# Patient Record
Sex: Female | Born: 1950 | Race: White | Hispanic: No | Smoking: Current some day smoker
Health system: Southern US, Community
[De-identification: ages and names within clinical notes are randomized; demographics above are authoritative.]

## PROBLEM LIST (undated history)

## (undated) DIAGNOSIS — K5792 Diverticulitis of intestine, part unspecified, without perforation or abscess without bleeding: Secondary | ICD-10-CM

## (undated) DIAGNOSIS — I255 Ischemic cardiomyopathy: Secondary | ICD-10-CM

## (undated) DIAGNOSIS — G4700 Insomnia, unspecified: Secondary | ICD-10-CM

## (undated) DIAGNOSIS — I714 Abdominal aortic aneurysm, without rupture: Secondary | ICD-10-CM

## (undated) DIAGNOSIS — Z720 Tobacco use: Secondary | ICD-10-CM

## (undated) DIAGNOSIS — R569 Unspecified convulsions: Secondary | ICD-10-CM

## (undated) DIAGNOSIS — E785 Hyperlipidemia, unspecified: Secondary | ICD-10-CM

## (undated) DIAGNOSIS — N289 Disorder of kidney and ureter, unspecified: Secondary | ICD-10-CM

## (undated) DIAGNOSIS — I10 Essential (primary) hypertension: Secondary | ICD-10-CM

## (undated) DIAGNOSIS — K769 Liver disease, unspecified: Secondary | ICD-10-CM

## (undated) DIAGNOSIS — K922 Gastrointestinal hemorrhage, unspecified: Secondary | ICD-10-CM

## (undated) DIAGNOSIS — M1990 Unspecified osteoarthritis, unspecified site: Secondary | ICD-10-CM

## (undated) DIAGNOSIS — F32A Depression, unspecified: Secondary | ICD-10-CM

## (undated) DIAGNOSIS — F419 Anxiety disorder, unspecified: Secondary | ICD-10-CM

## (undated) DIAGNOSIS — K589 Irritable bowel syndrome without diarrhea: Secondary | ICD-10-CM

## (undated) DIAGNOSIS — L309 Dermatitis, unspecified: Secondary | ICD-10-CM

## (undated) DIAGNOSIS — K219 Gastro-esophageal reflux disease without esophagitis: Principal | ICD-10-CM

## (undated) DIAGNOSIS — F329 Major depressive disorder, single episode, unspecified: Secondary | ICD-10-CM

## (undated) DIAGNOSIS — I2510 Atherosclerotic heart disease of native coronary artery without angina pectoris: Secondary | ICD-10-CM

## (undated) DIAGNOSIS — F17200 Nicotine dependence, unspecified, uncomplicated: Secondary | ICD-10-CM

## (undated) HISTORY — DX: Hyperlipidemia, unspecified: E78.5

## (undated) HISTORY — DX: Gastro-esophageal reflux disease without esophagitis: K21.9

## (undated) HISTORY — PX: CARDIAC CATHETERIZATION: SHX172

## (undated) HISTORY — PX: COLONOSCOPY: SHX174

## (undated) HISTORY — PX: ABDOMINAL HYSTERECTOMY: SHX81

## (undated) HISTORY — DX: Nicotine dependence, unspecified, uncomplicated: F17.200

## (undated) SURGERY — LEFT HEART CATHETERIZATION WITH CORONARY ANGIOGRAM
Anesthesia: Moderate Sedation

---

## 2011-05-06 DIAGNOSIS — I509 Heart failure, unspecified: Secondary | ICD-10-CM

## 2011-05-06 DIAGNOSIS — R0602 Shortness of breath: Secondary | ICD-10-CM

## 2011-05-07 DIAGNOSIS — I5021 Acute systolic (congestive) heart failure: Secondary | ICD-10-CM

## 2011-05-10 DIAGNOSIS — R079 Chest pain, unspecified: Secondary | ICD-10-CM

## 2011-05-11 DIAGNOSIS — I219 Acute myocardial infarction, unspecified: Secondary | ICD-10-CM

## 2011-05-12 ENCOUNTER — Encounter (HOSPITAL_COMMUNITY)
Admission: RE | Disposition: A | Discharge status: Home / Self Care | Payer: Self-pay | Source: Ambulatory Visit | Attending: Cardiovascular Disease

## 2011-05-12 ENCOUNTER — Inpatient Hospital Stay (HOSPITAL_COMMUNITY)
Admission: RE | Admit: 2011-05-12 | Discharge: 2011-05-14 | DRG: 248 | Disposition: A | Discharge status: Home / Self Care | Payer: Medicaid Other | Source: Ambulatory Visit | Attending: Cardiovascular Disease | Admitting: Cardiovascular Disease

## 2011-05-12 ENCOUNTER — Encounter (HOSPITAL_COMMUNITY): Payer: Self-pay

## 2011-05-12 ENCOUNTER — Inpatient Hospital Stay
Admission: RE | Admit: 2011-05-12 | Payer: Self-pay | Source: Other Acute Inpatient Hospital | Admitting: Cardiovascular Disease

## 2011-05-12 DIAGNOSIS — I5021 Acute systolic (congestive) heart failure: Secondary | ICD-10-CM | POA: Diagnosis present

## 2011-05-12 DIAGNOSIS — I2510 Atherosclerotic heart disease of native coronary artery without angina pectoris: Principal | ICD-10-CM | POA: Insufficient documentation

## 2011-05-12 DIAGNOSIS — E785 Hyperlipidemia, unspecified: Secondary | ICD-10-CM | POA: Diagnosis present

## 2011-05-12 DIAGNOSIS — K909 Intestinal malabsorption, unspecified: Secondary | ICD-10-CM | POA: Diagnosis present

## 2011-05-12 DIAGNOSIS — K589 Irritable bowel syndrome without diarrhea: Secondary | ICD-10-CM | POA: Diagnosis present

## 2011-05-12 DIAGNOSIS — F17200 Nicotine dependence, unspecified, uncomplicated: Secondary | ICD-10-CM | POA: Diagnosis present

## 2011-05-12 DIAGNOSIS — L259 Unspecified contact dermatitis, unspecified cause: Secondary | ICD-10-CM | POA: Diagnosis present

## 2011-05-12 DIAGNOSIS — I509 Heart failure, unspecified: Secondary | ICD-10-CM | POA: Insufficient documentation

## 2011-05-12 DIAGNOSIS — G4700 Insomnia, unspecified: Secondary | ICD-10-CM | POA: Diagnosis present

## 2011-05-12 DIAGNOSIS — K5900 Constipation, unspecified: Secondary | ICD-10-CM | POA: Diagnosis present

## 2011-05-12 DIAGNOSIS — M1990 Unspecified osteoarthritis, unspecified site: Secondary | ICD-10-CM | POA: Diagnosis present

## 2011-05-12 DIAGNOSIS — I200 Unstable angina: Secondary | ICD-10-CM

## 2011-05-12 DIAGNOSIS — F341 Dysthymic disorder: Secondary | ICD-10-CM | POA: Diagnosis present

## 2011-05-12 DIAGNOSIS — I10 Essential (primary) hypertension: Secondary | ICD-10-CM | POA: Diagnosis present

## 2011-05-12 DIAGNOSIS — M109 Gout, unspecified: Secondary | ICD-10-CM | POA: Diagnosis present

## 2011-05-12 HISTORY — PX: LEFT HEART CATHETERIZATION WITH CORONARY ANGIOGRAM: SHX5451

## 2011-05-12 HISTORY — DX: Dermatitis, unspecified: L30.9

## 2011-05-12 HISTORY — DX: Gastrointestinal hemorrhage, unspecified: K92.2

## 2011-05-12 HISTORY — DX: Major depressive disorder, single episode, unspecified: F32.9

## 2011-05-12 HISTORY — DX: Anxiety disorder, unspecified: F41.9

## 2011-05-12 HISTORY — DX: Irritable bowel syndrome, unspecified: K58.9

## 2011-05-12 HISTORY — DX: Insomnia, unspecified: G47.00

## 2011-05-12 HISTORY — DX: Atherosclerotic heart disease of native coronary artery without angina pectoris: I25.10

## 2011-05-12 HISTORY — DX: Unspecified osteoarthritis, unspecified site: M19.90

## 2011-05-12 HISTORY — DX: Diverticulitis of intestine, part unspecified, without perforation or abscess without bleeding: K57.92

## 2011-05-12 HISTORY — DX: Depression, unspecified: F32.A

## 2011-05-12 HISTORY — DX: Essential (primary) hypertension: I10

## 2011-05-12 SURGERY — LEFT HEART CATHETERIZATION WITH CORONARY ANGIOGRAM
Anesthesia: LOCAL

## 2011-05-12 MED ORDER — DIAZEPAM 5 MG PO TABS
ORAL_TABLET | ORAL | Status: AC
  Filled 2011-05-12: qty 1

## 2011-05-12 MED ORDER — SODIUM CHLORIDE 0.9 % IJ SOLN
3.0000 mL | Freq: Two times a day (BID) | INTRAMUSCULAR | Status: DC
  Administered 2011-05-12 – 2011-05-13 (×3): 3 mL via INTRAVENOUS

## 2011-05-12 MED ORDER — NITROGLYCERIN 0.2 MG/ML ON CALL CATH LAB
INTRAVENOUS | Status: AC
  Filled 2011-05-12: qty 1

## 2011-05-12 MED ORDER — FUROSEMIDE 20 MG PO TABS
20.0000 mg | ORAL_TABLET | Freq: Every day | ORAL | Status: DC
  Administered 2011-05-13: 20 mg via ORAL
  Filled 2011-05-12 (×2): qty 1

## 2011-05-12 MED ORDER — ATORVASTATIN CALCIUM 40 MG PO TABS
40.0000 mg | ORAL_TABLET | Freq: Every day | ORAL | Status: DC
  Administered 2011-05-12 – 2011-05-13 (×2): 40 mg via ORAL
  Filled 2011-05-12 (×3): qty 1

## 2011-05-12 MED ORDER — SODIUM CHLORIDE 0.9 % IV SOLN: 0.2500 mg/kg/h | INTRAVENOUS | Status: DC

## 2011-05-12 MED ORDER — DIAZEPAM 5 MG PO TABS
5.0000 mg | ORAL_TABLET | Freq: Once | ORAL | Status: AC
  Administered 2011-05-12: 5 mg via ORAL

## 2011-05-12 MED ORDER — ZOLPIDEM TARTRATE 5 MG PO TABS
5.0000 mg | ORAL_TABLET | Freq: Every evening | ORAL | Status: DC | PRN
  Administered 2011-05-12 – 2011-05-13 (×2): 5 mg via ORAL
  Filled 2011-05-12 (×2): qty 1

## 2011-05-12 MED ORDER — HEPARIN (PORCINE) IN NACL 2-0.9 UNIT/ML-% IJ SOLN
INTRAMUSCULAR | Status: AC
  Filled 2011-05-12: qty 2000

## 2011-05-12 MED ORDER — CLOPIDOGREL BISULFATE 300 MG PO TABS
ORAL_TABLET | ORAL | Status: AC
  Filled 2011-05-12: qty 2

## 2011-05-12 MED ORDER — BIVALIRUDIN 250 MG IV SOLR
INTRAVENOUS | Status: AC
  Filled 2011-05-12: qty 250

## 2011-05-12 MED ORDER — CARVEDILOL 3.125 MG PO TABS
3.1250 mg | ORAL_TABLET | Freq: Two times a day (BID) | ORAL | Status: DC
  Administered 2011-05-12 – 2011-05-13 (×3): 3.125 mg via ORAL
  Filled 2011-05-12 (×6): qty 1

## 2011-05-12 MED ORDER — ONDANSETRON HCL 4 MG/2ML IJ SOLN: 4.0000 mg | Freq: Four times a day (QID) | INTRAMUSCULAR | Status: DC | PRN

## 2011-05-12 MED ORDER — CLOPIDOGREL BISULFATE 75 MG PO TABS
75.0000 mg | ORAL_TABLET | Freq: Every day | ORAL | Status: DC
  Administered 2011-05-13: 75 mg via ORAL
  Filled 2011-05-12: qty 1

## 2011-05-12 MED ORDER — NICOTINE 21 MG/24HR TD PT24
21.0000 mg | MEDICATED_PATCH | Freq: Every day | TRANSDERMAL | Status: DC
  Administered 2011-05-12 – 2011-05-13 (×2): 21 mg via TRANSDERMAL
  Filled 2011-05-12 (×3): qty 1

## 2011-05-12 MED ORDER — FLUOXETINE HCL 20 MG PO CAPS
20.0000 mg | ORAL_CAPSULE | Freq: Every day | ORAL | Status: DC
  Administered 2011-05-13: 20 mg via ORAL
  Filled 2011-05-12 (×2): qty 1

## 2011-05-12 MED ORDER — OXYCODONE-ACETAMINOPHEN 5-325 MG PO TABS: 1.0000 | ORAL_TABLET | ORAL | Status: DC | PRN

## 2011-05-12 MED ORDER — MIDAZOLAM HCL 2 MG/2ML IJ SOLN
INTRAMUSCULAR | Status: AC
  Filled 2011-05-12: qty 2

## 2011-05-12 MED ORDER — LIDOCAINE HCL (PF) 1 % IJ SOLN
INTRAMUSCULAR | Status: AC
  Filled 2011-05-12: qty 30

## 2011-05-12 MED ORDER — ADENOSINE 12 MG/4ML IV SOLN
12.0000 mL | Freq: Once | INTRAVENOUS | Status: DC
  Filled 2011-05-12: qty 12

## 2011-05-12 MED ORDER — FENTANYL CITRATE 0.05 MG/ML IJ SOLN
INTRAMUSCULAR | Status: AC
  Filled 2011-05-12: qty 2

## 2011-05-12 MED ORDER — VERAPAMIL HCL 2.5 MG/ML IV SOLN
INTRAVENOUS | Status: AC
  Filled 2011-05-12: qty 2

## 2011-05-12 MED ORDER — SODIUM CHLORIDE 0.9 % IV SOLN
INTRAVENOUS | Status: AC
  Administered 2011-05-12: 17:00:00 via INTRAVENOUS

## 2011-05-12 MED ORDER — HEPARIN SODIUM (PORCINE) 1000 UNIT/ML IJ SOLN
INTRAMUSCULAR | Status: AC
  Filled 2011-05-12: qty 1

## 2011-05-12 MED ORDER — ASPIRIN 81 MG PO CHEW
81.0000 mg | CHEWABLE_TABLET | Freq: Every day | ORAL | Status: DC
  Administered 2011-05-12 – 2011-05-13 (×2): 81 mg via ORAL
  Filled 2011-05-12 (×2): qty 1

## 2011-05-12 MED ORDER — FAMOTIDINE IN NACL 20-0.9 MG/50ML-% IV SOLN
INTRAVENOUS | Status: AC
  Filled 2011-05-12: qty 50

## 2011-05-12 NOTE — Progress Notes (Signed)
TR BAND REMOVAL  LOCATION:    right radial  DEFLATED PER PROTOCOL:    yes  TIME BAND OFF / DRESSING APPLIED:    2215   SITE UPON ARRIVAL:    Level 0  SITE AFTER BAND REMOVAL:    Level 0  REVERSE ALLEN'S TEST:     positive  CIRCULATION SENSATION AND MOVEMENT:    Within Normal Limits   yes  COMMENTS:   Patient verbalized understanding of rationale for limiting use of right upper extremity as well as prompt notification of RN with any changes in site or pain/discomfort at site.

## 2011-05-12 NOTE — CV Procedure (Addendum)
Cardiac Catheterization Procedure Note  Name: Carla Hoover MRN: [id number] DOB: 12-06-1950  Procedure: Left Heart Cath, Selective Coronary Angiography , flow wire interrogation of the left anterior descending artery, PTCA and stenting of the proximal left anterior descending artery with a bare-metal stent.   Indication: This is a 61 year old female who presented to [facility] hospital with generalized aching, hypokalemia and some blood in the stool. She went into heart failure after she was given IV fluids. Her EF was found to be 40-45% by echocardiogram. Her cardiac enzymes were only slightly elevated. She improved with IV furosemide. She started having substernal chest discomfort with anterior T wave changes. She was thus transferred for cardiac catheterization.  Medications:  Sedation:  1 mg IV Versed, 25 mcg IV Fentanyl  Contrast:  150 mL Omnipaque  Procedural Details: The right wrist was prepped, draped, and anesthetized with 1% lidocaine. Using the modified Seldinger technique, a 5 French sheath was introduced into the right radial artery. 3 mg of verapamil was administered through the sheath, weight-based unfractionated heparin was administered intravenously. A standard Judkins diagnostic catheters was used for selective coronary angiography. A pigtail catheter was to record left ventricular pressure without angiography. Catheter exchanges were performed over an exchange length guidewire. There were no immediate procedural complications.  Procedural Findings:  Hemodynamics: AO:  139/57   mmHg LV:  137/2   mmHg LVEDP: 5  mmHg  Coronary angiography: Coronary dominance: Right   Left Main:  Normal in size and mildly calcified. There is a 20% ostial stenosis.  Left Anterior Descending (LAD):  The vessel is normal in size and moderately calcified especially in the proximal and mid segment. There is mild 20% tubular stenosis proximally closer the ostium. That's followed by a 70% calcified  eccentric stenosis just before the first septal perforator and at the origin of first diagonal branch. The distal LAD has diffuse nonobstructive atherosclerosis.  1st diagonal (D1):  Normal in size with minor irregularities.  2nd diagonal (D2):  Normal in size with minor irregularities.  3rd diagonal (D3):  Very small in size.  Circumflex (LCx):  The vessel is normal in size and nondominant. It is subtotally occluded in the midsegment. After the occlusion it supplies a normal size OM. The vessel gets some antegrade collaterals as well as right to left collaterals.  1st obtuse marginal:  Small in size and free of significant disease.  2nd obtuse marginal:  Normal in size and fills via collaterals.  3rd obtuse marginal:  Very small in size.   Right Coronary Artery: The vessel is normal in size and dominant. There is a diffuse 40% stenosis proximally as well as 30-40% disease in the midsegment. The rest of the vessel has minor irregularities without evidence of obstructive disease.  posterior descending artery: Normal in size with minor irregularities.  posterior lateral branch: 2 medium-sized posterolateral branches which are free of significant disease.  Left ventriculography: Was not performed. EF was 40-45% by recent echocardiogram.  PCI Note:  Following the diagnostic procedure, the decision was made to proceed with PCI. The radial sheath was upsized to a 6 [place]. Weight-based bivalirudin was given for anticoagulation. Once a therapeutic ACT was achieved, a 6 [place] XB 3.0 guide catheter was inserted.  However, it did not fit very well and the pressure kept dampening with engagement. Thus, I decided to switch to a JL 3.5 guiding catheter.  A prime FloWire  was used to cross the lesion.  After zeroing and normalizing. Intravenous adenosine was started at  140 mcg per kilogram per minute. Pressure was recorded. FFR ratio was 0.71. I decided to proceed with PCI. The lesion was predilated with a  2.5 x 8 balloon.  The lesion was then stented with a 3.0 x 12 mm integrity bare-metal stent. Unfortunately, during the deployment the stent moved backward which was obvious under fluoroscopy which led that the lesion was not completely covered distally. Thus, I decided to use another stent which was 2.75 x 8 mm integrity bare-metal stent. The overlap area was postdilated with the stent balloon. The stented area was postdilated with a 3.25X8 mm noncompliant balloon.  Following PCI, there was 10% % residual stenosis and TIMI-3 flow. Final angiography confirmed an excellent result. The patient tolerated the procedure well. There were no immediate procedural complications. A TR band was used for radial hemostasis. The patient was transferred to the post catheterization recovery area for further monitoring.  PCI Data: Vessel - proximal LAD/Segment - 12 Percent Stenosis (pre)  70% TIMI-flow 3 Stent 3.0 x 12 mm overlap with a 2.75 x 8 mm integrity bare-metal stent. Post dilated with a 3.25 [place] balloon.  Percent Stenosis (post) 10% TIMI-flow (post) 3   Final Conclusions:  1. Significant two-vessel coronary artery disease. The mid left circumflex appears to be chronically occluded with collaterals. The vessel diameter appears to be less than 2.5 mm. The LAD stenosis was interrogated with a pressure wire. FFR ratio was 0.71 indicating a flow limiting physiology. 2. Normal LVEDP. 3. Successful angioplasty and 2 overlap bare-metal stent placement to the proximal LAD. A second stent was placed due to the first stent moving backward during deployment.  Recommendations:  The left circumflex occlusion appears to be chronic. Given that the vessel diameter appears to be 2.5 mm or less, I decided not to proceed with PCI of that vessel because of uncertainty in being able to place a drug-eluting stent in the setting of recent mild GI bleed. For the same reason, I chose to put a bare-metal stent in the left anterior  descending artery. I recommend aggressive medical therapy. She will need treatment for heart failure and likely a small dose diuretic before discharge. Dual antiplatelet therapy is recommended for a minimal of one month and ideally 3-12 months.  Lorine Bears MD, [facility] 05/12/2011, 3:52 PM

## 2011-05-12 NOTE — H&P (Signed)
  History and Physical   Patient ID: Carla Hoover MRN: [id number] DOB/AGE: November 22, 1950 61 y.o. Admit date: 05/12/2011  Please see my dictated consult on 05/06/2011 at [facility] (included in paper chart). This is a summery.    HPI: This is a 61 year old female who presented to [facility] hospital on February 19 with generalized aching and pain in joints and muscles. She also had slight blood in stool with some diarrhea. She was found to be significantly hypokalemic. She was treated with IV fluids but shortly after she went into respiratory distress which was due to pulmonary edema and congestive heart failure. She had no previous history of congestive heart failure. She improved with IV Lasix and did not require intubation. She had an echocardiogram done which showed mildly reduced LV systolic function with multiple wall motion abnormalities. She started having chest pain described as tightness with some anterior T wave changes in her EKG. She has multiple risk factors for CAD that include hypertension, hyperlipidemia and tobacco use. Due to her recurrent chest pain and first presentation with heart failure as well as multiple wall motion abnormalities and her echocardiogram, I recommended proceeding with cardiac catheterization and possible coronary intervention.  Review of systems complete and found to be negative unless listed above     EKG: Normal sinus rhythm with T-wave inversion in the anterior leads.  ASSESSMENT AND PLAN:  1. recurrent chest pain with EKG changes suggestive of anterior ischemia. Multiple risk factors for coronary artery disease. Recommend proceeding with cardiac catheterization and possible PCI today. If PCI is needed, would likely favor a bare-metal stent due to recent mild lower GI bleed. 2. congestive heart failure with recent pulmonary edema: EF was 40-45% by echo. She has improved significantly. We'll need to start a beta blocker and ACE inhibitor if blood  pressure allows. 3. Hypokalemia: Resolved. 4. Mild lower GI bleed: She was seen by Dr. Teena Dunk at [place]. No EGD or colonoscopy was recommended at that time. It appears that she likely has malabsorption.  Signed: Lorine Bears 05/12/2011, 1:59 PM Co-Sign MD

## 2011-05-13 ENCOUNTER — Other Ambulatory Visit: Payer: Self-pay

## 2011-05-13 DIAGNOSIS — I200 Unstable angina: Secondary | ICD-10-CM

## 2011-05-13 LAB — CBC
MCH: 39.6 pg — ABNORMAL HIGH (ref 26.0–34.0)
MCHC: 34.4 g/dL (ref 30.0–36.0)
MCV: 115.1 fL — ABNORMAL HIGH (ref 78.0–100.0)
Platelets: 258 10*3/uL (ref 150–400)
RBC: 2.98 MIL/uL — ABNORMAL LOW (ref 3.87–5.11)
RDW: 14.5 % (ref 11.5–15.5)

## 2011-05-13 LAB — BASIC METABOLIC PANEL
Calcium: 9 mg/dL (ref 8.4–10.5)
Creatinine, Ser: 1.12 mg/dL — ABNORMAL HIGH (ref 0.50–1.10)
GFR calc non Af Amer: 51 mL/min — ABNORMAL LOW (ref >90)
Sodium: 130 mEq/L — ABNORMAL LOW (ref 135–145)

## 2011-05-13 MED ORDER — ACETAMINOPHEN 325 MG PO TABS
650.0000 mg | ORAL_TABLET | ORAL | Status: DC | PRN
  Administered 2011-05-13: 650 mg via ORAL
  Filled 2011-05-13: qty 2

## 2011-05-13 MED ORDER — HYDROCODONE-ACETAMINOPHEN 5-325 MG PO TABS
1.0000 | ORAL_TABLET | Freq: Four times a day (QID) | ORAL | Status: DC | PRN
  Administered 2011-05-13: 2 via ORAL
  Filled 2011-05-13: qty 2

## 2011-05-13 MED ORDER — MAGNESIUM HYDROXIDE 400 MG/5ML PO SUSP
30.0000 mL | Freq: Every day | ORAL | Status: DC | PRN
  Administered 2011-05-13: 30 mL via ORAL
  Filled 2011-05-13: qty 30

## 2011-05-13 MED ORDER — LORAZEPAM 0.5 MG PO TABS
1.0000 mg | ORAL_TABLET | Freq: Four times a day (QID) | ORAL | Status: DC | PRN
  Administered 2011-05-13 – 2011-05-14 (×2): 1 mg via ORAL
  Filled 2011-05-13 (×2): qty 2

## 2011-05-13 MED ORDER — LISINOPRIL 5 MG PO TABS
5.0000 mg | ORAL_TABLET | Freq: Every day | ORAL | Status: DC
  Administered 2011-05-13: 5 mg via ORAL
  Filled 2011-05-13 (×2): qty 1

## 2011-05-13 MED ORDER — LACTULOSE 10 GM/15ML PO SOLN
20.0000 g | Freq: Two times a day (BID) | ORAL | Status: DC | PRN
  Filled 2011-05-13: qty 30

## 2011-05-13 MED FILL — Dextrose Inj 5%: INTRAVENOUS | Qty: 50 | Status: AC

## 2011-05-13 NOTE — Progress Notes (Signed)
CARDIAC REHAB PHASE I   PRE:  Rate/Rhythm: 91 SR  BP:  Supine:   Sitting: 110/66  Standing:    SaO2: 98 RA  MODE:  Ambulation: 340 ft   POST:  Rate/Rhythem: 99 SR  BP:  Supine:   Sitting: 99/61  Standing:    SaO2:  0815-0920 Assisted X 1 and used walker to ambulate. Gait steady with walker, slow pace.VS stable. Completed discharge education with pt.Discussed CHF, stent and MI. She declines Outpt. due to no insurance. Will ask case manger to see pt due to no insurance and need for help with medications.  Carla Hoover

## 2011-05-13 NOTE — Progress Notes (Signed)
   CARE MANAGEMENT NOTE 05/13/2011  Patient:  MARSI, TURVEY   Account Number:  [id number]  Date Initiated:  05/13/2011  Documentation initiated by:  GRAVES-BIGELOW,Priya Matsen  Subjective/Objective Assessment:   Pt admitted with cp. Plan to d/c on plavix. CM discussed that some meds would be cheaper at [facility] like coreg and lisinopril and to get plavix at [facility] for 23.31.     Action/Plan:   Pt has PCP in [place] DR.Dhruvyas and pays out of pocket. CM did call [facility] for assistance with bill and to question if she is eligible for medicaid.   Anticipated DC Date:  05/13/2011   Anticipated DC Plan:  HOME/SELF CARE      DC Planning Services  CM consult      Choice offered to / List presented to:             Status of service:  Completed, signed off Medicare Important Message given?   (If response is "NO", the following Medicare IM given date fields will be blank) Date Medicare IM given:   Date Additional Medicare IM given:    Discharge Disposition:  HOME/SELF CARE  Per [facility] Regulation:    Comments:  05-13-11 [address] RN,BSN [phone] No other needs by CM at this time.

## 2011-05-13 NOTE — Progress Notes (Signed)
    SUBJECTIVE:  Only complaint this am is constipation. No chest pain or SOB.   BP 127/63  Pulse 75  Temp(Src) 97.9 F (36.6 C) (Oral)  Resp 19  Ht 5' 3.5" (1.613 m)  Wt 151 lb 3.8 oz (68.6 kg)  BMI 26.37 kg/m2  SpO2 98%  Intake/Output Summary (Last 24 hours) at 05/13/11 0710 Last data filed at 05/13/11 0400  Gross per 24 hour  Intake    870 ml  Output    100 ml  Net    770 ml    PHYSICAL EXAM General: Well developed, well nourished, in no acute distress. Alert and oriented x 3.  Psych:  Good affect, responds appropriately Neck: No JVD. No masses noted.  Lungs: Clear bilaterally with no wheezes or rhonci noted.  Heart: RRR with no murmurs noted. Abdomen: Bowel sounds are present. Soft, non-tender.  Extremities: No lower extremity edema.   LABS: Basic Metabolic Panel:  Basename 05/13/11 0420  NA 130*  K 4.3  CL 95*  CO2 24  GLUCOSE 104*  BUN 23  CREATININE 1.12*  CALCIUM 9.0  MG --  PHOS --   CBC:  Basename 05/13/11 0420  WBC 8.8  NEUTROABS --  HGB 11.8*  HCT 34.3*  MCV 115.1*  PLT 258    Current Meds:    . aspirin  81 mg Oral Daily  . atorvastatin  40 mg Oral q1800  . bivalirudin      . carvedilol  3.125 mg Oral BID WC  . clopidogrel      . clopidogrel  75 mg Oral Q breakfast  . diazepam  5 mg Oral Once  . famotidine      . fentaNYL      . FLUoxetine  20 mg Oral Daily  . furosemide  20 mg Oral Daily  . heparin      . heparin      . lidocaine      . midazolam      . nicotine  21 mg Transdermal Daily  . nitroGLYCERIN      . sodium chloride  3 mL Intravenous Q12H  . verapamil      . DISCONTD: adenosine  12 mL Intravenous Once     ASSESSMENT AND PLAN:  1. CAD: pt admitted yesterday on transfer from [facility] for unstable angina. Dr.Arida placed two overlapping bare metal stents in the LAD. She is doing well. Will need dual antiplatelet therapy for at least one month but preferably longer than three months if tolerated. Continue  ASA, Plavix, statin, beta blocker.   2. Acute systolic CHF: LVEF is 40-45% by echo at [facility].  She has improved significantly with diuresis. Will continue beta blocker and will start low dose Ace-inh today (Lisinopril 5mg  po Qdaily).    3. Hypokalemia: Resolved.   4. Mild lower GI bleed: She was seen by Dr. Teena Dunk at [place]. No EGD or colonoscopy was recommended at that time. It appears that she likely has malabsorption. H/H stable.   5. Constipation: No bowel movement in 8 days. She was given Milk of magnesia this am. If no BM by noon, will have pt take Lactulose. This will prevent her discharge home today.      Carla Hoover  2/28/20137:10 AM

## 2011-05-14 ENCOUNTER — Encounter (HOSPITAL_COMMUNITY): Payer: Self-pay | Admitting: Physician Assistant

## 2011-05-14 DIAGNOSIS — I509 Heart failure, unspecified: Secondary | ICD-10-CM | POA: Insufficient documentation

## 2011-05-14 DIAGNOSIS — I2510 Atherosclerotic heart disease of native coronary artery without angina pectoris: Secondary | ICD-10-CM | POA: Insufficient documentation

## 2011-05-14 HISTORY — PX: CORONARY ANGIOPLASTY: SHX604

## 2011-05-14 LAB — CBC
Hemoglobin: 11.8 g/dL — ABNORMAL LOW (ref 12.0–15.0)
MCH: 40 pg — ABNORMAL HIGH (ref 26.0–34.0)
MCHC: 35.6 g/dL (ref 30.0–36.0)
Platelets: 232 10*3/uL (ref 150–400)
RBC: 2.95 MIL/uL — ABNORMAL LOW (ref 3.87–5.11)

## 2011-05-14 LAB — BASIC METABOLIC PANEL
Calcium: 9.5 mg/dL (ref 8.4–10.5)
GFR calc Af Amer: 52 mL/min — ABNORMAL LOW (ref >90)
GFR calc non Af Amer: 45 mL/min — ABNORMAL LOW (ref >90)
Potassium: 4.5 mEq/L (ref 3.5–5.1)
Sodium: 130 mEq/L — ABNORMAL LOW (ref 135–145)

## 2011-05-14 MED ORDER — LORAZEPAM 1 MG PO TABS: 1.0000 mg | ORAL_TABLET | Freq: Four times a day (QID) | ORAL | Status: AC | PRN

## 2011-05-14 MED ORDER — FUROSEMIDE 20 MG PO TABS: 20.0000 mg | ORAL_TABLET | Freq: Every day | ORAL | Status: DC

## 2011-05-14 MED ORDER — PRAVASTATIN SODIUM 40 MG PO TABS: 40.0000 mg | ORAL_TABLET | Freq: Every day | ORAL | Status: DC

## 2011-05-14 MED ORDER — ASPIRIN 81 MG PO TBEC: 81.0000 mg | DELAYED_RELEASE_TABLET | Freq: Every day | ORAL | Status: AC

## 2011-05-14 MED ORDER — NITROGLYCERIN 0.4 MG SL SUBL: 0.4000 mg | SUBLINGUAL_TABLET | SUBLINGUAL | Status: DC | PRN

## 2011-05-14 MED ORDER — CARVEDILOL 3.125 MG PO TABS: 3.1250 mg | ORAL_TABLET | Freq: Two times a day (BID) | ORAL | Status: DC

## 2011-05-14 MED ORDER — CLOPIDOGREL BISULFATE 75 MG PO TABS: 75.0000 mg | ORAL_TABLET | Freq: Every day | ORAL | Status: DC

## 2011-05-14 MED ORDER — CITALOPRAM HYDROBROMIDE 20 MG PO TABS: 20.0000 mg | ORAL_TABLET | Freq: Every day | ORAL | Status: DC

## 2011-05-14 NOTE — Discharge Summary (Signed)
Full note written this am. cdm 

## 2011-05-14 NOTE — Progress Notes (Signed)
    SUBJECTIVE: No chest pain or events overnight. No SOB. Had a good BM yesterday.   BP 108/68  Pulse 83  Temp(Src) 97.7 F (36.5 C) (Oral)  Resp 20  Ht 5' 3.5" (1.613 m)  Wt 146 lb 13.2 oz (66.6 kg)  BMI 25.60 kg/m2  SpO2 96%  Intake/Output Summary (Last 24 hours) at 05/14/11 0719 Last data filed at 05/13/11 2000  Gross per 24 hour  Intake    480 ml  Output      0 ml  Net    480 ml    PHYSICAL EXAM General: Well developed, well nourished, in no acute distress. Alert and oriented x 3.  Psych:  Good affect, responds appropriately Neck: No JVD. No masses noted.  Lungs: Clear bilaterally with no wheezes or rhonci noted.  Heart: RRR with no murmurs noted. Abdomen: Bowel sounds are present. Soft, non-tender.  Extremities: No lower extremity edema. Right wrist cath site ok.   LABS: Basic Metabolic Panel:  Basename 05/14/11 0445 05/13/11 0420  NA 130* 130*  K 4.5 4.3  CL 97 95*  CO2 23 24  GLUCOSE 101* 104*  BUN 23 23  CREATININE 1.24* 1.12*  CALCIUM 9.5 9.0  MG -- --  PHOS -- --   CBC:  Basename 05/14/11 0445 05/13/11 0420  WBC 9.5 8.8  NEUTROABS -- --  HGB 11.8* 11.8*  HCT 33.1* 34.3*  MCV 112.2* 115.1*  PLT 232 258    Current Meds:    . aspirin  81 mg Oral Daily  . atorvastatin  40 mg Oral q1800  . carvedilol  3.125 mg Oral BID WC  . clopidogrel  75 mg Oral Q breakfast  . FLUoxetine  20 mg Oral Daily  . furosemide  20 mg Oral Daily  . lisinopril  5 mg Oral Daily  . nicotine  21 mg Transdermal Daily  . sodium chloride  3 mL Intravenous Q12H     ASSESSMENT AND PLAN:  1. CAD: pt transferred from [facility] for unstable angina. Dr. Kirke Corin placed two overlapping bare metal stents in the LAD on 05/12/11. She is doing well. Will need dual antiplatelet therapy for at least one month but preferably longer than three months if tolerated. Continue ASA, Plavix, statin, beta blocker.   2. Acute systolic CHF: LVEF is 40-45% by echo at [facility]. She has  improved significantly with diuresis. Will continue beta blocker low dose Ace-inhibitor (Lisinopril 5mg  po Qdaily).   3. Hypokalemia: Resolved.   4. Mild lower GI bleed: She was seen by Dr. Teena Dunk at [place]. No EGD or colonoscopy was recommended at that time. It appears that she likely has malabsorption. H/H stable.   5. Constipation: Resolved. Pt had large bowel movement yesterday.   6. Tobacco Abuse: She has stopped smoking. She would like to use nicotine patches.   7. Depression: Will change to Celexa with the interaction with Plavix/Prozac.   8. Dispo: D/c home today. Follow up with Dr. Kirke Corin in [facility] office in 2-3 weeks.      Carla Hoover  3/1/20137:19 AM

## 2011-05-14 NOTE — Discharge Summary (Signed)
Discharge Summary   Patient ID: Carla Hoover MRN: [id number], DOB/AGE: 1950/10/11 61 y.o. Admit date: 05/12/2011 D/C date:     05/14/2011   Primary Discharge Diagnoses:  1. Newly diagnosed CAD - s/p two overlapping BMS to LAD - will need f/u LFTs/lipids in 6 weeks since started on statin 2. Acute respiratory failure likely due to CHF - EF 40-45% by echo at [facility] - ACEI on hold secondary to soft blood pressures 3. Hypokalemia, resolved 4. Possible recent mild GI bleed, hemoccult negative at [facility] (seen by GI there - outpatient f/u PRN) 5. Generalized gout versus other etiology for multi-joint arthralgia 6. Constipation, resolved  Secondary Discharge Diagnoses:  1. HTN 2. Insomnia 3. Depression/anxiety 4. Eczematous dermatitis 5. Osteoarthritis 6. IBS  7. Diverticulitis 8. Tobacco abuse  Hospital Course: Ms. Pryce is a 61 y/o F with hx of HTN, tobacco abuse who presented to [facility] 05/04/11 with complaints of arthralgia and slimy stool with possible blood in it. Hemoccult eventually reported as negative, seen by GI and recommended to see as outpatient as needed. No colonoscopy/EGD was felt necessary and due to hypoalbuminemia she was also felt to have a degree of malabsorption. While being treated for generalized gout with steroids and IV fluids, she went into respiratory distress requiring bipap with elevated BNP and d-dimer. CT angio demonstrated no PE but possible plulmonary edema so Lasix was given. EF was noted to be 40-45% by echo, and CT angio also demonstrated CAD. She started having chest pain described as tightness with some anterior T wave changes in her EKG and was therefore transferred to [facility] for evaluation. She underwent cath 05/12/11 demonstrating:  Final Conclusions:  1. Significant two-vessel coronary artery disease. The mid left circumflex appears to be chronically occluded with collaterals. The vessel diameter appears to be less than  2.5 mm. The LAD stenosis was interrogated with a pressure wire. FFR ratio was 0.71 indicating a flow limiting physiology.  2. Normal LVEDP.  3. Successful angioplasty and 2 overlap bare-metal stent placement to the proximal LAD. A second stent was placed due to the first stent moving backward during deployment.  Dr. Kirke Corin felt the Lcx appeared to be chronic. Given that the vessel diameter appeared to be 2.5 mm or less, he decided not to proceed with PCI of that vessel because of uncertainty in being able to place a drug-eluting stent in the setting of recent mild GI bleed.For the same reason, he chose to put a bare-metal stent in the left anterior descending artery. He recommended aggressive medical therapy. The patient was observed post-procedure. She tolerated this well from a cardiac standpoint, but had severe constipation so was kept in pending relief which occurred with the use of laxatives. Today she is feeling well. Dr. Clifton James has seen and examined her and feels she is stable for discharge. Please see below for complete med rec. Of note, pharmacy recommended to change Prozac due to interaction with Plavix and per discussion with Dr. Clifton James we will send her home with Celexa. Of note, the patient has issues with cost of medication so at discharge, lipitor was changed to Pravastatin. ACEI was held secondary to hypotension. She was also told to discuss Plavix with cardiologist before refilling it to determine if she needs to take it beyond 1 month (Dr. Clifton James recommended dual antiplatelet therapy for at least one month but preferably longer than three months if tolerated)  Discharge Vitals: Blood pressure 88/57, pulse 84, temperature 98.2 F (36.8 C),  temperature source Oral, resp. rate 20, height 5' 3.5" (1.613 m), weight 146 lb 13.2 oz (66.6 kg), SpO2 100.00%.  Labs: Lab Results  Component Value Date   WBC 9.5 05/14/2011   HGB 11.8* 05/14/2011   HCT 33.1* 05/14/2011   MCV 112.2* 05/14/2011   PLT  232 05/14/2011    Lab 05/14/11 0445  NA 130*  K 4.5  CL 97  CO2 23  BUN 23  CREATININE 1.24*  CALCIUM 9.5  PROT --  BILITOT --  ALKPHOS --  ALT --  AST --  GLUCOSE 101*    Diagnostic Studies/Procedures   1. 2D echo 05/06/11 - LV dysfunction EF 40-45%. E to A reversal in mitral valve flow pattern suggestive of diastolic dysfunction. The basal inferoseptal, mid anteroseptal, mid inferoseptal, apical anterior, apical lateral and apical inferior walls are hypokinetic. Trace MR. Mild TR. RVSP .  2. CXR 05/06/11 - asymmetric airspace disease may represent asymmetric pulmonary edema. Infectious infiltrate not excluded  3. LE dopplers - no evidence of DVT 05/07/11  4. CT angio 05/07/11 - no evidence of PE. Patchy ground glass and airspace disease favored to represent pulmonary edema. Atypical infxn less likely. Cardiomegaly and age advanced coronary/aortic atherosclerosis. Extensive ulcerative plaque throughout descending throacic aorta. Subtle hematic findings which mild cirrhosis cannot be exluded. Small bilat pleural effusions.   Discharge Medications   Medication List  As of 05/14/2011  8:16 AM   STOP taking these medications         FLUoxetine 20 MG capsule      metoprolol 50 MG tablet         TAKE these medications         aspirin 81 MG EC tablet   Take 1 tablet (81 mg total) by mouth daily.      carvedilol 3.125 MG tablet   Commonly known as: COREG   Take 1 tablet (3.125 mg total) by mouth 2 (two) times daily with a meal.      citalopram 20 MG tablet   Commonly known as: CELEXA   Take 1 tablet (20 mg total) by mouth daily.      clopidogrel 75 MG tablet   Commonly known as: PLAVIX   Take 1 tablet (75 mg total) by mouth daily with breakfast.      furosemide 20 MG tablet   Commonly known as: LASIX   Take 1 tablet (20 mg total) by mouth daily.      LORazepam 1 MG tablet   Commonly known as: ATIVAN   Take 1 tablet (1 mg total) by mouth every 6 (six) hours as  needed for anxiety.      nicotine 21 mg/24hr patch   Commonly known as: NICODERM CQ - dosed in mg/24 hours   Place 1 patch onto the skin daily. Remove old patch before applying new patch      nitroGLYCERIN 0.4 MG SL tablet   Commonly known as: NITROSTAT   Place 1 tablet (0.4 mg total) under the tongue every 5 (five) minutes as needed for chest pain (up to 3 doses).      pravastatin 40 MG tablet   Commonly known as: PRAVACHOL   Take 1 tablet (40 mg total) by mouth daily.          She was instructed to f/u with PCP closely given new rx of celexa. She was also told to discuss Plavix with cardiologist before refilling Plavix to determine if she needs to take it beyond 1 month (  Dr. Clifton James recommended dual antiplatelet therapy for at least one month but preferably longer than three months if tolerated)    Disposition   The patient will be discharged in stable condition to home. Discharge Orders    Future Appointments: Provider: Department: Dept Phone: Center:   05/28/2011 1:45 PM Iran Ouch, MD [facility] [phone] [facility]     Future Orders Please Complete By Expires   Diet - low sodium heart healthy      Increase activity slowly      Comments:   No driving for 2 days. No lifting over 5 lbs for 1 week. No sexual activity for 1 week. Keep procedure site clean & dry. If you notice increased pain, swelling, bleeding or pus, call/return!  You may shower, but no soaking baths/hot tubs/pools for 1 week.       Follow-up Information    Follow up with Lorine Bears, MD. (Our office will call you)    Contact information:   [facility] [phone]      Follow up with Primary Care Doctor. (Please follow up with your primary doctor to continue to follow your other medical problems including if you have any further GI bleeding (very important).)       Follow up with Primary Care Doctor. (Your Prozac was changed to Celexa this admission due to  interaction with Plavix - please call your primary doctor to set up an appointment to continue to follow you while on this new medicine)            Duration of Discharge Encounter: 1 hour including physician and PA time.  Signed, Beverley Allender PA-C 05/14/2011, 8:16 AM

## 2011-05-28 ENCOUNTER — Ambulatory Visit (INDEPENDENT_AMBULATORY_CARE_PROVIDER_SITE_OTHER): Payer: Self-pay | Admitting: Cardiovascular Disease

## 2011-05-28 ENCOUNTER — Encounter: Payer: Self-pay | Admitting: Cardiovascular Disease

## 2011-05-28 VITALS — BP 125/75 | HR 100 | Ht 63.5 in | Wt 145.0 lb

## 2011-05-28 DIAGNOSIS — Z79899 Other long term (current) drug therapy: Secondary | ICD-10-CM

## 2011-05-28 DIAGNOSIS — I2510 Atherosclerotic heart disease of native coronary artery without angina pectoris: Secondary | ICD-10-CM

## 2011-05-28 DIAGNOSIS — E785 Hyperlipidemia, unspecified: Secondary | ICD-10-CM

## 2011-05-28 DIAGNOSIS — I509 Heart failure, unspecified: Secondary | ICD-10-CM

## 2011-05-28 MED ORDER — METOPROLOL TARTRATE 25 MG PO TABS: 25.0000 mg | ORAL_TABLET | Freq: Two times a day (BID) | ORAL | Status: DC

## 2011-05-28 NOTE — Patient Instructions (Signed)
Your physician wants you to follow-up in: 4 months. You will receive a reminder letter in the mail one-two months in advance. If you don't receive a letter, please call our office to schedule the follow-up appointment. Stop Coreg (carvedilol)  Start metoprolol tartrate (Lopressor) 25 mg two times a day. Continue Plavix for a total of 3 months and then stop. Your physician recommends that you go to the [facility] for a FASTING lipid profile and liver function labs. Do not eat or drink after midnight. DO IN 3 WEEKS.

## 2011-05-28 NOTE — Assessment & Plan Note (Signed)
Continue treatment with pravastatin. Check lipid and liver profile in 3 weeks from now.

## 2011-05-28 NOTE — Progress Notes (Signed)
HPI  This is a 61 year old female who is here today for a followup visit. She has a prolonged history of tobacco use. She presented recently to [facility] with weakness, blood in the stool and hypokalemia. She was found to have mild anemia. She was given IV fluids but developed congestive heart failure significant hypoxia. She had an echocardiogram done which showed an ejection fraction of 40-45%. She started having chest discomfort with T wave changes in the anterior leads. She was subsequently transferred to [facility] where she underwent cardiac catheterization which showed an occluded mid left circumflex seem to be chronic with collaterals. The LAD had a 70% mid stenosis. Pressure wire interrogation revealed an FFR ratio of 0.71. She underwent an angioplasty and 2 overlap bare-metal stent placement to the LAD without complications. She has been doing reasonably well. She quit smoking. She had only a few episodes of chest tightness since her hospital discharge. Most of these episodes are in the setting of palpitations. She feels that her heart sometimes is going fast. She denies any syncope or presyncope.  Allergies  Allergen Reactions  . Penicillins Swelling    Per patient report, she has facial and tongue swellling  . Percocet (Oxycodone-Acetaminophen) Other (See Comments)    Hallucinations  . Percodan (Oxycodone-Aspirin) Other (See Comments)    Hallucinations     Current Outpatient Prescriptions on File Prior to Visit  Medication Sig Dispense Refill  . aspirin 81 MG EC tablet Take 1 tablet (81 mg total) by mouth daily.      . clopidogrel (PLAVIX) 75 MG tablet Take 1 tablet (75 mg total) by mouth daily with breakfast.  30 tablet  2  . furosemide (LASIX) 20 MG tablet Take 1 tablet (20 mg total) by mouth daily.  30 tablet  2  . LORazepam (ATIVAN) 1 MG tablet Take 1 tablet (1 mg total) by mouth every 6 (six) hours as needed for anxiety.  30 tablet  0  . nicotine (NICODERM  CQ - DOSED IN MG/24 HOURS) 21 mg/24hr patch Place 1 patch onto the skin daily. Remove old patch before applying new patch      . nitroGLYCERIN (NITROSTAT) 0.4 MG SL tablet Place 1 tablet (0.4 mg total) under the tongue every 5 (five) minutes as needed for chest pain (up to 3 doses).  25 tablet  4  . pravastatin (PRAVACHOL) 40 MG tablet Take 1 tablet (40 mg total) by mouth daily.  30 tablet  6     Past Medical History  Diagnosis Date  . Hypertension   . Anxiety   . Depression   . Arthritis   . Irritable bowel syndrome (IBS)   . Diverticulitis   . Insomnia   . Eczema   . Gout   . GI bleed     Possible mild GI bleed 04/2011, hemoccult negative at [facility] and no colonoscopy/EGD felt necessary by GI at that time  . CHF (congestive heart failure)     EF 40-45% by echo at [facility] 04/2011  . CAD (coronary artery disease)      s/p two overlapping BMS to LAD 05/12/11, Occluded mid LCX with collaterals (small caliber)  . Hyperlipidemia      Past Surgical History  Procedure Date  . Abdominal hysterectomy   . Cardiac catheterization   . Coronary angioplasty 05/2011    2 BMS to mid LAD     No family history on file.   History   Social History  .  Marital Status: Divorced    Spouse Name: N/A    Number of Children: N/A  . Years of Education: N/A   Occupational History  . Not on file.   Social History Main Topics  . Smoking status: Former Smoker -- 1.0 packs/day for 50 years    Types: Cigarettes    Quit date: 05/04/2011  . Smokeless tobacco: Never Used  . Alcohol Use: Not on file  . Drug Use: Not on file  . Sexually Active: Not on file   Other Topics Concern  . Not on file   Social History Narrative  . No narrative on file     PHYSICAL EXAM   BP 125/75  Pulse 100  Ht 5' 3.5" (1.613 m)  Wt 145 lb (65.772 kg)  BMI 25.28 kg/m2  Constitutional: She is oriented to person, place, and time. She appears well-developed and well-nourished. No distress.  HENT:  No nasal discharge.  Head: Normocephalic and atraumatic.  Eyes: Pupils are equal and round. Right eye exhibits no discharge. Left eye exhibits no discharge.  Neck: Normal range of motion. Neck supple. No JVD present. No thyromegaly present.  Cardiovascular: Normal rate, regular rhythm, normal heart sounds. Exam reveals no gallop and no friction rub. No murmur heard.  Pulmonary/Chest: Effort normal and breath sounds normal. No stridor. No respiratory distress. She has no wheezes. She has no rales. She exhibits no tenderness.  Abdominal: Soft. Bowel sounds are normal. She exhibits no distension. There is no tenderness. There is no rebound and no guarding.  Musculoskeletal: Normal range of motion. She exhibits no edema and no tenderness.  Neurological: She is alert and oriented to person, place, and time. Coordination normal.  Skin: Skin is warm and dry. No rash noted. She is not diaphoretic. No erythema. No pallor.  Psychiatric: She has a normal mood and affect. Her behavior is normal. Judgment and thought content normal.  Right radial pulse is normal with no hematoma.    ASSESSMENT AND PLAN

## 2011-05-28 NOTE — Assessment & Plan Note (Signed)
She seems to be euvolemic. Continue small dose Lasix 20 mg once daily. She is not on an ACE inhibitor due to hypotension.

## 2011-05-28 NOTE — Assessment & Plan Note (Signed)
The patient seems to be doing reasonably well. She only had few episodes of brief chest pain since her hospital discharge. I recommend continuing aspirin 81 mg daily indefinitely and Plavix 75 mg once daily for a total of 3 months. She seems to be tolerating this fine. Plavix will be stopped after that especially due to her recent history of mild lower GI bleed. Given her slightly high resting heart rate and complained of limitations, I will switch carvedilol to metoprolol 25 mg twice daily. I encouraged her to attend cardiac rehabilitation she doesn't think she can do that. She seems to be significantly deconditioned and I suspect that she has a component of COPD as well.

## 2011-06-24 ENCOUNTER — Telehealth: Payer: Self-pay | Admitting: *Deleted

## 2011-06-24 NOTE — Telephone Encounter (Signed)
Message copied by Arlyss Gandy on Thu Jun 24, 2011  4:37 PM ------      Message from: Lorine Bears A      Created: Mon Jun 21, 2011  5:21 PM       Inform patient that her labs were ok. Her cholesterol is good but triglyceride is high. Increase Pravastatin to 80 mg qhs. # 30 with 6 refills.

## 2011-06-24 NOTE — Telephone Encounter (Signed)
Left message to call back on voice mail

## 2011-06-25 MED ORDER — PRAVASTATIN SODIUM 80 MG PO TABS: 80.0000 mg | ORAL_TABLET | Freq: Every day | ORAL | Status: DC

## 2011-06-25 NOTE — Telephone Encounter (Signed)
Patient informed. New prescription sent to [facility].

## 2011-09-27 ENCOUNTER — Ambulatory Visit (INDEPENDENT_AMBULATORY_CARE_PROVIDER_SITE_OTHER): Payer: Medicaid Other | Admitting: Cardiology

## 2011-09-27 ENCOUNTER — Encounter: Payer: Self-pay | Admitting: Cardiology

## 2011-09-27 VITALS — BP 154/81 | HR 68 | Ht 64.0 in | Wt 150.0 lb

## 2011-09-27 DIAGNOSIS — I2510 Atherosclerotic heart disease of native coronary artery without angina pectoris: Secondary | ICD-10-CM

## 2011-09-27 DIAGNOSIS — E785 Hyperlipidemia, unspecified: Secondary | ICD-10-CM

## 2011-09-27 DIAGNOSIS — I509 Heart failure, unspecified: Secondary | ICD-10-CM

## 2011-09-27 MED ORDER — ISOSORBIDE MONONITRATE ER 60 MG PO TB24: 60.0000 mg | ORAL_TABLET | Freq: Every day | ORAL | Status: DC

## 2011-09-27 NOTE — Progress Notes (Signed)
HPI The patient has a known ischemic cardiomyopathy with an ejection fraction of 40-45%. Cardiac catheterization iin February of this year showed an occluded mid left circumflex seem to be chronic with collaterals. The LAD had a 70% mid stenosis. Pressure wire interrogation revealed an FFR ratio of 0.71. She underwent an angioplasty and 2 overlap bare-metal stent placement to the LAD without complications.  She now returns for followup.   She says that for the past month she has been getting some chest discomfort. It has been sporadic. She describes a 5/10 pressure that can last for up to an hour. She has taken nitroglycerin with some improvement. She also has some fleeting shooting pains. She does not describe neck or jaw discomfort. She does not describe associated nausea vomiting or diaphoresis. She's not had palpitations, presyncope or syncope. She never had in chest pain so this is new for her. She is able to exercise which she has been doing relatively routinely without bringing this on.   Allergies  Allergen Reactions  . Penicillins Swelling    Per patient report, she has facial and tongue swellling  . Percocet (Oxycodone-Acetaminophen) Other (See Comments)    Hallucinations  . Percodan (Oxycodone-Aspirin) Other (See Comments)    Hallucinations    Current Outpatient Prescriptions  Medication Sig Dispense Refill  . aspirin 81 MG EC tablet Take 1 tablet (81 mg total) by mouth daily.      Marland Kitchen FLUoxetine (PROZAC) 20 MG capsule Take 20 mg by mouth daily.      . Fluticasone-Salmeterol (ADVAIR DISKUS) 100-50 MCG/DOSE AEPB Inhale 2 puffs into the lungs every 12 (twelve) hours.      . furosemide (LASIX) 20 MG tablet Take 1 tablet (20 mg total) by mouth daily.  30 tablet  2  . LORazepam (ATIVAN) 1 MG tablet Take 1 tablet (1 mg total) by mouth every 6 (six) hours as needed for anxiety.  30 tablet  0  . metoprolol tartrate (LOPRESSOR) 25 MG tablet Take 1 tablet (25 mg total) by mouth 2 (two)  times daily.  60 tablet  6  . nicotine (NICODERM CQ - DOSED IN MG/24 HOURS) 21 mg/24hr patch Place 1 patch onto the skin daily. Remove old patch before applying new patch      . nitroGLYCERIN (NITROSTAT) 0.4 MG SL tablet Place 1 tablet (0.4 mg total) under the tongue every 5 (five) minutes as needed for chest pain (up to 3 doses).  25 tablet  4  . pravastatin (PRAVACHOL) 80 MG tablet Take 1 tablet (80 mg total) by mouth daily.  30 tablet  6  . DISCONTD: carvedilol (COREG) 3.125 MG tablet Take 1 tablet (3.125 mg total) by mouth 2 (two) times daily with a meal.  60 tablet  6  . DISCONTD: citalopram (CELEXA) 20 MG tablet Take 1 tablet (20 mg total) by mouth daily.  30 tablet  1    Past Medical History  Diagnosis Date  . Hypertension   . Anxiety   . Depression   . Arthritis   . Irritable bowel syndrome (IBS)   . Diverticulitis   . Insomnia   . Eczema   . Gout   . GI bleed     Possible mild GI bleed 04/2011, hemoccult negative at [facility] and no colonoscopy/EGD felt necessary by GI at that time  . CHF (congestive heart failure)     EF 40-45% by echo at [facility] 04/2011  . CAD (coronary artery disease)  s/p two overlapping BMS to LAD 05/12/11, Occluded mid LCX with collaterals (small caliber)  . Hyperlipidemia     Past Surgical History  Procedure Date  . Abdominal hysterectomy   . Cardiac catheterization   . Coronary angioplasty 05/2011    2 BMS to mid LAD    ROS:  As stated in the HPI and negative for all other systems.  PHYSICAL EXAM BP 154/81  Pulse 68  Ht 5\' 4"  (1.626 m)  Wt 150 lb (68.04 kg)  BMI 25.75 kg/m2  SpO2 99% GENERAL:  Well appearing HEENT:  Pupils equal round and reactive, fundi not visualized, oral mucosa unremarkable, dentures NECK:  No jugular venous distention, waveform within normal limits, carotid upstroke brisk and symmetric, no bruits, no thyromegaly LYMPHATICS:  No cervical, inguinal adenopathy LUNGS:  Clear to auscultation  bilaterally BACK:  No CVA tenderness CHEST:  Unremarkable HEART:  PMI not displaced or sustained,S1 and S2 within normal limits, no S3, no S4, no clicks, no rubs, no murmurs ABD:  Flat, positive bowel sounds normal in frequency in pitch, no bruits, no rebound, no guarding, no midline pulsatile mass, no hepatomegaly, no splenomegaly EXT:  2 plus pulses throughout, no edema, no cyanosis no clubbing, right ganglionic cyst SKIN:  No rashes no nodules NEURO:  Cranial nerves II through XII grossly intact, motor grossly intact throughout PSYCH:  Cognitively intact, oriented to person place and time   ASSESSMENT AND PLAN   CAD (coronary artery disease) -  She is describing some chest discomfort that could be angina. This could be related to the chronic total occlusion. I'm going to add Imdur 60 mg. Further evaluation will be based on her response to this.   CHF (congestive heart failure) - Lorine Bears, MD 05/28/2011 3:41 PM Signed  She seems to be euvolemic. Continue small dose Lasix 20 mg once daily. She was not put on an ACE inhibitor because of hypotension. However, her blood pressure is up slightly. I will consider titrating this in the future based on blood pressure readings and her response to the Imdur   Hyperlipidemia - Lorine Bears, MD 05/28/2011 3:41 PM Signed  Continue treatment with pravastatin.  Her last LDL was 70 and HDL 56 in April.

## 2011-09-27 NOTE — Patient Instructions (Addendum)
Your physician recommends that you schedule a follow-up appointment in: 3 months with Dr. Antoine Poche.  Your physician has recommended you make the following change in your medication: Start isosorbide mononitrate (imdur) 60 mg daily. All other medications are still the same.

## 2011-12-28 ENCOUNTER — Ambulatory Visit: Payer: Medicaid Other | Admitting: Cardiology

## 2012-01-24 ENCOUNTER — Ambulatory Visit: Payer: Medicaid Other | Admitting: Cardiology

## 2012-03-13 ENCOUNTER — Encounter: Payer: Self-pay | Admitting: Cardiology

## 2012-03-13 ENCOUNTER — Ambulatory Visit (INDEPENDENT_AMBULATORY_CARE_PROVIDER_SITE_OTHER): Payer: Medicaid Other | Admitting: Cardiology

## 2012-03-13 VITALS — BP 134/83 | HR 87 | Ht 64.0 in | Wt 157.0 lb

## 2012-03-13 DIAGNOSIS — R52 Pain, unspecified: Secondary | ICD-10-CM

## 2012-03-13 DIAGNOSIS — E785 Hyperlipidemia, unspecified: Secondary | ICD-10-CM

## 2012-03-13 DIAGNOSIS — I509 Heart failure, unspecified: Secondary | ICD-10-CM

## 2012-03-13 DIAGNOSIS — I2510 Atherosclerotic heart disease of native coronary artery without angina pectoris: Secondary | ICD-10-CM

## 2012-03-13 NOTE — Patient Instructions (Signed)
Continue all current medications. Right elbow x-ray Office will call with results Your physician wants you to follow up in: 6 months.  You will receive a reminder letter in the mail one-two months in advance.  If you don't receive a letter, please call our office to schedule the follow up appointment

## 2012-03-13 NOTE — Progress Notes (Signed)
HPI The patient has a known ischemic cardiomyopathy with an ejection fraction of 40-45%. Cardiac catheterization iin February of this year showed an occluded mid left circumflex seem to be chronic with collaterals. The LAD had a 70% mid stenosis. Pressure wire interrogation revealed an FFR ratio of 0.71. She underwent an angioplasty and 2 overlap bare-metal stent placement to the LAD without complications.  She returns for follow up.  At the last visit she was having some chest discomfort and I added Imdur.  Since that time she's taken a few nitroglycerin but thinks her pattern of chest discomfort is stable. She was hospitalized on 12/22 with shortness of breath. I reviewed the hospital records. She was treated for pneumonia. Her BNP was very slightly elevated. Her CT did demonstrate patchy infiltrates right greater than left felt to be multifocal pneumonia versus asymmetric edema. There was also mention of aortic atherosclerosis with ulcerated plaque in the thoracic aorta. The patient reports that her breathing is better since she has been treated with antibiotics and is now on steroids. Unfortunately she also fell hitting her right elbow. He didn't start hurting until today really and she has noticeable swelling.   Allergies  Allergen Reactions  . Penicillins Swelling    Per patient report, she has facial and tongue swellling  . Percocet (Oxycodone-Acetaminophen) Other (See Comments)    Hallucinations  . Percodan (Oxycodone-Aspirin) Other (See Comments)    Hallucinations    Current Outpatient Prescriptions  Medication Sig Dispense Refill  . aspirin 81 MG EC tablet Take 1 tablet (81 mg total) by mouth daily.      Marland Kitchen FLUoxetine (PROZAC) 20 MG capsule Take 20 mg by mouth daily.      . Fluticasone-Salmeterol (ADVAIR DISKUS) 100-50 MCG/DOSE AEPB Inhale 2 puffs into the lungs every 12 (twelve) hours.      . furosemide (LASIX) 20 MG tablet Take 1 tablet (20 mg total) by mouth daily.  30 tablet  2    . imipramine (TOFRANIL) 10 MG tablet Take 10 mg by mouth 3 (three) times daily.      . isosorbide mononitrate (IMDUR) 60 MG 24 hr tablet Take 1 tablet (60 mg total) by mouth daily.  30 tablet  3  . LORazepam (ATIVAN) 1 MG tablet Take 1 tablet (1 mg total) by mouth every 6 (six) hours as needed for anxiety.  30 tablet  0  . metoprolol tartrate (LOPRESSOR) 25 MG tablet Take 1 tablet (25 mg total) by mouth 2 (two) times daily.  60 tablet  6  . nitroGLYCERIN (NITROSTAT) 0.4 MG SL tablet Place 1 tablet (0.4 mg total) under the tongue every 5 (five) minutes as needed for chest pain (up to 3 doses).  25 tablet  4  . pravastatin (PRAVACHOL) 80 MG tablet Take 1 tablet (80 mg total) by mouth daily.  30 tablet  6  . predniSONE (DELTASONE) 10 MG tablet Take 10 mg by mouth 2 (two) times daily.      . solifenacin (VESICARE) 10 MG tablet Take 5 mg by mouth daily.      . [DISCONTINUED] carvedilol (COREG) 3.125 MG tablet Take 1 tablet (3.125 mg total) by mouth 2 (two) times daily with a meal.  60 tablet  6  . [DISCONTINUED] citalopram (CELEXA) 20 MG tablet Take 1 tablet (20 mg total) by mouth daily.  30 tablet  1    Past Medical History  Diagnosis Date  . Hypertension   . Anxiety   . Depression   .  Arthritis   . Irritable bowel syndrome (IBS)   . Diverticulitis   . Insomnia   . Eczema   . Gout   . GI bleed     Possible mild GI bleed 04/2011, hemoccult negative at [facility] and no colonoscopy/EGD felt necessary by GI at that time  . CHF (congestive heart failure)     EF 40-45% by echo at [facility] 04/2011  . CAD (coronary artery disease)      s/p two overlapping BMS to LAD 05/12/11, Occluded mid LCX with collaterals (small caliber)  . Hyperlipidemia     Past Surgical History  Procedure Date  . Abdominal hysterectomy   . Cardiac catheterization   . Coronary angioplasty 05/2011    2 BMS to mid LAD    ROS:  As stated in the HPI and negative for all other systems.  PHYSICAL EXAM BP 134/83   Pulse 87  Ht 5\' 4"  (1.626 m)  Wt 157 lb (71.215 kg)  BMI 26.95 kg/m2  SpO2 93% GENERAL:  Well appearing HEENT:  Pupils equal round and reactive, fundi not visualized, oral mucosa unremarkable, dentures NECK:  No jugular venous distention, waveform within normal limits, carotid upstroke brisk and symmetric, no bruits, no thyromegaly LYMPHATICS:  No cervical, inguinal adenopathy LUNGS:  Clear to auscultation bilaterally BACK:  No CVA tenderness CHEST:  Unremarkable HEART:  PMI not displaced or sustained,S1 and S2 within normal limits, no S3, no S4, no clicks, no rubs, no murmurs ABD:  Flat, positive bowel sounds normal in frequency in pitch, no bruits, no rebound, no guarding, no midline pulsatile mass, no hepatomegaly, no splenomegaly EXT:  2 plus pulses throughout, no edema, no cyanosis no clubbing, right ganglionic cyst, right elbow swelling and slight erythema SKIN:  No rashes no nodules NEURO:  Cranial nerves II through XII grossly intact, motor grossly intact throughout PSYCH:  Cognitively intact, oriented to person place and time   ASSESSMENT AND PLAN   CAD (coronary artery disease) -   She is currently not having any unstable symptoms. No change in therapy is indicated. She will continue with attempted aggressive risk reduction.   CHF (congestive heart failure) - She seems to be euvolemic. Continue small dose Lasix 20 mg once daily. She was not put on an ACE inhibitor in the past because of hypotension. I will consider titrating this in the future based on blood pressure readings.   Hyperlipidemia -  Continue treatment with pravastatin.  Her last LDL was 70 and HDL 56 in April.  Elbow pain - I will take the liberty of ordering some elbow films to make sure there is no fracture. I will refer these results back to Dr. Sherril Croon.

## 2012-03-16 ENCOUNTER — Telehealth: Payer: Self-pay | Admitting: *Deleted

## 2012-03-16 NOTE — Telephone Encounter (Signed)
Patient informed and copy sent to PCP office.

## 2012-03-16 NOTE — Telephone Encounter (Signed)
Message copied by Eustace Moore on Thu Mar 16, 2012  1:46 PM ------      Message from: Rollene Rotunda      Created: Wed Mar 15, 2012 12:25 PM       No evidence of fracture.  There is evidence of effusion.  Please send this note to VYAS,DHRUV B., MD and ask the patient to follow up in that office for evaluation of elbow pain.

## 2012-05-03 ENCOUNTER — Other Ambulatory Visit: Payer: Self-pay | Admitting: Cardiology

## 2012-05-03 NOTE — Telephone Encounter (Signed)
..   Requested Prescriptions   Pending Prescriptions Disp Refills  . isosorbide mononitrate (IMDUR) 60 MG 24 hr tablet [Pharmacy Med Name: ISOSORB MONO 60MG  ER] 30 tablet 3    Sig: TAKE 1 TABLET BY MOUTH EVERY DAY

## 2012-05-17 ENCOUNTER — Inpatient Hospital Stay (HOSPITAL_COMMUNITY)
Admission: AD | Admit: 2012-05-17 | Discharge: 2012-05-20 | DRG: 247 | Disposition: A | Discharge status: Home / Self Care | Payer: Medicaid Other | Source: Other Acute Inpatient Hospital | Attending: Cardiology | Admitting: Cardiology

## 2012-05-17 ENCOUNTER — Encounter (HOSPITAL_COMMUNITY): Payer: Self-pay | Admitting: *Deleted

## 2012-05-17 DIAGNOSIS — F3289 Other specified depressive episodes: Secondary | ICD-10-CM | POA: Diagnosis present

## 2012-05-17 DIAGNOSIS — Z79899 Other long term (current) drug therapy: Secondary | ICD-10-CM

## 2012-05-17 DIAGNOSIS — Z880 Allergy status to penicillin: Secondary | ICD-10-CM

## 2012-05-17 DIAGNOSIS — Z90710 Acquired absence of both cervix and uterus: Secondary | ICD-10-CM

## 2012-05-17 DIAGNOSIS — K7689 Other specified diseases of liver: Secondary | ICD-10-CM | POA: Diagnosis present

## 2012-05-17 DIAGNOSIS — R079 Chest pain, unspecified: Secondary | ICD-10-CM

## 2012-05-17 DIAGNOSIS — G4700 Insomnia, unspecified: Secondary | ICD-10-CM | POA: Diagnosis present

## 2012-05-17 DIAGNOSIS — F411 Generalized anxiety disorder: Secondary | ICD-10-CM | POA: Diagnosis present

## 2012-05-17 DIAGNOSIS — E785 Hyperlipidemia, unspecified: Secondary | ICD-10-CM | POA: Diagnosis present

## 2012-05-17 DIAGNOSIS — I714 Abdominal aortic aneurysm, without rupture, unspecified: Secondary | ICD-10-CM | POA: Diagnosis present

## 2012-05-17 DIAGNOSIS — I959 Hypotension, unspecified: Secondary | ICD-10-CM | POA: Diagnosis present

## 2012-05-17 DIAGNOSIS — I2589 Other forms of chronic ischemic heart disease: Secondary | ICD-10-CM | POA: Diagnosis present

## 2012-05-17 DIAGNOSIS — R32 Unspecified urinary incontinence: Secondary | ICD-10-CM | POA: Diagnosis present

## 2012-05-17 DIAGNOSIS — Z9861 Coronary angioplasty status: Secondary | ICD-10-CM

## 2012-05-17 DIAGNOSIS — F329 Major depressive disorder, single episode, unspecified: Secondary | ICD-10-CM | POA: Diagnosis present

## 2012-05-17 DIAGNOSIS — I214 Non-ST elevation (NSTEMI) myocardial infarction: Secondary | ICD-10-CM | POA: Diagnosis present

## 2012-05-17 DIAGNOSIS — N289 Disorder of kidney and ureter, unspecified: Secondary | ICD-10-CM | POA: Diagnosis present

## 2012-05-17 DIAGNOSIS — IMO0002 Reserved for concepts with insufficient information to code with codable children: Secondary | ICD-10-CM

## 2012-05-17 DIAGNOSIS — R160 Hepatomegaly, not elsewhere classified: Secondary | ICD-10-CM | POA: Diagnosis present

## 2012-05-17 DIAGNOSIS — I509 Heart failure, unspecified: Secondary | ICD-10-CM | POA: Diagnosis present

## 2012-05-17 DIAGNOSIS — F17200 Nicotine dependence, unspecified, uncomplicated: Secondary | ICD-10-CM | POA: Diagnosis present

## 2012-05-17 DIAGNOSIS — L259 Unspecified contact dermatitis, unspecified cause: Secondary | ICD-10-CM | POA: Diagnosis present

## 2012-05-17 DIAGNOSIS — M109 Gout, unspecified: Secondary | ICD-10-CM | POA: Diagnosis present

## 2012-05-17 DIAGNOSIS — I10 Essential (primary) hypertension: Secondary | ICD-10-CM | POA: Diagnosis present

## 2012-05-17 DIAGNOSIS — I2582 Chronic total occlusion of coronary artery: Secondary | ICD-10-CM | POA: Diagnosis present

## 2012-05-17 DIAGNOSIS — K589 Irritable bowel syndrome without diarrhea: Secondary | ICD-10-CM | POA: Diagnosis present

## 2012-05-17 DIAGNOSIS — Z885 Allergy status to narcotic agent status: Secondary | ICD-10-CM

## 2012-05-17 DIAGNOSIS — K5732 Diverticulitis of large intestine without perforation or abscess without bleeding: Secondary | ICD-10-CM | POA: Diagnosis present

## 2012-05-17 DIAGNOSIS — I2510 Atherosclerotic heart disease of native coronary artery without angina pectoris: Secondary | ICD-10-CM

## 2012-05-17 HISTORY — DX: Abdominal aortic aneurysm, without rupture: I71.4

## 2012-05-17 HISTORY — DX: Tobacco use: Z72.0

## 2012-05-17 HISTORY — DX: Ischemic cardiomyopathy: I25.5

## 2012-05-17 HISTORY — DX: Disorder of kidney and ureter, unspecified: N28.9

## 2012-05-17 HISTORY — DX: Liver disease, unspecified: K76.9

## 2012-05-17 LAB — CK TOTAL AND CKMB (NOT AT ARMC): Total CK: 135 U/L (ref 7–177)

## 2012-05-17 LAB — TROPONIN I: Troponin I: 0.93 ng/mL (ref <0.30)

## 2012-05-17 MED ORDER — CLOPIDOGREL BISULFATE 75 MG PO TABS
75.0000 mg | ORAL_TABLET | Freq: Every day | ORAL | Status: DC
  Administered 2012-05-18 – 2012-05-20 (×2): 75 mg via ORAL
  Filled 2012-05-17 (×4): qty 1

## 2012-05-17 MED ORDER — LORAZEPAM 0.5 MG PO TABS
1.0000 mg | ORAL_TABLET | Freq: Four times a day (QID) | ORAL | Status: DC | PRN
  Administered 2012-05-19 – 2012-05-20 (×2): 1 mg via ORAL
  Filled 2012-05-17 (×2): qty 2

## 2012-05-17 MED ORDER — METOPROLOL TARTRATE 25 MG PO TABS
25.0000 mg | ORAL_TABLET | Freq: Two times a day (BID) | ORAL | Status: DC
Start: 2012-05-18 — End: 2012-05-20
  Administered 2012-05-18 – 2012-05-20 (×5): 25 mg via ORAL
  Filled 2012-05-17 (×8): qty 1

## 2012-05-17 MED ORDER — ACETAMINOPHEN 325 MG PO TABS
650.0000 mg | ORAL_TABLET | ORAL | Status: DC | PRN
  Administered 2012-05-19: 650 mg via ORAL
  Filled 2012-05-17: qty 2

## 2012-05-17 MED ORDER — SIMVASTATIN 40 MG PO TABS
40.0000 mg | ORAL_TABLET | Freq: Every day | ORAL | Status: DC
  Administered 2012-05-19: 18:00:00 40 mg via ORAL
  Filled 2012-05-17 (×3): qty 1

## 2012-05-17 MED ORDER — ONDANSETRON HCL 4 MG/2ML IJ SOLN: 4.0000 mg | Freq: Four times a day (QID) | INTRAMUSCULAR | Status: DC | PRN

## 2012-05-17 MED ORDER — PREDNISONE 10 MG PO TABS
10.0000 mg | ORAL_TABLET | Freq: Two times a day (BID) | ORAL | Status: DC
  Administered 2012-05-17: 10 mg via ORAL
  Filled 2012-05-17 (×3): qty 1

## 2012-05-17 MED ORDER — IMIPRAMINE HCL 10 MG PO TABS
10.0000 mg | ORAL_TABLET | Freq: Three times a day (TID) | ORAL | Status: DC
  Administered 2012-05-18 – 2012-05-20 (×6): 10 mg via ORAL
  Filled 2012-05-17 (×12): qty 1

## 2012-05-17 MED ORDER — SODIUM CHLORIDE 0.9 % IJ SOLN
3.0000 mL | Freq: Two times a day (BID) | INTRAMUSCULAR | Status: DC
  Administered 2012-05-17 – 2012-05-19 (×4): 3 mL via INTRAVENOUS

## 2012-05-17 MED ORDER — SODIUM CHLORIDE 0.9 % IJ SOLN: 3.0000 mL | INTRAMUSCULAR | Status: DC | PRN

## 2012-05-17 MED ORDER — DARIFENACIN HYDROBROMIDE ER 15 MG PO TB24
15.0000 mg | ORAL_TABLET | Freq: Every day | ORAL | Status: DC
  Administered 2012-05-17 – 2012-05-20 (×4): 15 mg via ORAL
  Filled 2012-05-17 (×4): qty 1

## 2012-05-17 MED ORDER — ZOLPIDEM TARTRATE 5 MG PO TABS: 5.0000 mg | ORAL_TABLET | Freq: Every evening | ORAL | Status: DC | PRN

## 2012-05-17 MED ORDER — FLUOXETINE HCL 20 MG PO CAPS
20.0000 mg | ORAL_CAPSULE | Freq: Every day | ORAL | Status: DC
  Administered 2012-05-18 – 2012-05-20 (×3): 20 mg via ORAL
  Filled 2012-05-17 (×3): qty 1

## 2012-05-17 MED ORDER — ASPIRIN EC 81 MG PO TBEC
81.0000 mg | DELAYED_RELEASE_TABLET | Freq: Every day | ORAL | Status: DC
  Administered 2012-05-18 – 2012-05-20 (×3): 81 mg via ORAL
  Filled 2012-05-17 (×3): qty 1

## 2012-05-17 MED ORDER — NITROGLYCERIN 0.4 MG SL SUBL
0.4000 mg | SUBLINGUAL_TABLET | SUBLINGUAL | Status: DC | PRN
  Administered 2012-05-18 – 2012-05-19 (×3): 0.4 mg via SUBLINGUAL
  Filled 2012-05-17: qty 25

## 2012-05-17 MED ORDER — SODIUM CHLORIDE 0.9 % IV SOLN: INTRAVENOUS | Status: DC

## 2012-05-17 MED ORDER — SODIUM CHLORIDE 0.9 % IV SOLN: 250.0000 mL | INTRAVENOUS | Status: DC | PRN

## 2012-05-17 MED ORDER — HEPARIN (PORCINE) IN NACL 100-0.45 UNIT/ML-% IJ SOLN
1000.0000 [IU]/h | INTRAMUSCULAR | Status: DC
  Filled 2012-05-17 (×2): qty 250

## 2012-05-17 MED ORDER — MOMETASONE FURO-FORMOTEROL FUM 100-5 MCG/ACT IN AERO
2.0000 | INHALATION_SPRAY | Freq: Two times a day (BID) | RESPIRATORY_TRACT | Status: DC
  Administered 2012-05-17 – 2012-05-20 (×4): 2 via RESPIRATORY_TRACT
  Filled 2012-05-17 (×2): qty 8.8

## 2012-05-17 NOTE — Progress Notes (Signed)
ccu fellow made aware of positve troponin results(.93)the patient denied any chest pain at this time w/ bp staying in the 90s.

## 2012-05-17 NOTE — Consult Note (Signed)
ANTICOAGULATION CONSULT NOTE - Initial Consult  Pharmacy Consult for Heparin Indication: chest pain/ACS  Allergies  Allergen Reactions  . Penicillins Swelling    Per patient report, she has facial and tongue swellling  . Percocet (Oxycodone-Acetaminophen) Other (See Comments)    Hallucinations  . Percodan (Oxycodone-Aspirin) Other (See Comments)    Hallucinations    Patient Measurements: Height: 5' 4.17" (163 cm) Weight: 156 lb 15.5 oz (71.2 kg) IBW/kg (Calculated) : 55.1 Heparin Dosing Weight: 69kg  Vital Signs: Temp: 97.8 F (36.6 C) (03/05 1915) Temp src: Oral (03/05 1915) BP: 94/52 mmHg (03/05 1945) Pulse Rate: 84 (03/05 2000)  Labs: No results found for this basename: HGB, HCT, PLT, APTT, LABPROT, INR, HEPARINUNFRC, CREATININE, CKTOTAL, CKMB, TROPONINI,  in the last 72 hours  Estimated Creatinine Clearance: 44.5 ml/min (by C-G formula based on Cr of 1.24).   Medical History: Past Medical History  Diagnosis Date  . Hypertension   . Anxiety   . Depression   . Arthritis   . Irritable bowel syndrome (IBS)   . Diverticulitis   . Insomnia   . Eczema   . Gout   . GI bleed     Possible mild GI bleed 04/2011, hemoccult negative at [facility] and no colonoscopy/EGD felt necessary by GI at that time  . CHF (congestive heart failure)     EF 40-45% by echo at [facility] 04/2011  . CAD (coronary artery disease)      s/p two overlapping BMS to LAD 05/12/11, Occluded mid LCX with collaterals (small caliber)  . Hyperlipidemia     Medications:  Heparin @ 1000 units/hr  Assessment: 62yof with hx CAD s/p PCI presented to [facility] with CP. Heparin drip was started there around 1830 (4000 unit bolus followed by 1000 units/hr). She was then transferred to [facility] for further management of her NSTEMI.  Goal of Therapy:  Heparin level 0.3-0.7 units/ml Monitor platelets by anticoagulation protocol: Yes   Plan:  1) Continue heparin @ 1000 units/hr (~14units/kg/hr) 2)  Check 6h heparin level 3) Daily heparin level and CBC  Fredrik Rigger 05/17/2012,8:31 PM

## 2012-05-17 NOTE — H&P (Signed)
History and Physical  Patient ID: Carla Hoover MRN: [id number], SOB: May 09, 1950 62 y.o. Date of Encounter: 05/17/2012, 6:50 PM  Primary Physician: Ignatius Specking., MD Primary Cardiologist: Dr. Antoine Poche in [place]  Chief Complaint: Chest pain, palpitations, leg weakness, shortness of breath  HPI: 62 y.o. female w/ PMHx significant for CAD (s/p 2 overlapping BMS to LAD 04/2011, chronically occluded small LCx), ICM (EF 40-45%), HTN, HLD, tobacco abuse, IBS, and Depression/Anxiety who transferred from [facility] to [facility] on 05/17/2012 with complaints of chest pain.  She was hospitalized in 04/2011 with complaints of hematochezia. Her FOBT was negative and there were no findings to suggest a GI bleed. During that hospitalization she developed respiratory distress in the setting of IV hydration and required IV lasix and BiPAP. At some point she complained of chest pain with EKG showing anterior TW changes and was found to have LV dysfunction w/ EF 40-45% by echo and CTA demonstrating CAD. Cardiac cath was performed revealing an occluded LCx with collaterals and 70% mid LAD. She was treated with 2 overlapping BMS to the LAD. She was discharged on ASA, Plavix, Coreg, Lasix and Pravastatin (due to cost). No ACEI was added due to hypotension. In initial hospital follow up with Dr. Kirke Corin, she noted a few brief episodes of chest pain and palpitations. Her coreg was switched to metoprolol and she was continued on Plavix for another 3 months. During follow up in 09/2011 she noted intermittent chest discomfort relieved with NTG. Dr. Antoine Poche noted that it could be angina from her chronically occluded LCx and added Imdur. In 02/2012 she was admitted for PNA at which time her chest CT mentioned aortic atherosclerosis with ulcerated plaque in the thoracic aorta. Her last follow up with Dr. Antoine Poche was in 02/2012 at which time she had no anginal complaints and there were no med changes. Patient states that she has  generally been compliant with her medications, no antihypertensives over the last few days however, had not picked them up yet from the drugstore. She has started smoking again.  She states that she has been experiencing intermittent shortness of breath over the last few weeks, developed chest pressure yesterday, and today while running errands felt weakness in her legs, eventually palpitations, and dizziness. She took SL NTG and was driven to the ER. She was treated with ASA, nitroglycerin and morphine while at [facility], also placed on heparin drip. She developed hypotension with systolics in the 80s, heart rate around 100-110 in sinus rhythm and was given IVF bolus.  Her ECG did not show acute ST segment abnormalities with nonspecific ST-T wave changes in the lateral leads that are old. Cardiac markers were however abnormal with troponin I of 0.18 to 0.45. CK-MB relative index also abnormal up to 13.4%. Chest x-ray showed no acute process. She did have a noncontrasted CT of the chest and abdomen that found an incidentally noted 3.4 cm infrarenal AAA, also an ill-defined low density seen in the right hepatic lobe of uncertain significance. She was not given contrast secondary to renal insufficiency, creatinine 1.7 with GFR 29. Lab work from last year showed creatinine 1.1 1.2. Urinalysis was abnormal, suggests possible UTI, however she states that she has chronic problems with urinary incontinence and sees Dr. Jerre Simon. She denies any dysuria, fevers or chills. She is currently chest pain free with BP 90s/50s.    Past Medical History  Diagnosis Date  . Hypertension   . Anxiety   . Depression   . Arthritis   .  Irritable bowel syndrome (IBS)   . Diverticulitis   . Insomnia   . Eczema   . Gout   . GI bleed     Possible mild GI bleed 04/2011, hemoccult negative at [facility] and no colonoscopy/EGD felt necessary by GI at that time  . CHF (congestive heart failure)     EF 40-45% by echo at [facility] 04/2011  . CAD (coronary artery disease)      s/p two overlapping BMS to LAD 05/12/11, Occluded mid LCX with collaterals (small caliber)  . Hyperlipidemia     04/2011 - Cardiac Cath Hemodynamics:  AO: 139/57 mmHg  LV: 137/2 mmHg  LVEDP: 5 mmHg  Coronary angiography:  Coronary dominance: Right  Left Main: Normal in size and mildly calcified. There is a 20% ostial stenosis.  Left Anterior Descending (LAD): The vessel is normal in size and moderately calcified especially in the proximal and mid segment. There is mild 20% tubular stenosis proximally closer the ostium. That's followed by a 70% calcified eccentric stenosis just before the first septal perforator and at the origin of first diagonal branch. The distal LAD has diffuse nonobstructive atherosclerosis.  1st diagonal (D1): Normal in size with minor irregularities.  2nd diagonal (D2): Normal in size with minor irregularities.  3rd diagonal (D3): Very small in size.  Circumflex (LCx): The vessel is normal in size and nondominant. It is subtotally occluded in the midsegment. After the occlusion it supplies a normal size OM. The vessel gets some antegrade collaterals as well as right to left collaterals.  1st obtuse marginal: Small in size and free of significant disease.  2nd obtuse marginal: Normal in size and fills via collaterals.  3rd obtuse marginal: Very small in size.  Right Coronary Artery: The vessel is normal in size and dominant. There is a diffuse 40% stenosis proximally as well as 30-40% disease in the midsegment. The rest of the vessel has minor irregularities without evidence of obstructive disease.  posterior descending artery: Normal in size with minor irregularities.  posterior lateral branch: 2 medium-sized posterolateral branches which are free of significant disease.  Left ventriculography: Was not performed. EF was 40-45% by recent echocardiogram.  PCI Data:  Vessel - proximal LAD/Segment - 12  Percent Stenosis  (pre) 70%  TIMI-flow 3  Stent 3.0 x 12 mm overlap with a 2.75 x 8 mm integrity bare-metal stent. Post dilated with a 3.25 Gutierrez balloon.  Percent Stenosis (post) 10%  TIMI-flow (post) 3  Final Conclusions:  1. Significant two-vessel coronary artery disease. The mid left circumflex appears to be chronically occluded with collaterals. The vessel diameter appears to be less than 2.5 mm. The LAD stenosis was interrogated with a pressure wire. FFR ratio was 0.71 indicating a flow limiting physiology.  2. Normal LVEDP.  3. Successful angioplasty and 2 overlap bare-metal stent placement to the proximal LAD. A second stent was placed due to the first stent moving backward during deployment.  Recommendations:  The left circumflex occlusion appears to be chronic. Given that the vessel diameter appears to be 2.5 mm or less, I decided not to proceed with PCI of that vessel because of uncertainty in being able to place a drug-eluting stent in the setting of recent mild GI bleed. For the same reason, I chose to put a bare-metal stent in the left anterior descending artery.  I recommend aggressive medical therapy. She will need treatment for heart failure and likely a small dose diuretic before discharge.  Dual antiplatelet therapy is  recommended for a minimal of one month and ideally 3-12 months.   Surgical History:  Past Surgical History  Procedure Laterality Date  . Abdominal hysterectomy    . Cardiac catheterization    . Coronary angioplasty  05/2011    2 BMS to mid LAD     Home Meds: Medication Sig  FLUoxetine (PROZAC) 20 MG capsule Take 20 mg by mouth daily.  Fluticasone-Salmeterol (ADVAIR DISKUS) 100-50 MCG/DOSE AEPB Inhale 2 puffs into the lungs every 12 (twelve) hours.  furosemide (LASIX) 20 MG tablet Take 1 tablet (20 mg total) by mouth daily.  imipramine (TOFRANIL) 10 MG tablet Take 10 mg by mouth 3 (three) times daily.  isosorbide mononitrate (IMDUR) 60 MG 24 hr tablet TAKE 1 TABLET BY MOUTH  EVERY DAY  LORazepam (ATIVAN) 1 MG tablet Take 1 tablet (1 mg total) by mouth every 6 (six) hours as needed for anxiety.  metoprolol tartrate (LOPRESSOR) 25 MG tablet Take 1 tablet (25 mg total) by mouth 2 (two) times daily.  nitroGLYCERIN (NITROSTAT) 0.4 MG SL tablet Place 1 tablet (0.4 mg total) under the tongue every 5 (five) minutes as needed for chest pain (up to 3 doses).  pravastatin (PRAVACHOL) 80 MG tablet Take 1 tablet (80 mg total) by mouth daily.  predniSONE (DELTASONE) 10 MG tablet Take 10 mg by mouth 2 (two) times daily.  solifenacin (VESICARE) 10 MG tablet Take 5 mg by mouth daily.    Allergies:  Allergies  Allergen Reactions  . Penicillins Swelling    Per patient report, she has facial and tongue swellling  . Percocet (Oxycodone-Acetaminophen) Other (See Comments)    Hallucinations  . Percodan (Oxycodone-Aspirin) Other (See Comments)    Hallucinations    History   Social History  . Marital Status: Divorced    Spouse Name: N/A    Number of Children: N/A  . Years of Education: N/A   Occupational History  . Not on file.   Social History Main Topics  . Smoking status: Former Smoker -- 1.00 packs/day for 50 years    Types: Cigarettes    Quit date: 05/04/2011  . Smokeless tobacco: Never Used  . Alcohol Use: Not on file  . Drug Use: Not on file  . Sexually Active: Not on file   Other Topics Concern  . Not on file   Social History Narrative  . No narrative on file     Family history: Noncontributory  Review of Systems: General: negative for chills, fever, night sweats or weight changes.  Cardiovascular: As per HPI Dermatological: negative for rash Respiratory: negative for cough or wheezing Urologic: negative for hematuria Abdominal: negative for nausea, vomiting, diarrhea, bright red blood per rectum, melena, or hematemesis Neurologic: negative for visual changes, syncope All other systems reviewed and are otherwise negative except as noted  above.  Labs: [facility] labs 05/17/12 WBC 8.0, H/H 13.1/38.8, MCV 97.5, Plt 216 Glucose 106, BUN 17, Crt 1.76, Ca 9.1, Na 133, K 3.5, Cl 97, CO2 23, amylase/lipase/LFTs WNL CK 37 -> 49, CKMB 4->6.6, Trop 0.18->0.45 DDimer 1.29 BNP 143  Radiology/Studies:  [facility]  05/17/12 CT chest Impression: 1. Right upper lobe scarring. Coronary artery calcifications suggesting coronary artery disease. No acute abnormality seen in the chest. CT abd/pelvis  Impression: 1. 3.4 cm infrarenal abdominal aortic aneurysm. 2. 21 mm ill-defined low density seen in dominant right hepatic lobe; further evaluation with MRI is recommended to rule out possible neoplasm CXR Impression: No evidence of acute cardiopulmonary disease    EKG:  05/17/12 - sinus tachycardia 109bpm, LVH, no acute ST/T changes compared to old EKG  Physical Exam: There were no vitals taken for this visit. General: Elderly white female, in no acute distress. Head: Normocephalic, atraumatic, sclera non-icteric, nares are without discharge Neck: Supple. Negative for carotid bruits or JVD Lungs: Diminished throughout without rales. Breathing is unlabored. Heart: RRR with S1 S2. 2/6 systolic murmur at LSB. No rubs or gallops appreciated. Abdomen: Soft, non-tender, non-distended with normoactive bowel sounds. No rebound/guarding. No obvious abdominal masses. Msk:  Strength and tone appear normal for age. Extremities: No edema. No clubbing or cyanosis. Distal pedal pulses are intact and equal bilaterally. Neuro: Alert and oriented X 3. Moves all extremities spontaneously. Psych:  Responds to questions appropriately with a normal affect.    ASSESSMENT AND PLAN:  62 y.o. female w/ PMHx significant for CAD (s/p 2 overlapping BMS to LAD 04/2011, chronically occluded small LCx), ICM (EF 40-45%), HTN, HLD, tobacco abuse (quit 04/2011) IBS, and Depression/Anxiety who transferred from [facility] to [facility] on 05/17/2012 with complaints of chest  pain.  Signed, HOPE, JESSICA PA-C 05/17/2012, 6:50 PM   Attending note:  Patient seen and examined. Reviewed records regarding prior cardiac history as well as recent evaluation and [facility]. Patient is followed by Dr. Antoine Poche in [place]. She has a history of overlapping BMS to LAD in February of last year, chronically occluded small left circumflex that was managed medically, ischemic cardiomyopathy with LVEF 40-45%. She states that she has been experiencing intermittent shortness of breath over the last few weeks, developed chest pressure yesterday, and today while running errands felt weakness in her legs, eventually palpitations, and dizziness. She was taken to the ER at [facility] where she was found to be hypotensive with systolics in the 80s, heart rate around 100-110 in sinus rhythm. Her ECG did not show acute ST segment abnormalities with nonspecific ST-T wave changes in the lateral leads that are old. Cardiac markers were however abnormal with troponin I of 0.18 to 0.45. CK-MB relative index also abnormal up to 13.4%.  Chest x-ray showed no acute process. She did have a noncontrasted CT of the chest and abdomen that found an incidentally noted 3.4 cm infrarenal AAA, also an ill-defined low density seen in the right hepatic lobe of uncertain significance. She was not given contrast secondary to renal insufficiency, creatinine 1.7 with GFR 29. Lab work from last year showed creatinine 1.1 1.2. Urinalysis was abnormal, suggests possible UTI, however she states that she has chronic problems with urinary incontinence and sees Dr. Jerre Simon. She denies any dysuria, fevers or chills.  She was treated with ASA, nitroglycerin and morphine while at [facility], also placed on heparin, IV hydration started. She reports no chest pressure now. At present heart rate in sinus rhythm in the 80s, systolic blood pressure 94. Lungs exhibit no crackles with diminished breath sounds throughout. No gallop on  examination.  Patient states that she has generally been compliant with her medications, no antihypertensives over the last few days however, had not picked them up yet from the drugstore. She has started smoking again.  Patient transferred to CCU from [facility] ER with concern for NSTEMI. Plan at this time is to continue aspirin, start Plavix, continue heparin, gently hydrate, give beta blocker with hold parameters, however no other antihypertensives at this time. Followup BMET in the morning as well as ECG. Echocardiogram will be obtained. Also follow up on repeat urinalysis. Expect a diagnostic cardiac catheterization will ultimately need to  be performed to reassess coronary anatomy and assess stent patency, however need to follow up on her  renal function first. Keep n.p.o. after midnight in case procedure can be performed tomorrow. Also need to consider Case Manager consult.  Jonelle Sidle, M.D., F.A.C.C.

## 2012-05-18 ENCOUNTER — Encounter (HOSPITAL_COMMUNITY)
Admission: AD | Disposition: A | Discharge status: Home / Self Care | Payer: Self-pay | Source: Other Acute Inpatient Hospital | Attending: Cardiology

## 2012-05-18 ENCOUNTER — Encounter (HOSPITAL_COMMUNITY): Payer: Self-pay | Admitting: *Deleted

## 2012-05-18 DIAGNOSIS — I214 Non-ST elevation (NSTEMI) myocardial infarction: Secondary | ICD-10-CM | POA: Diagnosis present

## 2012-05-18 DIAGNOSIS — I2510 Atherosclerotic heart disease of native coronary artery without angina pectoris: Secondary | ICD-10-CM

## 2012-05-18 HISTORY — PX: LEFT HEART CATHETERIZATION WITH CORONARY ANGIOGRAM: SHX5451

## 2012-05-18 LAB — TROPONIN I
Troponin I: 1.01 ng/mL (ref <0.30)
Troponin I: 0.93 ng/mL (ref <0.30)

## 2012-05-18 LAB — URINE MICROSCOPIC-ADD ON

## 2012-05-18 LAB — CBC
MCH: 33.4 pg (ref 26.0–34.0)
MCHC: 35.4 g/dL (ref 30.0–36.0)
Platelets: 166 10*3/uL (ref 150–400)
RBC: 3.59 MIL/uL — ABNORMAL LOW (ref 3.87–5.11)

## 2012-05-18 LAB — URINALYSIS, ROUTINE W REFLEX MICROSCOPIC
Ketones, ur: NEGATIVE mg/dL
Nitrite: NEGATIVE
Protein, ur: NEGATIVE mg/dL

## 2012-05-18 LAB — BASIC METABOLIC PANEL
BUN: 15 mg/dL (ref 6–23)
Calcium: 9 mg/dL (ref 8.4–10.5)
GFR calc Af Amer: 47 mL/min — ABNORMAL LOW (ref >90)
GFR calc non Af Amer: 41 mL/min — ABNORMAL LOW (ref >90)
Glucose, Bld: 137 mg/dL — ABNORMAL HIGH (ref 70–99)
Potassium: 3.6 mEq/L (ref 3.5–5.1)
Sodium: 134 mEq/L — ABNORMAL LOW (ref 135–145)

## 2012-05-18 LAB — LIPID PANEL
HDL: 66 mg/dL (ref >39)
Total CHOL/HDL Ratio: 1.8 RATIO
VLDL: 14 mg/dL (ref 0–40)

## 2012-05-18 LAB — CK TOTAL AND CKMB (NOT AT ARMC)
CK, MB: 7 ng/mL (ref 0.3–4.0)
Relative Index: INVALID (ref 0.0–2.5)
Total CK: 85 U/L (ref 7–177)

## 2012-05-18 LAB — HEPARIN LEVEL (UNFRACTIONATED): Heparin Unfractionated: 0.61 IU/mL (ref 0.30–0.70)

## 2012-05-18 SURGERY — LEFT HEART CATHETERIZATION WITH CORONARY ANGIOGRAM
Anesthesia: LOCAL

## 2012-05-18 MED ORDER — MIDAZOLAM HCL 2 MG/2ML IJ SOLN
INTRAMUSCULAR | Status: AC
  Filled 2012-05-18: qty 2

## 2012-05-18 MED ORDER — SODIUM CHLORIDE 0.9 % IV SOLN
INTRAVENOUS | Status: DC
  Administered 2012-05-18: 10:00:00 via INTRAVENOUS

## 2012-05-18 MED ORDER — ASPIRIN 81 MG PO CHEW
324.0000 mg | CHEWABLE_TABLET | ORAL | Status: AC
  Administered 2012-05-18: 243 mg via ORAL

## 2012-05-18 MED ORDER — LIDOCAINE HCL (PF) 1 % IJ SOLN
INTRAMUSCULAR | Status: AC
  Filled 2012-05-18: qty 30

## 2012-05-18 MED ORDER — NITROGLYCERIN 0.4 MG SL SUBL
SUBLINGUAL_TABLET | SUBLINGUAL | Status: AC
  Filled 2012-05-18: qty 25

## 2012-05-18 MED ORDER — FENTANYL CITRATE 0.05 MG/ML IJ SOLN
INTRAMUSCULAR | Status: AC
  Filled 2012-05-18: qty 2

## 2012-05-18 MED ORDER — HEPARIN SODIUM (PORCINE) 1000 UNIT/ML IJ SOLN
INTRAMUSCULAR | Status: AC
  Filled 2012-05-18: qty 1

## 2012-05-18 MED ORDER — BIVALIRUDIN 250 MG IV SOLR
INTRAVENOUS | Status: AC
  Filled 2012-05-18: qty 250

## 2012-05-18 MED ORDER — ASPIRIN 81 MG PO CHEW
CHEWABLE_TABLET | ORAL | Status: AC
  Filled 2012-05-18: qty 3

## 2012-05-18 MED ORDER — ALUM & MAG HYDROXIDE-SIMETH 200-200-20 MG/5ML PO SUSP
30.0000 mL | ORAL | Status: DC | PRN
  Administered 2012-05-18 (×3): 30 mL via ORAL
  Filled 2012-05-18 (×3): qty 30

## 2012-05-18 MED ORDER — MORPHINE SULFATE 2 MG/ML IJ SOLN
INTRAMUSCULAR | Status: AC
  Filled 2012-05-18: qty 1

## 2012-05-18 MED ORDER — NITROGLYCERIN 1 MG/10 ML FOR IR/CATH LAB
INTRA_ARTERIAL | Status: AC
  Filled 2012-05-18: qty 10

## 2012-05-18 MED ORDER — VERAPAMIL HCL 2.5 MG/ML IV SOLN
INTRAVENOUS | Status: AC
  Filled 2012-05-18: qty 2

## 2012-05-18 MED ORDER — MORPHINE SULFATE 2 MG/ML IJ SOLN
2.0000 mg | Freq: Once | INTRAMUSCULAR | Status: AC
  Administered 2012-05-18: 2 mg via INTRAVENOUS

## 2012-05-18 MED ORDER — HEPARIN (PORCINE) IN NACL 2-0.9 UNIT/ML-% IJ SOLN
INTRAMUSCULAR | Status: AC
  Filled 2012-05-18: qty 1000

## 2012-05-18 MED ORDER — NITROGLYCERIN IN D5W 200-5 MCG/ML-% IV SOLN
INTRAVENOUS | Status: AC
  Filled 2012-05-18: qty 250

## 2012-05-18 MED ORDER — SODIUM CHLORIDE 0.9 % IV SOLN
INTRAVENOUS | Status: AC
  Administered 2012-05-18: 16:00:00 via INTRAVENOUS

## 2012-05-18 MED ORDER — CLOPIDOGREL BISULFATE 300 MG PO TABS
ORAL_TABLET | ORAL | Status: AC
  Administered 2012-05-19: 10:00:00 75 mg via ORAL
  Filled 2012-05-18: qty 1

## 2012-05-18 NOTE — Interval H&P Note (Signed)
History and Physical Interval Note:  05/18/2012 11:14 AM  Carla Hoover  has presented today for cardiac cath with the diagnosis of cp/cad/nstemi.  The various methods of treatment have been discussed with the patient and family. After consideration of risks, benefits and other options for treatment, the patient has consented to  Procedure(s): LEFT HEART CATHETERIZATION WITH CORONARY ANGIOGRAM (N/A) as a surgical intervention .  The patient's history has been reviewed, patient examined, no change in status, stable for surgery.  I have reviewed the patient's chart and labs.  Questions were answered to the patient's satisfaction.     MCALHANY,CHRISTOPHER

## 2012-05-18 NOTE — Progress Notes (Signed)
Patient: Carla Hoover / Admit Date: 05/17/2012 / Date of Encounter: 05/18/2012, 9:15 AM   Subjective  Having persistent 3/10 chest pressure currently. Pain has improved from admission, but "I can still feel it" and she is holding her fist over her chest. BP 120-140 systolic initially this AM. Received 1 SL NTG - BP down to 107/59. D/w MD. We attempted to get pain free with 2mg  IV morphine but BP dropped to 82/59, still having same chest pressure.   Objective   Telemetry: NSR F/u EKG this AM: NSR 89bpm, TW changes in avL known from before, no acute ST-T changes. Physical Exam: Filed Vitals:   05/18/12 0900  BP: 123/58  Pulse: 91  Temp: 97.7  Resp: 17, 100% [place]   General: Well developed WF in no acute distress laying flat in bed. Head: Normocephalic, atraumatic, sclera non-icteric, no xanthomas, nares are without discharge. Neck: Negative for carotid bruits. JVD not elevated. Lungs: Clear bilaterally to auscultation without wheezes, rales, or rhonchi. Breathing is unlabored. Heart: RRR S1 S2 without murmurs, rubs, or gallops.  Abdomen: Soft, non-distended with normoactive bowel sounds. She does have tenderness in the RUQ which she states has been present since last year. No obvious abdominal masses. Msk:  Strength and tone appear normal for age. Extremities: No clubbing or cyanosis. No edema.  Distal pedal pulses are 2+ and equal bilaterally. Neuro: Alert and oriented X 3. Moves all extremities spontaneously. Psych:  Responds to questions appropriately with a normal affect.    Intake/Output Summary (Last 24 hours) at 05/18/12 0915 Last data filed at 05/18/12 0900  Gross per 24 hour  Intake 1580.66 ml  Output    925 ml  Net 655.66 ml    Inpatient Medications:  . aspirin EC  81 mg Oral Daily  . clopidogrel  75 mg Oral Q breakfast  . darifenacin  15 mg Oral Daily  . FLUoxetine  20 mg Oral Daily  . imipramine  10 mg Oral TID  . metoprolol tartrate  25 mg Oral BID  .  mometasone-formoterol  2 puff Inhalation BID  . predniSONE  10 mg Oral BID  . simvastatin  40 mg Oral q1800  . sodium chloride  3 mL Intravenous Q12H    Labs:  Recent Labs  05/18/12 0512  NA 134*  K 3.6  CL 100  CO2 22  GLUCOSE 137*  BUN 15  CREATININE 1.34*  CALCIUM 9.0    Recent Labs  05/18/12 0512  WBC 6.2  HGB 12.0  HCT 33.9*  MCV 94.4  PLT 166    Recent Labs  05/17/12 2125 05/18/12 0512  CKTOTAL 135 109  CKMB 8.4* 8.5*  TROPONINI 0.93* 1.01*    Recent Labs  05/18/12 0512  CHOL 118  HDL 66  LDLCALC 38  TRIG 71  CHOLHDL 1.8    Radiology/Studies:  [facility] 05/17/12  CT chest  Impression:  1. Right upper lobe scarring. Coronary artery calcifications suggesting coronary artery disease. No acute abnormality seen in the chest.  CT abd/pelvis  Impression:  1. 3.4 cm infrarenal abdominal aortic aneurysm.  2. 21 mm ill-defined low density seen in dominant right hepatic lobe; further evaluation with MRI is recommended to rule out possible neoplasm CXR Impression:  No evidence of acute cardiopulmonary disease    Assessment and Plan  62 y/o F with history of CAD with PCI 04/2011, ICM EF 40-45% not on ACEI due to hypotension, BRBPR 04/2011 with heme neg stool at that time was transferred yesterday  from [facility] with weakness, palpitations, dizziness, and chest pain - she was taken to the ER at [facility] where she was found to be hypotensive with systolics in the 80s, heart rate around 100-110 in sinus rhythm. CE's here have been positive with troponin up to 1.   1. CAD/NSTEMI (history of overlapping BMS->LAD, chronically occluded small LCx 04/2011) - continue ASA, statin, BB, heparin. Await 2D Echo. Needs cath today. Risks and benefits of cardiac catheterization have been discussed with the patient.  These include bleeding, infection, kidney damage, stroke, heart attack, death.  The patient understands these risks and is willing to proceed. 2. ICM EF  40-45%, has not been on ACEI due to hypotension. 2D pending. Would not do LV gram due to Cr this admission.  3. Hypotension - improved with hydration but rx for chest pain caused another drop. Has recently been out of antihypertensives. Metoprolol continued this AM. Imdur and Lasix have been held. 4. Hyperlipidemia - continue statin. 5. Tobacco abuse - counseled against quitting.  6. Possible BRBPR 2013, heme neg stool at that time - denies symptoms of recurrence. 7. 3.4cm infrarenal AAA by CT at [facility] this admission - will need vascular followup. 8. R hepatic lobe density of uncertain significance by CT at [facility] this admission, MRI recommended to exclude neoplasm (not done due to Cr) - defer decision on workup until after cardiac eval due to renal function. She has had RUQ tenderness for about a year. 9. Acute renal insufficiency- improved with gentle hydration. Lasix held. Check UCx for possible yeast in UA. DC prednisone (not taking anymore). Follow Cr. 10. Elevated d-dimer at [facility] - CT angio was not done due to renal insufficiency. Has h/o elevated d-dimer with CTA neg for PE in 02/2012. Doubt PE, but if cath is unrevealing, would consider pursuing further w/u.  Signed, Ronie Spies PA-C  Patient seen, examined. Available data reviewed. Agree with findings, assessment, and plan as outlined by Ronie Spies, PA-C. Exam reveals an alert, oriented woman in no acute distress. She does have mild resting chest discomfort. Heart is regular rate and rhythm, lungs are clear, and there is no peripheral edema. Her EKG shows no acute changes. However, the patient has elevated troponin and residual chest discomfort. Her chest pain is unrelieved despite maximal medical therapy. She has known coronary artery disease has undergone overlapping bare-metal stent placement in the LAD. I think she clearly requires urgent cardiac catheterization in the setting of ongoing ischemic symptoms. I have reviewed the risks,  indications, and alternatives to cardiac catheterization and possible PCI. She understands and agrees to proceed. Her d-dimer is elevated and while this is a nonspecific finding, if she does not have obstructive CAD it would be reasonable to continue evaluation for pulmonary embolus. Case discussed with Dr Clifton James who will be doing her cath procedure. She has CKD so I would avoid LV-gram if possible.  Tonny Bollman, M.D. 05/18/2012 10:50 AM

## 2012-05-18 NOTE — H&P (View-Only) (Signed)
Patient: Carla Hoover / Admit Date: 05/17/2012 / Date of Encounter: 05/18/2012, 9:15 AM   Subjective  Having persistent 3/10 chest pressure currently. Pain has improved from admission, but "I can still feel it" and she is holding her fist over her chest. BP 120-140 systolic initially this AM. Received 1 SL NTG - BP down to 107/59. D/w MD. We attempted to get pain free with 2mg IV morphine but BP dropped to 82/59, still having same chest pressure.   Objective   Telemetry: NSR F/u EKG this AM: NSR 89bpm, TW changes in avL known from before, no acute ST-T changes. Physical Exam: Filed Vitals:   05/18/12 0900  BP: 123/58  Pulse: 91  Temp: 97.7  Resp: 17, 100% [place]   General: Well developed WF in no acute distress laying flat in bed. Head: Normocephalic, atraumatic, sclera non-icteric, no xanthomas, nares are without discharge. Neck: Negative for carotid bruits. JVD not elevated. Lungs: Clear bilaterally to auscultation without wheezes, rales, or rhonchi. Breathing is unlabored. Heart: RRR S1 S2 without murmurs, rubs, or gallops.  Abdomen: Soft, non-distended with normoactive bowel sounds. She does have tenderness in the RUQ which she states has been present since last year. No obvious abdominal masses. Msk:  Strength and tone appear normal for age. Extremities: No clubbing or cyanosis. No edema.  Distal pedal pulses are 2+ and equal bilaterally. Neuro: Alert and oriented X 3. Moves all extremities spontaneously. Psych:  Responds to questions appropriately with a normal affect.    Intake/Output Summary (Last 24 hours) at 05/18/12 0915 Last data filed at 05/18/12 0900  Gross per 24 hour  Intake 1580.66 ml  Output    925 ml  Net 655.66 ml    Inpatient Medications:  . aspirin EC  81 mg Oral Daily  . clopidogrel  75 mg Oral Q breakfast  . darifenacin  15 mg Oral Daily  . FLUoxetine  20 mg Oral Daily  . imipramine  10 mg Oral TID  . metoprolol tartrate  25 mg Oral BID  .  mometasone-formoterol  2 puff Inhalation BID  . predniSONE  10 mg Oral BID  . simvastatin  40 mg Oral q1800  . sodium chloride  3 mL Intravenous Q12H    Labs:  Recent Labs  05/18/12 0512  NA 134*  K 3.6  CL 100  CO2 22  GLUCOSE 137*  BUN 15  CREATININE 1.34*  CALCIUM 9.0    Recent Labs  05/18/12 0512  WBC 6.2  HGB 12.0  HCT 33.9*  MCV 94.4  PLT 166    Recent Labs  05/17/12 2125 05/18/12 0512  CKTOTAL 135 109  CKMB 8.4* 8.5*  TROPONINI 0.93* 1.01*    Recent Labs  05/18/12 0512  CHOL 118  HDL 66  LDLCALC 38  TRIG 71  CHOLHDL 1.8    Radiology/Studies:  [facility] 05/17/12  CT chest  Impression:  1. Right upper lobe scarring. Coronary artery calcifications suggesting coronary artery disease. No acute abnormality seen in the chest.  CT abd/pelvis  Impression:  1. 3.4 cm infrarenal abdominal aortic aneurysm.  2. 21 mm ill-defined low density seen in dominant right hepatic lobe; further evaluation with MRI is recommended to rule out possible neoplasm CXR Impression:  No evidence of acute cardiopulmonary disease    Assessment and Plan  62 y/o F with history of CAD with PCI 04/2011, ICM EF 40-45% not on ACEI due to hypotension, BRBPR 04/2011 with heme neg stool at that time was transferred yesterday   from [facility] with weakness, palpitations, dizziness, and chest pain - she was taken to the ER at [facility] where she was found to be hypotensive with systolics in the 80s, heart rate around 100-110 in sinus rhythm. CE's here have been positive with troponin up to 1.   1. CAD/NSTEMI (history of overlapping BMS->LAD, chronically occluded small LCx 04/2011) - continue ASA, statin, BB, heparin. Await 2D Echo. Needs cath today. Risks and benefits of cardiac catheterization have been discussed with the patient.  These include bleeding, infection, kidney damage, stroke, heart attack, death.  The patient understands these risks and is willing to proceed. 2. ICM EF  40-45%, has not been on ACEI due to hypotension. 2D pending. Would not do LV gram due to Cr this admission.  3. Hypotension - improved with hydration but rx for chest pain caused another drop. Has recently been out of antihypertensives. Metoprolol continued this AM. Imdur and Lasix have been held. 4. Hyperlipidemia - continue statin. 5. Tobacco abuse - counseled against quitting.  6. Possible BRBPR 2013, heme neg stool at that time - denies symptoms of recurrence. 7. 3.4cm infrarenal AAA by CT at [facility] this admission - will need vascular followup. 8. R hepatic lobe density of uncertain significance by CT at [facility] this admission, MRI recommended to exclude neoplasm (not done due to Cr) - defer decision on workup until after cardiac eval due to renal function. She has had RUQ tenderness for about a year. 9. Acute renal insufficiency- improved with gentle hydration. Lasix held. Check UCx for possible yeast in UA. DC prednisone (not taking anymore). Follow Cr. 10. Elevated d-dimer at [facility] - CT angio was not done due to renal insufficiency. Has h/o elevated d-dimer with CTA neg for PE in 02/2012. Doubt PE, but if cath is unrevealing, would consider pursuing further w/u.  Signed, Dayna Dunn PA-C  Patient seen, examined. Available data reviewed. Agree with findings, assessment, and plan as outlined by Dayna Dunn, PA-C. Exam reveals an alert, oriented woman in no acute distress. She does have mild resting chest discomfort. Heart is regular rate and rhythm, lungs are clear, and there is no peripheral edema. Her EKG shows no acute changes. However, the patient has elevated troponin and residual chest discomfort. Her chest pain is unrelieved despite maximal medical therapy. She has known coronary artery disease has undergone overlapping bare-metal stent placement in the LAD. I think she clearly requires urgent cardiac catheterization in the setting of ongoing ischemic symptoms. I have reviewed the risks,  indications, and alternatives to cardiac catheterization and possible PCI. She understands and agrees to proceed. Her d-dimer is elevated and while this is a nonspecific finding, if she does not have obstructive CAD it would be reasonable to continue evaluation for pulmonary embolus. Case discussed with Dr McAlhany who will be doing her cath procedure. She has CKD so I would avoid LV-gram if possible.  Ritchard Paragas, M.D. 05/18/2012 10:50 AM   

## 2012-05-18 NOTE — CV Procedure (Signed)
Cardiac Catheterization Operative Report  Carla Hoover [id number] 3/6/20141:03 PM VYAS,DHRUV B., MD  Procedure Performed:  1. Left Heart Catheterization 2. Selective Coronary Angiography 3. Left ventricular angiogram 4. PTCA/DES x 1 mid Circumflex artery 5. IVUS LAD 6. PTCA/DES x 1 proximal LAD  Operator: Verne Carrow, MD  Arterial access site:  Right radial artery.   Indication: 62 yo female with history of CAD with bare metal stent placement x 2 mid LAD 2013. Admitted with chest pain c/w unstable angina, elevated troponin.                                      Procedure Details: The risks, benefits, complications, treatment options, and expected outcomes were discussed with the patient. The patient and/or family concurred with the proposed plan, giving informed consent. The patient was brought to the cath lab after IV hydration was begun and oral premedication was given. The patient was further sedated with Versed and Fentanyl. The right wrist was assessed with an Allens test which was positive. The right wrist was prepped and draped in a sterile fashion. 1% lidocaine was used for local anesthesia. Using the modified Seldinger access technique, a 5 French sheath was placed in the right radial artery. 3 mg Verapamil was given through the sheath. 3500 units IV heparin was given. Standard diagnostic catheters were used to perform selective coronary angiography. I used an EZRAD left guide to engage the left main. A pigtail catheter was used to perform a left ventricular angiogram.  The patient was found to have severe stenosis in the stented segment of the mid LAD and a sub-total occlusion of the mid Circumflex. I elected to proceed to intervention of the Circumflex with plans to IVUS the LAD. The sheath was upsized to a 6 [place] system. I then engaged the left main with a XB 3.5 guiding catheter. She was given a bolus of Angiomax and a drip was started. I then passed a BMW wire  into the Circumflex but could not cross the sub-total occlusion. I then placed a long OTW balloon into the proximal Circumflex and used a Whisper wire to cross the stenosis. I then pre-dilated the stenosis with a 2.0 x 12 mm balloon x 3. I then deployed a 2.5 x 16 mm Promus Premier DES in the mid Circumflex. This was post-dilated with a 2.5 x 12 [place] balloon. The stenosis was taken from 99% down to 0%. There was excellent flow down the Circumflex. I then turned my attention to the LAD. A long BMW wire was advanced down the LAD. An IVUS catheter was then advanced down the LAD and an automated pullback was performed. The proximal portion of the stent had severe restenosis with a lumen area of 2.5 mm2. I then removed the IVUS catheter. A 2.5 x 12 mm balloon was used to pre-dilate the stenosis. A 3.0 x 16 mm Promus Premier DES was deployed in the proximal to mid LAD. This was post-dilated with a 3.25 x 12 mm [place] balloon was used to post-dilated the stent. The stenosis was taken from 75% down to 0%.   The sheath was removed from the right radial artery and a Terumo hemostasis band was applied at the arteriotomy site on the right wrist.   There were no immediate complications. The patient was taken to the recovery area in stable condition.   Hemodynamic Findings: Central aortic pressure: 127/66 Left  ventricular pressure: 140/18/23  Angiographic Findings:  Left main: Short segment with mild plaque.   Left Anterior Descending Artery:  Large caliber vessel that courses to the apex. There are patent overlapping stents in the mid vessel. The proximal portion of the stented segment has severe restenosis. The remainder of the LAD is free of significant disease. There is a moderate caliber diagonal branch with ostial 20% stenosis.   Circumflex Artery: Moderate caliber vessel with moderate sized first Obtuse marginal branch with no stenosis. Just beyond the first OM, there is a 99% sub-total occlusion. The second OM  branch is moderate in caliber supplying a large portion of myocardium with TIMI-1 flow. The AV groove Circumflex beyond the second OM branch is patent.   Right Coronary Artery:Large, dominant vessel with diffuse 30-40% stenosis in the mid vessel. The distal vessel has diffuse 20% stenosis. The PDA and posterolateral are moderate in caliber and no obstructive disease.   Left Ventricular Angiogram: LVEF=65%.   Impression: 1. Double vessel CAD with severe stenosis mid Circumflex and in the stented segment of the mid LAD 2. Successful PTCA/DES x 1 mid Circumflex 3. Successful PTCA/DS x 1 proximal to mid LAD 4. Normal LV systolic function.   Recommendations: She will need dual anti-platelet therapy for at least one year.        Complications:  None. The patient tolerated the procedure well.

## 2012-05-18 NOTE — Progress Notes (Signed)
dulera given

## 2012-05-18 NOTE — Progress Notes (Signed)
Pt continued to have unrelieved chest pressure despite morphine and nitro subingual. Pt emergently transported to cath lab. Dr Clifton James updated on medications and meal given this morning. Contact number for sister received to call and update when procedure is over.

## 2012-05-18 NOTE — Progress Notes (Signed)
Assumed care for this 7a-7p shift. Pt resting comfortably in bed, denies pain or discomfort. Plan of care discussed, all questions answered. Pt remains NPO at this time. Reviewed importance of notifying staff if chest pain returns and how to use call light for needs or assistance. Heparin gtt remains as documented. Monitoring closely.

## 2012-05-18 NOTE — Care Management Note (Signed)
    Page 1 of 1   05/18/2012     9:22:05 AM   CARE MANAGEMENT NOTE 05/18/2012  Patient:  TEIRA, ARCILLA   Account Number:  [id number]  Date Initiated:  05/18/2012  Documentation initiated by:  Junius Creamer  Subjective/Objective Assessment:   adm w elev troponins     Action/Plan:   lives w fam , pcp dr d vyyas   Anticipated DC Date:     Anticipated DC Plan:        DC Planning Services  CM consult      Choice offered to / List presented to:             Status of service:   Medicare Important Message given?   (If response is "NO", the following Medicare IM given date fields will be blank) Date Medicare IM given:   Date Additional Medicare IM given:    Discharge Disposition:    Per UR Regulation:  Reviewed for med. necessity/level of care/duration of stay  If discussed at Long Length of Stay Meetings, dates discussed:    Comments:  3/6 0921 debbie dowell rn,bsn

## 2012-05-18 NOTE — Consult Note (Signed)
ANTICOAGULATION CONSULT NOTE - Follow Up Consult  Pharmacy Consult for Heparin Indication: chest pain/ACS  Allergies  Allergen Reactions  . Penicillins Swelling    Per patient report, she has facial and tongue swellling  . Percocet (Oxycodone-Acetaminophen) Other (See Comments)    Hallucinations  . Percodan (Oxycodone-Aspirin) Other (See Comments)    Hallucinations    Patient Measurements: Height: 5' 4.17" (163 cm) Weight: 156 lb 15.5 oz (71.2 kg) IBW/kg (Calculated) : 55.1 Heparin Dosing Weight: 69kg  Vital Signs: Temp: 97.8 F (36.6 C) (03/05 1915) Temp src: Oral (03/05 1915) BP: 101/52 mmHg (03/06 0000) Pulse Rate: 84 (03/06 0000)  Labs:  Recent Labs  05/17/12 2125 05/18/12 0002  HEPARINUNFRC  --  0.69  CKTOTAL 135  --   CKMB 8.4*  --   TROPONINI 0.93*  --     Estimated Creatinine Clearance: 44.5 ml/min (by C-G formula based on Cr of 1.24).   Medical History: Past Medical History  Diagnosis Date  . Hypertension   . Anxiety   . Depression   . Arthritis   . Irritable bowel syndrome (IBS)   . Diverticulitis   . Insomnia   . Eczema   . Gout   . GI bleed     Possible mild GI bleed 04/2011, hemoccult negative at [facility] and no colonoscopy/EGD felt necessary by GI at that time  . CHF (congestive heart failure)     EF 40-45% by echo at [facility] 04/2011  . CAD (coronary artery disease)      s/p two overlapping BMS to LAD 05/12/11, Occluded mid LCX with collaterals (small caliber)  . Hyperlipidemia     Medications:  Heparin @ 1000 units/hr  Assessment: 62yof with hx CAD s/p PCI presented to [facility] with CP. Heparin drip was started there around 1830 (4000 unit bolus followed by 1000 units/hr). She was then transferred to [facility] for further management of her NSTEMI.  Heparin level (0.69) is at-goal on 1000 units/hr.   Goal of Therapy:  Heparin level 0.3-0.7 units/ml Monitor platelets by anticoagulation protocol: Yes   Plan:  1. Continue  IV heparin at 1000 units/hr  2. Daily CBC, heparin level.  Emeline Gins 05/18/2012,1:07 AM

## 2012-05-19 ENCOUNTER — Encounter (HOSPITAL_COMMUNITY): Payer: Self-pay | Admitting: Cardiovascular Disease

## 2012-05-19 DIAGNOSIS — I714 Abdominal aortic aneurysm, without rupture, unspecified: Secondary | ICD-10-CM

## 2012-05-19 DIAGNOSIS — K769 Liver disease, unspecified: Secondary | ICD-10-CM

## 2012-05-19 DIAGNOSIS — R160 Hepatomegaly, not elsewhere classified: Secondary | ICD-10-CM | POA: Diagnosis present

## 2012-05-19 DIAGNOSIS — E785 Hyperlipidemia, unspecified: Secondary | ICD-10-CM

## 2012-05-19 DIAGNOSIS — I369 Nonrheumatic tricuspid valve disorder, unspecified: Secondary | ICD-10-CM

## 2012-05-19 HISTORY — DX: Abdominal aortic aneurysm, without rupture, unspecified: I71.40

## 2012-05-19 HISTORY — DX: Abdominal aortic aneurysm, without rupture: I71.4

## 2012-05-19 LAB — CBC
HCT: 34.6 % — ABNORMAL LOW (ref 36.0–46.0)
Hemoglobin: 12 g/dL (ref 12.0–15.0)
MCH: 33.5 pg (ref 26.0–34.0)
MCH: 33.9 pg (ref 26.0–34.0)
MCHC: 34.7 g/dL (ref 30.0–36.0)
MCHC: 34.5 g/dL (ref 30.0–36.0)
MCV: 96.6 fL (ref 78.0–100.0)
MCV: 98.1 fL (ref 78.0–100.0)
Platelets: 190 10*3/uL (ref 150–400)
RBC: 3.58 MIL/uL — ABNORMAL LOW (ref 3.87–5.11)
RBC: 3.72 MIL/uL — ABNORMAL LOW (ref 3.87–5.11)

## 2012-05-19 LAB — BASIC METABOLIC PANEL
BUN: 17 mg/dL (ref 6–23)
CO2: 25 mEq/L (ref 19–32)
Calcium: 9.5 mg/dL (ref 8.4–10.5)
GFR calc non Af Amer: 52 mL/min — ABNORMAL LOW (ref >90)
Glucose, Bld: 88 mg/dL (ref 70–99)
Sodium: 132 mEq/L — ABNORMAL LOW (ref 135–145)

## 2012-05-19 LAB — COMPREHENSIVE METABOLIC PANEL
ALT: 5 U/L (ref 0–35)
AST: 15 U/L (ref 0–37)
Albumin: 3.2 g/dL — ABNORMAL LOW (ref 3.5–5.2)
CO2: 24 mEq/L (ref 19–32)
Calcium: 9.5 mg/dL (ref 8.4–10.5)
Creatinine, Ser: 1.08 mg/dL (ref 0.50–1.10)
GFR calc non Af Amer: 53 mL/min — ABNORMAL LOW (ref >90)
Sodium: 134 mEq/L — ABNORMAL LOW (ref 135–145)
Total Protein: 6.2 g/dL (ref 6.0–8.3)

## 2012-05-19 MED ORDER — MORPHINE SULFATE 2 MG/ML IJ SOLN
2.0000 mg | INTRAMUSCULAR | Status: DC | PRN
  Administered 2012-05-19: 01:00:00 2 mg via INTRAVENOUS
  Filled 2012-05-19: qty 1

## 2012-05-19 MED ORDER — SODIUM CHLORIDE 0.9 % IV SOLN
INTRAVENOUS | Status: AC
  Administered 2012-05-19: 09:00:00 via INTRAVENOUS

## 2012-05-19 MED ORDER — ISOSORBIDE MONONITRATE ER 60 MG PO TB24
60.0000 mg | ORAL_TABLET | Freq: Every day | ORAL | Status: DC
  Administered 2012-05-19 – 2012-05-20 (×2): 60 mg via ORAL
  Filled 2012-05-19 (×2): qty 1

## 2012-05-19 NOTE — Progress Notes (Signed)
  Echocardiogram 2D Echocardiogram has been performed.  Carla Hoover A 05/19/2012, 11:14 AM

## 2012-05-19 NOTE — Progress Notes (Signed)
CARDIAC REHAB PHASE I   PRE:  Rate/Rhythm: 71 sinus rhythm  BP:  Supine:   Sitting: 169/106  Standing:    SaO2: 995 ra  MODE:  Ambulation: 300 ft   POST:  Rate/Rhythem: 81 sinus rhythm  BP:  Supine:   Sitting: 162/111  Standing:    SaO2: 100% ra  [id number] Pt ambulated in hallway, slightly unsteady gait.  Pt encouraged to use her cane and walker at home.  Pt returned to chair, call light in reach.  Pt discussed psychosocial concerns causeing  her emotional stress.  Pt encouraged to contact her established therapist to help her cope with current issues.  Pt educated on importance of stress reduction as modifiable risk factor for CAD.  Pt states she has resumed smoking to "'calm her nerves"   Pt educated on importance of quitting for heart and lungs.  Smoking cessation information given, pt advised to call [phone].  Pt expressed readiness to quit.  Pt instructed on importance of medication compliance and states she has family who can pick up medications for her. Pt BP elevated at rest and with ambulation.  RN made aware.  Pt has stent booklet.  Pt oriented to phase II cardiac rehab. Pt requests referral to [facility] Outpatient cardiac rehab program.  Referral will be sent.   [place], [place]

## 2012-05-19 NOTE — Progress Notes (Signed)
SUBJECTIVE: Feeling better this am. One episode of indigestion and pressure last night, quickly relieved with morphine.   BP 141/66  Pulse 67  Temp(Src) 98.1 F (36.7 C) (Oral)  Resp 19  Ht 5' 4.17" (1.63 m)  Wt 154 lb 8.7 oz (70.1 kg)  BMI 26.38 kg/m2  SpO2 95%  Intake/Output Summary (Last 24 hours) at 05/19/12 0741 Last data filed at 05/18/12 1930  Gross per 24 hour  Intake 965.66 ml  Output    375 ml  Net 590.66 ml    PHYSICAL EXAM General: Well developed, well nourished, in no acute distress. Alert and oriented x 3.  Psych:  Good affect, responds appropriately Neck: No JVD. No masses noted.  Lungs: Clear bilaterally with no wheezes or rhonci noted.  Heart: RRR with no murmurs noted. Abdomen: Bowel sounds are present. Soft, non-tender.  Extremities: No lower extremity edema. Right wrist cath site ok.   LABS: Basic Metabolic Panel:  Recent Labs  65/78/46 0512  NA 134*  K 3.6  CL 100  CO2 22  GLUCOSE 137*  BUN 15  CREATININE 1.34*  CALCIUM 9.0   CBC:  Recent Labs  05/18/12 0512  WBC 6.2  HGB 12.0  HCT 33.9*  MCV 94.4  PLT 166   Cardiac Enzymes:  Recent Labs  05/17/12 2125 05/18/12 0512 05/18/12 1035  CKTOTAL 135 109 85  CKMB 8.4* 8.5* 7.0*  TROPONINI 0.93* 1.01* 0.93*   Fasting Lipid Panel:  Recent Labs  05/18/12 0512  CHOL 118  HDL 66  LDLCALC 38  TRIG 71  CHOLHDL 1.8    Current Meds: . aspirin EC  81 mg Oral Daily  . clopidogrel  75 mg Oral Q breakfast  . darifenacin  15 mg Oral Daily  . FLUoxetine  20 mg Oral Daily  . imipramine  10 mg Oral TID  . metoprolol tartrate  25 mg Oral BID  . mometasone-formoterol  2 puff Inhalation BID  . simvastatin  40 mg Oral q1800  . sodium chloride  3 mL Intravenous Q12H   Cardiac cath 05/18/12:  Left main: Short segment with mild plaque.  Left Anterior Descending Artery: Large caliber vessel that courses to the apex. There are patent overlapping stents in the mid vessel. The proximal  portion of the stented segment has severe restenosis. The remainder of the LAD is free of significant disease. There is a moderate caliber diagonal branch with ostial 20% stenosis.  Circumflex Artery: Moderate caliber vessel with moderate sized first Obtuse marginal branch with no stenosis. Just beyond the first OM, there is a 99% sub-total occlusion. The second OM branch is moderate in caliber supplying a large portion of myocardium with TIMI-1 flow. The AV groove Circumflex beyond the second OM branch is patent.  Right Coronary Artery:Large, dominant vessel with diffuse 30-40% stenosis in the mid vessel. The distal vessel has diffuse 20% stenosis. The PDA and posterolateral are moderate in caliber and no obstructive disease.  Left Ventricular Angiogram: LVEF=65%.    ASSESSMENT AND PLAN: 62 y/o F with history of CAD with PCI 04/2011, ICM EF 40-45% not on ACEI due to hypotension, BRBPR 04/2011 with heme neg stool at that time was transferred 05/17/12 from [facility] with weakness, palpitations, dizziness, and chest pain/NSTEMI- she was taken to the ER at [facility] where she was found to be hypotensive with systolics in the 80s, heart rate around 100-110 in sinus rhythm. CE's here have been positive with troponin up to 1.  1. CAD/NSTEMI: (history of overlapping BMS->LAD with high grade stenosis mid Circumflex by cath 2013 not treated as it was felt to have collateralization. S/p cath yesterday with sub-total occlusion of mid Circumflex leading into moderate sized obtuse marginal branch that had very little collateral filling. A DES was placed in the mid Circumflex with an excellent result. Her overlapping bare metal stents had high grade restenosis confirmed by IVUS and this was treated with a DES x 1 in the proximal to mid LAD. Her chest pain has resolved this am. LV function is normal by LVgram.  -She will need dual anti-platelet therapy with ASA and Plavix for at least one year. Continue statin, beta  blocker.   2. Ischemic cardiomyopathy: LVEF 65% by LVgram yesterday. Resume Ace-inhibitor before d/c if renal function is ok.   3. Hypotension: Resolved post cath/PCI.  4. Hyperlipidemia: continue statin.   5. Tobacco abuse: counseled on cessation.    6. Infrarenal AAA by CT at [facility] this admission,3.4cm: Will need to have repeat abdominal u/s in 6 months in the [facility] office.    7. Right hepatic lobe density of uncertain significance by CT at [facility] this admission:  MRI recommended to exclude neoplasm (not done due to Cr): This will need to be planned as an outpatient and f/u will need to be planned in primary care.   9. Acute renal insufficiency: BMET pending this am. Will follow  10. Dispo: Will hydrate today, Echo today. Anticipate d/c tomorrow.     Carla Hoover  3/7/20147:41 AM

## 2012-05-19 NOTE — Progress Notes (Signed)
Pt. Had episode of chest pressure scale of 5/10 as described and in the lower mid chest to the left, underneath the breast. Given 2 doses of NTG SL with no relief. 12 lead EKG done  which shows NSR no changes. BP stable. O2 at 2l maintained, Dr. Shirlee Latch made aware of pts. symptom with orders made. Morphine 2mg . IV given. According to pt. she has morphine prior to admission and she did ok. Will cont to monitor pt.

## 2012-05-20 ENCOUNTER — Encounter (HOSPITAL_COMMUNITY): Payer: Self-pay | Admitting: Physician Assistant

## 2012-05-20 ENCOUNTER — Other Ambulatory Visit: Payer: Self-pay | Admitting: Physician Assistant

## 2012-05-20 DIAGNOSIS — I714 Abdominal aortic aneurysm, without rupture: Secondary | ICD-10-CM

## 2012-05-20 LAB — BASIC METABOLIC PANEL
CO2: 22 mEq/L (ref 19–32)
Calcium: 9.7 mg/dL (ref 8.4–10.5)
Creatinine, Ser: 1.11 mg/dL — ABNORMAL HIGH (ref 0.50–1.10)
GFR calc non Af Amer: 51 mL/min — ABNORMAL LOW (ref >90)
Glucose, Bld: 102 mg/dL — ABNORMAL HIGH (ref 70–99)
Sodium: 133 mEq/L — ABNORMAL LOW (ref 135–145)

## 2012-05-20 MED ORDER — CLOPIDOGREL BISULFATE 75 MG PO TABS: 75.0000 mg | ORAL_TABLET | Freq: Every day | ORAL | Status: DC

## 2012-05-20 MED ORDER — PRAVASTATIN SODIUM 80 MG PO TABS: 80.0000 mg | ORAL_TABLET | Freq: Every day | ORAL | Status: DC

## 2012-05-20 MED ORDER — SOLIFENACIN SUCCINATE 10 MG PO TABS: 5.0000 mg | ORAL_TABLET | Freq: Every day | ORAL | Status: DC

## 2012-05-20 NOTE — Discharge Summary (Signed)
See progress notes Brian Crenshaw  

## 2012-05-20 NOTE — Progress Notes (Signed)
SUBJECTIVE: No chest pain or dyspnea  BP 157/74  Pulse 60  Temp(Src) 98.2 F (36.8 C) (Oral)  Resp 20  Ht 5' 4.17" (1.63 m)  Wt 149 lb 4 oz (67.7 kg)  BMI 25.48 kg/m2  SpO2 100%  Intake/Output Summary (Last 24 hours) at 05/20/12 0815 Last data filed at 05/19/12 2000  Gross per 24 hour  Intake   1320 ml  Output      0 ml  Net   1320 ml    PHYSICAL EXAM General: Well developed, well nourished, in no acute distress. Alert and oriented x 3.  Neck: No JVD. Lungs: Clear bilaterally with no wheezes or rhonci noted.  Heart: RRR with no murmurs noted. Abdomen:  Soft, non-tender.  Extremities: No lower extremity edema. Right wrist cath site ok.   LABS: Basic Metabolic Panel:  Recent Labs  40/98/11 0910 05/20/12 0650  NA 132* 133*  K 4.6 4.0  CL 98 100  CO2 25 22  GLUCOSE 88 102*  BUN 17 15  CREATININE 1.10 1.11*  CALCIUM 9.5 9.7   CBC:  Recent Labs  05/19/12 0710 05/19/12 0910  WBC 6.6 6.8  HGB 12.0 12.6  HCT 34.6* 36.5  MCV 96.6 98.1  PLT 163 190   Cardiac Enzymes:  Recent Labs  05/17/12 2125 05/18/12 0512 05/18/12 1035  CKTOTAL 135 109 85  CKMB 8.4* 8.5* 7.0*  TROPONINI 0.93* 1.01* 0.93*   Fasting Lipid Panel:  Recent Labs  05/18/12 0512  CHOL 118  HDL 66  LDLCALC 38  TRIG 71  CHOLHDL 1.8    Current Meds: . aspirin EC  81 mg Oral Daily  . clopidogrel  75 mg Oral Q breakfast  . darifenacin  15 mg Oral Daily  . FLUoxetine  20 mg Oral Daily  . imipramine  10 mg Oral TID  . isosorbide mononitrate  60 mg Oral Daily  . metoprolol tartrate  25 mg Oral BID  . mometasone-formoterol  2 puff Inhalation BID  . simvastatin  40 mg Oral q1800  . sodium chloride  3 mL Intravenous Q12H   Cardiac cath 05/18/12:  Left main: Short segment with mild plaque.  Left Anterior Descending Artery: Large caliber vessel that courses to the apex. There are patent overlapping stents in the mid vessel. The proximal portion of the stented segment has severe  restenosis. The remainder of the LAD is free of significant disease. There is a moderate caliber diagonal branch with ostial 20% stenosis.  Circumflex Artery: Moderate caliber vessel with moderate sized first Obtuse marginal branch with no stenosis. Just beyond the first OM, there is a 99% sub-total occlusion. The second OM branch is moderate in caliber supplying a large portion of myocardium with TIMI-1 flow. The AV groove Circumflex beyond the second OM branch is patent.  Right Coronary Artery:Large, dominant vessel with diffuse 30-40% stenosis in the mid vessel. The distal vessel has diffuse 20% stenosis. The PDA and posterolateral are moderate in caliber and no obstructive disease.  Left Ventricular Angiogram: LVEF=65%.    ASSESSMENT AND PLAN: 62 y/o F with history of CAD with PCI 04/2011, ICM EF 40-45% not on ACEI due to hypotension, BRBPR 04/2011 with heme neg stool at that time was transferred 05/17/12 from [facility] with weakness, palpitations, dizziness, and chest pain/NSTEMI- she was taken to the ER at [facility] where she was found to be hypotensive with systolics in the 80s, heart rate around 100-110 in sinus rhythm. CE's here have been positive  with troponin up to 1.   1. CAD/NSTEMI: (history of overlapping BMS->LAD with high grade stenosis mid Circumflex by cath 2013 not treated as it was felt to have collateralization. S/p cath  with sub-total occlusion of mid Circumflex leading into moderate sized obtuse marginal branch that had very little collateral filling. A DES was placed in the mid Circumflex with an excellent result. Her overlapping bare metal stents had high grade restenosis confirmed by IVUS and this was treated with a DES x 1 in the proximal to mid LAD.   -She will need dual anti-platelet therapy with ASA and Plavix for at least one year. Continue statin, beta blocker.   2. Ischemic cardiomyopathy: LVEF 65% by LVgram yesterday. Resume Ace-inhibitor  3. Hypotension: Resolved  post cath/PCI.  4. Hyperlipidemia: continue statin.   5. Tobacco abuse: counseled on cessation.    6. Infrarenal AAA by CT at [facility] this admission,3.4cm: Will need to have repeat abdominal u/s in 6 months in the [facility] office.    7. Right hepatic lobe density of uncertain significance by CT at [facility] this admission:  MRI recommended to exclude neoplasm (not done due to Cr): This will need to be planned as an outpatient and f/u will need to be planned in primary care.   9. Acute renal insufficiency: Renal function ok this AM  10. Dispo: DC today; fu with Dr Antoine Poche in 2-4 weeks >30 min PA and physician time D2    Olga Millers  3/8/20148:15 AM

## 2012-05-20 NOTE — Discharge Summary (Signed)
Discharge Summary   Patient ID: Carla Hoover MRN: [id number], DOB/AGE: 1950-10-12 62 y.o. Admit date: 05/17/2012 D/C date:     05/20/2012  Primary Cardiologist: Hochrein  Primary Discharge Diagnoses:  1. CAD/NSTEMI this admission - PTCA/DES to mid Cx and PTCA/DES to prox-mid LAD 05/18/12 - history: overlapping BMS->LAD, chronically occluded small LCx 04/2011 2. H/o prior ischemic cardiomyopathy, improved - LVEF 65% by LVgram this admission 3. Hypotension, resolved post-PCI   4. Hyperlipidemia  5. Tobacco abuse 6. Infrarenal AAA by CT at [facility] this admission,3.4cm, will need repeat abdominal u/s in 6 months in the [facility] office.  7. Right hepatic lobe density of uncertain significance by CT at [facility] this admission: MRI recommended to exclude neoplasm (not done due to Cr) - will need f/u by PCP  9. Acute renal insufficiency 10. HL  Secondary Discharge Diagnoses:  1. Urinary incontinence 2. HTN 3. Anxiety/depression 4. IBS 5. Diverticulitis 6. Insomnia 7. Eczema 8. Gout 10. Hematochezia 04/2011 - hemoccult negative at [facility] and no colonoscopy/EGD felt necessary by GI at that time  Hospital Course: Ms. Cavendish is a 62 y/o F with history of CAD (s/p 2 overlapping BMS to LAD 04/2011, chronically occluded small LCx), ICM (EF 40-45%), HTN, HLD, tobacco abuse, IBS, and Depression/Anxiety who transferred from [facility] to [facility] on 05/17/2012 with complaints of chest pain, weakness, and dizziness while running errands. She took SL NTG and went to the ER. In the [facility] ED, She developed hypotension with systolics in the 80s, heart rate around 100-110 in sinus rhythm and was given IVF bolus. EKG not show acute ST segment abnormalities with nonspecific ST-T wave changes in the lateral leads that were old. Cardiac markers were however abnormal with troponin I of 0.18 to 0.45. Chest x-ray showed no acute process. She did have a noncontrasted CT of the chest and abdomen that  found an incidentally noted 3.4 cm infrarenal AAA, also an ill-defined low density seen in the right hepatic lobe of uncertain significance. She was not given contrast secondary to renal insufficiency, creatinine 1.7 which was new. She received IV hydration and was transferred to [facility] where she continued to rule in for MI with troponin of 1. She was pain free on arrival, but the following morning developed chest pain resistent to medical therapy. She was taken for urgent cath with results: 1. Double vessel CAD with severe stenosis mid Circumflex and in the stented segment of the mid LAD  2. Successful PTCA/DES x 1 mid Circumflex  3. Successful PTCA/DS x 1 proximal to mid LAD  4. Normal LV systolic function.  DAPT for at least 1 year was recommended. Renal function improved with discharge Cr of 1.11. She was not on ACEI prior to admission and will not start at discharge due to her renal function this admission. She will need repeat abdominal u/s in 6 months in the [facility] office for her AAA. She was also informed of her hepatic lobe lesion and was instructed to f/u with PCP regarding further imaging. She also had possible UTI by [facility] labs but her UA here was nitrite negative and was instructed to f/u PCP regarding this as well. She was not acutely symptomatic with this (has h/o chronic urinary incontinence). Dr. Jens Som has seen and examined her today and feels she is stable for discharge.   Discharge Vitals: Blood pressure 157/74, pulse 60, temperature 98.2 F (36.8 C), temperature source Oral, resp. rate 20, height 5' 4.17" (1.63 m), weight 149  lb 4 oz (67.7 kg), SpO2 100.00%.  Labs: Lab Results  Component Value Date   WBC 6.8 05/19/2012   HGB 12.6 05/19/2012   HCT 36.5 05/19/2012   MCV 98.1 05/19/2012   PLT 190 05/19/2012    Recent Labs Lab 05/19/12 0710  05/20/12 0650  NA 134*  < > 133*  K 4.1  < > 4.0  CL 100  < > 100  CO2 24  < > 22  BUN 18  < > 15  CREATININE 1.08  < > 1.11*    CALCIUM 9.5  < > 9.7  PROT 6.2  --   --   BILITOT 0.4  --   --   ALKPHOS 52  --   --   ALT 5  --   --   AST 15  --   --   GLUCOSE 90  < > 102*  < > = values in this interval not displayed.  Recent Labs  05/17/12 2125 05/18/12 0512 05/18/12 1035  CKTOTAL 135 109 85  CKMB 8.4* 8.5* 7.0*  TROPONINI 0.93* 1.01* 0.93*   Lab Results  Component Value Date   CHOL 118 05/18/2012   HDL 66 05/18/2012   LDLCALC 38 05/18/2012   TRIG 71 05/18/2012    Diagnostic Studies/Procedures   1. Cardiac catheterization this admission, please see full report and above for summary.  2. 2D Echo 05/19/12 Study Conclusions - Left ventricle: The cavity size was mildly dilated. Wall thickness was increased in a pattern of moderate LVH. The estimated ejection fraction was 55%.  - Mitral valve: Calcified annulus. - Left atrium: The atrium was mildly dilated. - Atrial septum: No defect or patent foramen ovale was identified. - Pericardium, extracardiac: A trivial pericardial effusion was identified.  See H&P for studies done at [facility]  Discharge Medications     Medication List    TAKE these medications       ADVAIR DISKUS 100-50 MCG/DOSE Aepb  Generic drug:  Fluticasone-Salmeterol  Inhale 2 puffs into the lungs every 12 (twelve) hours.     aspirin 81 MG tablet  Take 81 mg by mouth daily.     clopidogrel 75 MG tablet  Commonly known as:  PLAVIX  Take 1 tablet (75 mg total) by mouth daily.     FLUoxetine 20 MG capsule  Commonly known as:  PROZAC  Take 20 mg by mouth daily.     furosemide 20 MG tablet  Commonly known as:  LASIX  Take 20 mg by mouth daily as needed. For fluid     imipramine 10 MG tablet  Commonly known as:  TOFRANIL  Take 10 mg by mouth 3 (three) times daily.     isosorbide mononitrate 60 MG 24 hr tablet  Commonly known as:  IMDUR  TAKE 1 TABLET BY MOUTH EVERY DAY     LORazepam 1 MG tablet  Commonly known as:  ATIVAN  Take 1 tablet (1 mg total) by mouth every 6 (six)  hours as needed for anxiety.     metoprolol tartrate 25 MG tablet  Commonly known as:  LOPRESSOR  Take 1 tablet (25 mg total) by mouth 2 (two) times daily.     nitroGLYCERIN 0.4 MG SL tablet  Commonly known as:  NITROSTAT  Place 0.4 mg under the tongue every 5 (five) minutes as needed for chest pain. For chest pain     pravastatin 80 MG tablet  Commonly known as:  PRAVACHOL  Take 1 tablet (80  mg total) by mouth daily.     solifenacin 10 MG tablet  Commonly known as:  VESICARE  Take 0.5 tablets (5 mg total) by mouth daily.        Disposition   The patient will be discharged in stable condition to home. Discharge Orders   Future Appointments Provider Department Dept Phone   05/24/2012 9:00 AM Vickki Hearing, MD [facility] [phone]   Future Orders Complete By Expires     Amb Referral to Cardiac Rehabilitation  As directed     Diet - low sodium heart healthy  As directed     Discharge instructions  As directed     Comments:      Please call primary care doctor for an appointment to discuss the following:  - your CT scan at [facility] suggested an abnormal density on your liver that may need further workup - you may need a repeat urinalysis to check your urine    Increase activity slowly  As directed     Comments:      No driving for 1 week. No lifting over 10 lbs for 2 weeks. No sexual activity for 2 weeks. Keep procedure site clean & dry. If you notice increased pain, swelling, bleeding or pus, call/return!  You may shower, but no soaking baths/hot tubs/pools for 1 week.      Follow-up Information   Follow up with VYAS,DHRUV B., MD. (See discharge instructions section)    Contact information:   [address] [place] [address] [phone]       Follow up with [facility]. (Our office will call you for a followup appointment. We will also be scheduling a followup ultrasound to evaluate your abdominal aortic aneruysm in 6  months.)    Contact information:   [address] [place] [address] [phone]        Duration of Discharge Encounter: Greater than 30 minutes including physician and PA time.  Signed, Ronie Spies PA-C 05/20/2012, 8:57 AM

## 2012-05-22 ENCOUNTER — Telehealth: Payer: Self-pay | Admitting: Physician Assistant

## 2012-05-22 MED FILL — Dextrose Inj 5%: INTRAVENOUS | Qty: 50 | Status: AC

## 2012-05-22 NOTE — Telephone Encounter (Signed)
TCM PATIENT post cath per [facility] PA Needs telephone call

## 2012-05-23 NOTE — Telephone Encounter (Signed)
Attempted to call patient - received message on phone - person is unavailable.

## 2012-05-24 ENCOUNTER — Ambulatory Visit: Payer: Medicaid Other | Admitting: Orthopedic Surgery

## 2012-05-25 ENCOUNTER — Ambulatory Visit (INDEPENDENT_AMBULATORY_CARE_PROVIDER_SITE_OTHER): Payer: Medicaid Other | Admitting: Physician Assistant

## 2012-05-25 ENCOUNTER — Encounter: Payer: Self-pay | Admitting: Physician Assistant

## 2012-05-25 VITALS — BP 138/91 | HR 73 | Ht 64.0 in | Wt 149.8 lb

## 2012-05-25 DIAGNOSIS — E785 Hyperlipidemia, unspecified: Secondary | ICD-10-CM

## 2012-05-25 DIAGNOSIS — I2510 Atherosclerotic heart disease of native coronary artery without angina pectoris: Secondary | ICD-10-CM

## 2012-05-25 DIAGNOSIS — R160 Hepatomegaly, not elsewhere classified: Secondary | ICD-10-CM

## 2012-05-25 DIAGNOSIS — F17200 Nicotine dependence, unspecified, uncomplicated: Secondary | ICD-10-CM | POA: Insufficient documentation

## 2012-05-25 DIAGNOSIS — N289 Disorder of kidney and ureter, unspecified: Secondary | ICD-10-CM | POA: Insufficient documentation

## 2012-05-25 DIAGNOSIS — I714 Abdominal aortic aneurysm, without rupture: Secondary | ICD-10-CM

## 2012-05-25 HISTORY — DX: Nicotine dependence, unspecified, uncomplicated: F17.200

## 2012-05-25 MED ORDER — METOPROLOL TARTRATE 50 MG PO TABS: 50.0000 mg | ORAL_TABLET | Freq: Two times a day (BID) | ORAL | Status: DC

## 2012-05-25 NOTE — Assessment & Plan Note (Signed)
Will discontinue Lasix. Patient has normal LVF and no signs/symptoms of CHF, either by history and exam. Moreover, she recently presented with elevated creatinine 1.8, prior to catheterization. We'll check followup metabolic profile.

## 2012-05-25 NOTE — Patient Instructions (Signed)
   Lab today for [facility] will contact with results  Stop Lasix  Increase Lopressor to 50mg  twice a day - new sent to pharm  Continue all other current medications. Abdominal ultrasound - due in 6 months - will send reminder in mail Follow up in  2 months

## 2012-05-25 NOTE — Assessment & Plan Note (Signed)
Recent LDL 38. Continue full dose pravastatin.

## 2012-05-25 NOTE — Assessment & Plan Note (Signed)
Patient to follow with primary M.D., regarding further evaluation and recommendations.

## 2012-05-25 NOTE — Progress Notes (Signed)
Primary Cardiologist: Rollene Rotunda, MD   HPI: Post hospital followup from [facility], status post NST EMI treated with DES of CFX and LAD, March 6.  She initially presented here at [facility] ED with multiple complaints, but including CP. Troponins abnormal at 0.18 followed by 0.45. She was started on IV heparin and transferred directly to [facility]. Secondary to refractory CP, she was taken urgently to the catheterization lab the following morning, with results as follows:    - 2v CAD with severe stenosis of mid CFX and in proximal portion of mid LAD stent ; EF 65%  Hospital course also notable for renal insufficiency, with improved creatinine of 1.1 at time of discharge (1.8 prior to transfer to [facility]). She also had incidental finding of a 3.4 cm infrarenal AAA, by noncontrasted CT scan, at [facility]. Recommendation was for a followup abdominal ultrasound in 6 months. There was also incidental finding of right hepatic lobe density of uncertain significance, again by CT at [facility]. A followup MRI was recommended to exclude neoplasm, with recommendation for patient to followup with primary M.D. for this.  Clinically, she complains of feeling "tired". She also states that her legs feel like "jelly", and tripped on her carpeting the other day, inducing some bruising of her right forearm. She denied any frank syncope. Regarding chest pain, she's had some occasional brief episodes, characterized as sharp. One recent episode occurred while she was sitting watching television. She took 1 NTG tablet with partial relief. She also complains of "bad indigestion". She denies any symptoms similar to her recent presentation here at [facility] ED.  Allergies  Allergen Reactions  . Penicillins Swelling    Per patient report, she has facial and tongue swellling  . Percocet (Oxycodone-Acetaminophen) Other (See Comments)    Hallucinations  . Percodan (Oxycodone-Aspirin) Other (See Comments)    Hallucinations    Current Outpatient Prescriptions   Medication Sig Dispense Refill  . aspirin 81 MG tablet Take 81 mg by mouth daily.      . clopidogrel (PLAVIX) 75 MG tablet Take 1 tablet (75 mg total) by mouth daily.  30 tablet  11  . FLUoxetine (PROZAC) 20 MG capsule Take 20 mg by mouth daily.      . Fluticasone-Salmeterol (ADVAIR DISKUS) 100-50 MCG/DOSE AEPB Inhale 2 puffs into the lungs every 12 (twelve) hours.      Marland Kitchen imipramine (TOFRANIL) 10 MG tablet Take 10 mg by mouth 3 (three) times daily.      . isosorbide mononitrate (IMDUR) 60 MG 24 hr tablet TAKE 1 TABLET BY MOUTH EVERY DAY  30 tablet  3  . LORazepam (ATIVAN) 1 MG tablet Take 1 tablet (1 mg total) by mouth every 6 (six) hours as needed for anxiety.  30 tablet  0  . metoprolol tartrate (LOPRESSOR) 50 MG tablet Take 1 tablet (50 mg total) by mouth 2 (two) times daily.  60 tablet  6  . nitroGLYCERIN (NITROSTAT) 0.4 MG SL tablet Place 0.4 mg under the tongue every 5 (five) minutes as needed for chest pain. For chest pain      . pravastatin (PRAVACHOL) 80 MG tablet Take 1 tablet (80 mg total) by mouth daily.      . solifenacin (VESICARE) 10 MG tablet Take 0.5 tablets (5 mg total) by mouth daily.      . [DISCONTINUED] carvedilol (COREG) 3.125 MG tablet Take 1 tablet (3.125 mg total) by mouth 2 (two) times daily with a meal.  60 tablet  6  . [DISCONTINUED] citalopram (  CELEXA) 20 MG tablet Take 1 tablet (20 mg total) by mouth daily.  30 tablet  1   No current facility-administered medications for this visit.    Past Medical History  Diagnosis Date  . Hypertension     a. Hypotension noted 05/2012.  Marland Kitchen Anxiety   . Depression   . Arthritis   . Irritable bowel syndrome (IBS)   . Diverticulitis   . Insomnia   . Eczema   . Gout   . GI bleed     Possible mild GI bleed 04/2011, hemoccult negative at [facility] and no colonoscopy/EGD felt necessary by GI at that time  . Ischemic cardiomyopathy     IMPROVED. a. EF 40-45% by echo at [facility] 04/2011. b. Improved to 55% by echo 05/2012.   Marland Kitchen CAD (coronary artery disease)     a. s/p two overlapping BMS to LAD 05/12/11, Occluded mid LCX with collaterals. b. NSTEMI 05/2012: PTCA/DES to mid Cx and PTCA/DES to prox-mid LAD 05/18/12.  Marland Kitchen Hyperlipidemia   . AAA (abdominal aortic aneurysm) 05/19/2012    a. Infrarenal AAA by CT at [facility] 3.4cm 05/2012.  . Tobacco abuse   . Hepatic lesion     a. Right hepatic lobe density of uncertain significance by CT at [facility] 05/2012 - needs f/u PCP.  Marland Kitchen Acute renal insufficiency     05/2012 - Cr 1.7. Improved.  . Tobacco use disorder 05/25/2012    Past Surgical History  Procedure Laterality Date  . Abdominal hysterectomy    . Cardiac catheterization    . Coronary angioplasty  05/2011    2 BMS to mid LAD    History   Social History  . Marital Status: Divorced    Spouse Name: N/A    Number of Children: N/A  . Years of Education: N/A   Occupational History  . Not on file.   Social History Main Topics  . Smoking status: Current Every Day Smoker -- 50 years    Types: Cigarettes  . Smokeless tobacco: Never Used  . Alcohol Use: 1.2 oz/week    2 Glasses of wine per week  . Drug Use: No  . Sexually Active: Not on file   Other Topics Concern  . Not on file   Social History Narrative  . No narrative on file    No family history on file.  ROS: no nausea, vomiting; no fever, chills; no melena, hematochezia; no claudication  PHYSICAL EXAM: BP 138/91  Pulse 73  Ht 5\' 4"  (1.626 m)  Wt 149 lb 12.8 oz (67.949 kg)  BMI 25.7 kg/m2  SpO2 99% GENERAL: 62 year old female, somewhat disheveled appearing; NAD HEENT: NCAT, PERRLA, EOMI; sclera clear; no xanthelasma NECK: palpable bilateral carotid pulses, no bruits; no JVD; no TM LUNGS: CTA bilaterally CARDIAC: RRR (S1, S2); no significant murmurs; no rubs or gallops ABDOMEN: soft, non-tender; intact BS EXTREMETIES: Palpable right RP; no peripheral edema SKIN: Dry and scaly; small areas of ecchymosis on right forearm MUSCULOSKELETAL: no  joint deformity NEURO: no focal deficit; NL affect   EKG:    ASSESSMENT & PLAN:  CAD (coronary artery disease) Patient to remain on DAPT for at least one year, if not longer. Aggressive risk factor modification strongly advised, particularly regarding smoking. Will increase Lopressor to 50 twice a day, for better control of BP and basal heart rate. Reassess clinical status in 2 months, with Dr. Antoine Poche.  Tobacco use disorder Although she currently smokes an occasional cigarette, she was strongly advised to stop  altogether.  Hyperlipidemia Recent LDL 38. Continue full dose pravastatin.  AAA (abdominal aortic aneurysm) Recommended followup abdominal ultrasound in 6 months.  Liver mass Patient to follow with primary M.D., regarding further evaluation and recommendations.  Renal insufficiency Will discontinue Lasix. Patient has normal LVF and no signs/symptoms of CHF, either by history and exam. Moreover, she recently presented with elevated creatinine 1.8, prior to catheterization. We'll check followup metabolic profile.     Gene Serpe, PAC

## 2012-05-25 NOTE — Telephone Encounter (Signed)
Patient in office today for OV with Gene Serpe, PA.   

## 2012-05-25 NOTE — Assessment & Plan Note (Signed)
Recommended followup abdominal ultrasound in 6 months.

## 2012-05-25 NOTE — Assessment & Plan Note (Signed)
Patient to remain on DAPT for at least one year, if not longer. Aggressive risk factor modification strongly advised, particularly regarding smoking. Will increase Lopressor to 50 twice a day, for better control of BP and basal heart rate. Reassess clinical status in 2 months, with Dr. Antoine Poche.

## 2012-05-25 NOTE — Assessment & Plan Note (Signed)
Although she currently smokes an occasional cigarette, she was strongly advised to stop altogether.

## 2012-06-05 ENCOUNTER — Ambulatory Visit: Payer: Medicaid Other | Admitting: Orthopedic Surgery

## 2012-06-05 ENCOUNTER — Encounter: Payer: Self-pay | Admitting: Cardiology

## 2012-06-13 ENCOUNTER — Telehealth: Payer: Self-pay | Admitting: *Deleted

## 2012-06-13 NOTE — Telephone Encounter (Signed)
Left message for patient to call office r/e message she left on nurse's voicemail about a letter she received saying she need to schedule an appointment for an aorta. Nurse awaiting call back from patient.

## 2012-06-15 ENCOUNTER — Ambulatory Visit (INDEPENDENT_AMBULATORY_CARE_PROVIDER_SITE_OTHER): Payer: Medicaid Other

## 2012-06-15 ENCOUNTER — Ambulatory Visit (INDEPENDENT_AMBULATORY_CARE_PROVIDER_SITE_OTHER): Payer: Medicaid Other | Admitting: Orthopedic Surgery

## 2012-06-15 ENCOUNTER — Encounter: Payer: Self-pay | Admitting: Orthopedic Surgery

## 2012-06-15 VITALS — BP 130/70 | Ht 64.0 in | Wt 149.0 lb

## 2012-06-15 DIAGNOSIS — M67431 Ganglion, right wrist: Secondary | ICD-10-CM

## 2012-06-15 DIAGNOSIS — M67439 Ganglion, unspecified wrist: Secondary | ICD-10-CM | POA: Insufficient documentation

## 2012-06-15 DIAGNOSIS — M6740 Ganglion, unspecified site: Secondary | ICD-10-CM

## 2012-06-15 NOTE — Progress Notes (Signed)
Patient ID: Carla Hoover, female   DOB: [birth year], 62 y.o.   MRN: [id number] Chief Complaint  Patient presents with  . Wrist Pain    Right wrist cyst Referred by Dr. Sherril Croon    History this is a 62 year old female said a mass developing over the volar aspect of her right wrist for 4 years. Her symptoms started gradually and have increased to the point where now she has sharp throbbing pain 5/10 constant over the right upper extremity with some tingling at night. She also complains of some catching and locking of the fingers.  The mass is increased in size.  She was going to see me sooner but she developed congestive heart failure she's recently had stents placed about 4 weeks ago. She would like the mass excised  Review of systems she reports changes in her weight, fever chills and fatigue blurred vision eye pain and watering chest pain, shortness of breath, tightness in the chest, snoring heartburn constipation frequency urgency muscle pain joint pain changes in skin, poor healing, itching, dizziness depression anxiety nervousness and easy bleeding and bruising excessive thirst and seasonal allergy.  History of penicillin allergy Percocet and Percodan allergy  History of congestive heart failure diverticulitis hepatic disease COPD abdominal aortic aneurysm   Recently had balloon plasty and stent placement. She's had a D&C. She's had a hysterectomy. Pharmacy drug  Heart disease lung disease arthritis and cancer as part of her family history  She is retired divorced doesn't smoke has an occasional drink has her GED  BP 130/70  Ht 5\' 4"  (1.626 m)  Wt 149 lb (67.586 kg)  BMI 25.56 kg/m2 General appearance is normal, the patient is alert and oriented x3 with normal mood and affect. There is a large mass over the volar aspect of the right wrist over the radial artery which is nonpulsatile but tender wrist range of motion is normal wrist joint is stable strength and hand is normal skin is  intact no redness no rash radial pulses normal sensation to intact carpal tunnel tests are negative  Impression volar wrist ganglion  I called her doctor to get cardiology and medical clearance prior to having this procedure which can be done with a Bier block if needed  Diagnosis volar wrist ganglion  Plan return after cardiac and medical clearance

## 2012-06-16 ENCOUNTER — Encounter: Payer: Self-pay | Admitting: *Deleted

## 2012-06-18 DIAGNOSIS — F17200 Nicotine dependence, unspecified, uncomplicated: Secondary | ICD-10-CM

## 2012-06-18 DIAGNOSIS — E785 Hyperlipidemia, unspecified: Secondary | ICD-10-CM

## 2012-06-18 DIAGNOSIS — I714 Abdominal aortic aneurysm, without rupture: Secondary | ICD-10-CM

## 2012-06-18 DIAGNOSIS — I2510 Atherosclerotic heart disease of native coronary artery without angina pectoris: Secondary | ICD-10-CM

## 2012-06-18 DIAGNOSIS — N289 Disorder of kidney and ureter, unspecified: Secondary | ICD-10-CM

## 2012-06-18 DIAGNOSIS — K769 Liver disease, unspecified: Secondary | ICD-10-CM

## 2012-06-19 ENCOUNTER — Telehealth: Payer: Self-pay | Admitting: *Deleted

## 2012-06-19 NOTE — Telephone Encounter (Signed)
Faxed letter to Dr. Sherril Croon stating patient needed cardiac clearance before we can schedule surgery.

## 2012-07-04 ENCOUNTER — Telehealth: Payer: Self-pay | Admitting: Orthopedic Surgery

## 2012-07-04 NOTE — Telephone Encounter (Signed)
Patient had called to inquire if any response has been received to our faxed request for her to have clearance for surgery.  Please let patient know status.  Her ph# is [phone].

## 2012-07-05 NOTE — Telephone Encounter (Signed)
Spoke with patient, and told her we have not received anything from Dr. Sherril Croon regarding medical clearance. The patient stated she has an appointment with him on Friday and would talk with him then.

## 2012-07-11 NOTE — Telephone Encounter (Signed)
Letter received in fax today, 07/11/12, from patient's primary care physician, Dr. Sherril Croon regarding clearance.  It is noted that additional clearance will be needed from cardiologist [facility] in [place]). Patient also called today to relay that her primary care saw her today.  Aware that cardiac clearance is still pending.  Her home ph# is [phone].

## 2012-07-12 ENCOUNTER — Encounter: Payer: Self-pay | Admitting: *Deleted

## 2012-07-12 NOTE — Telephone Encounter (Signed)
Faxed cardiologist for cardiac clearance

## 2012-07-21 ENCOUNTER — Telehealth: Payer: Self-pay | Admitting: Cardiology

## 2012-07-21 NOTE — Telephone Encounter (Signed)
Called and informed patient via voicemail that paperwork is here and awaiting MD to review and respond when he's here in the office on Monday. Informed patient to call back on Monday for response.

## 2012-07-21 NOTE — Telephone Encounter (Signed)
Patient wanting to have surgery asap and said that she needs clearance to be able to have the surgery.  Told her that Dr needs to send [place] information about surgery so that she can have clearance. But she said she is getting the run around from all offices about who needs to do what.  [phone]

## 2012-07-24 ENCOUNTER — Telehealth: Payer: Self-pay | Admitting: *Deleted

## 2012-07-24 NOTE — Telephone Encounter (Signed)
Ok

## 2012-07-24 NOTE — Telephone Encounter (Signed)
Received fax from [facility], Dr. Antoine Poche, stating [name] can not have surgery. She can not stop plavix until she's taken it for one full year.

## 2012-07-25 NOTE — Telephone Encounter (Signed)
Pt aware.

## 2012-08-02 ENCOUNTER — Ambulatory Visit (INDEPENDENT_AMBULATORY_CARE_PROVIDER_SITE_OTHER): Payer: Medicaid Other | Admitting: Cardiology

## 2012-08-02 ENCOUNTER — Encounter: Payer: Self-pay | Admitting: Cardiology

## 2012-08-02 VITALS — BP 117/69 | HR 68 | Ht 64.0 in | Wt 154.4 lb

## 2012-08-02 DIAGNOSIS — I214 Non-ST elevation (NSTEMI) myocardial infarction: Secondary | ICD-10-CM

## 2012-08-02 DIAGNOSIS — F17200 Nicotine dependence, unspecified, uncomplicated: Secondary | ICD-10-CM

## 2012-08-02 DIAGNOSIS — I2510 Atherosclerotic heart disease of native coronary artery without angina pectoris: Secondary | ICD-10-CM

## 2012-08-02 MED ORDER — ISOSORBIDE MONONITRATE ER 60 MG PO TB24: 90.0000 mg | ORAL_TABLET | Freq: Every day | ORAL | Status: DC

## 2012-08-02 NOTE — Patient Instructions (Signed)
   Increase Imdur to 90mg  daily Continue all current medications. Follow up in  2 months

## 2012-08-02 NOTE — Progress Notes (Signed)
HPI The patient  Presents for followup of known CAD.  She is  Status post stenting of her LAD and circumflex again in March of this year.   She had had previous PCI in February. It is difficult to sort out her chest pain. However, after very careful questioning she says she's not getting any of the chest pressure that she was having. She says she's taken about 7 nitroglycerin but this is old or a sharp discomfort that is pretty brief and unlike previous angina.   She is very limited in her activities because of leg weakness. She says that her legs give out and she often falls. She does not describe claudication.   She has baseline shortness of breath but no PND or orthopnea. She is continuing to smoke a few cigarettes but says she's very much cutting down.   Allergies  Allergen Reactions  . Penicillins Swelling    Per patient report, she has facial and tongue swellling  . Percocet (Oxycodone-Acetaminophen) Other (See Comments)    Hallucinations  . Percodan (Oxycodone-Aspirin) Other (See Comments)    Hallucinations    Current Outpatient Prescriptions  Medication Sig Dispense Refill  . aspirin 81 MG tablet Take 81 mg by mouth daily.      . clopidogrel (PLAVIX) 75 MG tablet Take 1 tablet (75 mg total) by mouth daily.  30 tablet  11  . FLUoxetine (PROZAC) 20 MG capsule Take 20 mg by mouth daily.      . Fluticasone-Salmeterol (ADVAIR DISKUS) 100-50 MCG/DOSE AEPB Inhale 2 puffs into the lungs every 12 (twelve) hours.      Marland Kitchen imipramine (TOFRANIL) 10 MG tablet Take 10 mg by mouth 3 (three) times daily.      . isosorbide mononitrate (IMDUR) 60 MG 24 hr tablet TAKE 1 TABLET BY MOUTH EVERY DAY  30 tablet  3  . LORazepam (ATIVAN) 1 MG tablet Take 1 tablet (1 mg total) by mouth every 6 (six) hours as needed for anxiety.  30 tablet  0  . metoprolol tartrate (LOPRESSOR) 50 MG tablet Take 1 tablet (50 mg total) by mouth 2 (two) times daily.  60 tablet  6  . nitroGLYCERIN (NITROSTAT) 0.4 MG SL tablet Place  0.4 mg under the tongue every 5 (five) minutes as needed for chest pain. For chest pain      . pravastatin (PRAVACHOL) 80 MG tablet Take 1 tablet (80 mg total) by mouth daily.      . solifenacin (VESICARE) 10 MG tablet Take 0.5 tablets (5 mg total) by mouth daily.      . [DISCONTINUED] carvedilol (COREG) 3.125 MG tablet Take 1 tablet (3.125 mg total) by mouth 2 (two) times daily with a meal.  60 tablet  6  . [DISCONTINUED] citalopram (CELEXA) 20 MG tablet Take 1 tablet (20 mg total) by mouth daily.  30 tablet  1   No current facility-administered medications for this visit.    Past Medical History  Diagnosis Date  . Hypertension     a. Hypotension noted 05/2012.  Marland Kitchen Anxiety   . Depression   . Arthritis   . Irritable bowel syndrome (IBS)   . Diverticulitis   . Insomnia   . Eczema   . Gout   . GI bleed     Possible mild GI bleed 04/2011, hemoccult negative at [facility] and no colonoscopy/EGD felt necessary by GI at that time  . Ischemic cardiomyopathy     IMPROVED. a. EF 40-45% by echo at [facility] 04/2011. b. Improved to 55% by echo 05/2012.  Marland Kitchen CAD (coronary artery disease)     a. s/p two overlapping BMS to LAD 05/12/11, Occluded mid LCX with collaterals. b. NSTEMI 05/2012: PTCA/DES to mid Cx and PTCA/DES to prox-mid LAD 05/18/12.  Marland Kitchen Hyperlipidemia   . AAA (abdominal aortic aneurysm) 05/19/2012    a. Infrarenal AAA by CT at [facility] 3.4cm 05/2012.  . Tobacco abuse   . Hepatic lesion     a. Right hepatic lobe density of uncertain significance by CT at [facility] 05/2012 - needs f/u PCP.  Marland Kitchen Acute renal insufficiency     05/2012 - Cr 1.7. Improved.  . Tobacco use disorder 05/25/2012    Past Surgical History  Procedure Laterality Date  . Abdominal hysterectomy    . Cardiac catheterization    . Coronary angioplasty  05/2011    2 BMS to mid LAD    ROS:  As stated in the HPI and negative for all other systems.  PHYSICAL EXAM BP 117/69  Pulse 68  Ht 5\' 4"  (1.626 m)  Wt 154 lb 6.4 oz  (70.035 kg)  BMI 26.49 kg/m2  SpO2 98% GENERAL:  Well appearing HEENT:  Pupils equal round and reactive, fundi not visualized, oral mucosa unremarkable, dentures NECK:  No jugular venous distention, waveform within normal limits, carotid upstroke brisk and symmetric, no bruits, no thyromegaly LYMPHATICS:  No cervical, inguinal adenopathy LUNGS:  Clear to auscultation bilaterally BACK:  No CVA tenderness CHEST:  Unremarkable HEART:  PMI not displaced or sustained,S1 and S2 within normal limits, no S3, no S4, no clicks, no rubs, no murmurs ABD:  Flat, positive bowel sounds normal in frequency in pitch, no bruits, no rebound, no guarding, no midline pulsatile mass, no hepatomegaly, no splenomegaly EXT:  2 plus pulses throughout, no edema, no cyanosis no clubbing, large right ganglionic cyst SKIN:  No rashes no nodules NEURO:  Cranial nerves II through XII grossly intact, motor grossly intact throughout PSYCH:  Cognitively intact, oriented to person place and time   ASSESSMENT AND PLAN   CAD (coronary artery disease) -   She is not having the chest pain that was her pervious angina.  However, she is difficult to assess.  I am going to increase her Imdur to 90 mg.   If she has worsening chest pain she'll let me know and I would have a low threshold for repeat testing. She needs continued risk reduction.   CHF (congestive heart failure) - She seems to be euvolemic.  She will remain on the  meds as listed   Hyperlipidemia -  Continue treatment with pravastatin.     Leg weakness - I do not suspect a vascular etiology and we'll refer her back to VYAS,DHRUV B., MD as this is limiting her.   AAA - She is due for followup of this in September.  Tobacco - I continue to encourage complete abstinence.

## 2012-09-08 ENCOUNTER — Other Ambulatory Visit: Payer: Self-pay | Admitting: Cardiology

## 2012-09-08 MED ORDER — PRAVASTATIN SODIUM 80 MG PO TABS: 80.0000 mg | ORAL_TABLET | Freq: Every day | ORAL | Status: DC

## 2012-10-26 ENCOUNTER — Ambulatory Visit (INDEPENDENT_AMBULATORY_CARE_PROVIDER_SITE_OTHER): Payer: Medicaid Other | Admitting: Cardiology

## 2012-10-26 ENCOUNTER — Encounter: Payer: Self-pay | Admitting: Cardiology

## 2012-10-26 VITALS — BP 106/62 | HR 72 | Ht 64.0 in | Wt 156.0 lb

## 2012-10-26 DIAGNOSIS — R109 Unspecified abdominal pain: Secondary | ICD-10-CM

## 2012-10-26 DIAGNOSIS — I714 Abdominal aortic aneurysm, without rupture: Secondary | ICD-10-CM

## 2012-10-26 DIAGNOSIS — R112 Nausea with vomiting, unspecified: Secondary | ICD-10-CM

## 2012-10-26 DIAGNOSIS — I509 Heart failure, unspecified: Secondary | ICD-10-CM

## 2012-10-26 DIAGNOSIS — I2510 Atherosclerotic heart disease of native coronary artery without angina pectoris: Secondary | ICD-10-CM

## 2012-10-26 DIAGNOSIS — I214 Non-ST elevation (NSTEMI) myocardial infarction: Secondary | ICD-10-CM

## 2012-10-26 NOTE — Patient Instructions (Addendum)
Your physician recommends that you schedule a follow-up appointment in: 6 months. You will receive a reminder letter in the mail in about 4 months reminding you to call and schedule your appointment. If you don't receive this letter, please contact our office. Your physician recommends that you continue on your current medications as directed. Please refer to the Current Medication list given to you today. You are being referred to a Gastroenterologist.

## 2012-10-26 NOTE — Progress Notes (Signed)
HPI The patient  Presents for followup of known CAD.  She is  Status post stenting of her LAD and circumflex again in March of this year.   She had had previous PCI in February. At the last visit she was having some chest discomfort although this seemed atypical. I did increase her Imdur and she didn't have any change in her symptoms. She's having increasing chest discomfort that occurs every time she needs or swallows. She says consistently after drinking something she is throwing up. She is throwing up "water". She's not having any of her previous chest pressure. She's not having any new shortness of breath, PND or orthopnea. She has leg weakness and loss of balance. She smoking very few cigarettes now.   Allergies  Allergen Reactions  . Penicillins Swelling    Per patient report, she has facial and tongue swellling  . Percocet [Oxycodone-Acetaminophen] Other (See Comments)    Hallucinations  . Percodan [Oxycodone-Aspirin] Other (See Comments)    Hallucinations    Current Outpatient Prescriptions  Medication Sig Dispense Refill  . aspirin 81 MG tablet Take 81 mg by mouth daily.      . clopidogrel (PLAVIX) 75 MG tablet Take 1 tablet (75 mg total) by mouth daily.  30 tablet  11  . FLUoxetine (PROZAC) 20 MG capsule Take 20 mg by mouth daily.      . Fluticasone-Salmeterol (ADVAIR DISKUS) 100-50 MCG/DOSE AEPB Inhale 2 puffs into the lungs every 12 (twelve) hours.      Marland Kitchen imipramine (TOFRANIL) 10 MG tablet Take 10 mg by mouth 3 (three) times daily.      . isosorbide mononitrate (IMDUR) 60 MG 24 hr tablet Take 1.5 tablets (90 mg total) by mouth daily.  46 tablet  6  . LORazepam (ATIVAN) 1 MG tablet Take 1 tablet (1 mg total) by mouth every 6 (six) hours as needed for anxiety.  30 tablet  0  . metoprolol tartrate (LOPRESSOR) 50 MG tablet Take 1 tablet (50 mg total) by mouth 2 (two) times daily.  60 tablet  6  . nitroGLYCERIN (NITROSTAT) 0.4 MG SL tablet Place 0.4 mg under the tongue every 5  (five) minutes as needed for chest pain. For chest pain      . pravastatin (PRAVACHOL) 80 MG tablet Take 1 tablet (80 mg total) by mouth daily.  30 tablet  3  . solifenacin (VESICARE) 10 MG tablet Take 0.5 tablets (5 mg total) by mouth daily.      . [DISCONTINUED] carvedilol (COREG) 3.125 MG tablet Take 1 tablet (3.125 mg total) by mouth 2 (two) times daily with a meal.  60 tablet  6  . [DISCONTINUED] citalopram (CELEXA) 20 MG tablet Take 1 tablet (20 mg total) by mouth daily.  30 tablet  1   No current facility-administered medications for this visit.    Past Medical History  Diagnosis Date  . Hypertension     a. Hypotension noted 05/2012.  Marland Kitchen Anxiety   . Depression   . Arthritis   . Irritable bowel syndrome (IBS)   . Diverticulitis   . Insomnia   . Eczema   . Gout   . GI bleed     Possible mild GI bleed 04/2011, hemoccult negative at [facility] and no colonoscopy/EGD felt necessary by GI at that time  . Ischemic cardiomyopathy     IMPROVED. a. EF 40-45% by echo at [facility] 04/2011. b. Improved to 55% by echo 05/2012.  Marland Kitchen CAD (coronary artery disease)  a. s/p two overlapping BMS to LAD 05/12/11, Occluded mid LCX with collaterals. b. NSTEMI 05/2012: PTCA/DES to mid Cx and PTCA/DES to prox-mid LAD 05/18/12.  Marland Kitchen Hyperlipidemia   . AAA (abdominal aortic aneurysm) 05/19/2012    a. Infrarenal AAA by CT at [facility] 3.4cm 05/2012.  . Tobacco abuse   . Hepatic lesion     a. Right hepatic lobe density of uncertain significance by CT at [facility] 05/2012 - needs f/u PCP.  Marland Kitchen Acute renal insufficiency     05/2012 - Cr 1.7. Improved.  . Tobacco use disorder 05/25/2012    Past Surgical History  Procedure Laterality Date  . Abdominal hysterectomy    . Cardiac catheterization    . Coronary angioplasty  05/2011    2 BMS to mid LAD    ROS:  As stated in the HPI and negative for all other systems.  PHYSICAL EXAM BP 106/62  Pulse 72  Ht 5\' 4"  (1.626 m)  Wt 156 lb (70.761 kg)  BMI 26.76  kg/m2 GENERAL:  Well appearing HEENT:  Pupils equal round and reactive, fundi not visualized, oral mucosa unremarkable, dentures NECK:  No jugular venous distention, waveform within normal limits, carotid upstroke brisk and symmetric, no bruits, no thyromegaly LYMPHATICS:  No cervical, inguinal adenopathy LUNGS:  Clear to auscultation bilaterally BACK:  No CVA tenderness CHEST:  Unremarkable HEART:  PMI not displaced or sustained,S1 and S2 within normal limits, no S3, no S4, no clicks, no rubs, no murmurs ABD:  Flat, positive bowel sounds normal in frequency in pitch, no bruits, no rebound, no guarding, no midline pulsatile mass, no hepatomegaly, no splenomegaly EXT:  2 plus pulses throughout, no edema, no cyanosis no clubbing, large right ganglionic cyst SKIN:  No rashes no nodules NEURO:  Cranial nerves II through XII grossly intact, motor grossly intact throughout PSYCH:  Cognitively intact, oriented to person place and time, Flat affect   ASSESSMENT AND PLAN  N/V/Abdominal pain - This has become her predominant complaint and is different than her angina.  She needs to be seen by GI and I will arrange this.   CAD (coronary artery disease) -   She is not having the chest pain that was her previous angina.  She will remain on the meds as listed.  CHF (congestive heart failure) - She seems to be euvolemic.  She will remain on the  meds as listed  Hyperlipidemia -  Continue treatment with pravastatin.    AAA - She is schedule for followup of this next month  Tobacco - I continue to encourage complete abstinence.  I am proud of her progress to date.

## 2012-10-28 ENCOUNTER — Encounter: Payer: Self-pay | Admitting: Cardiology

## 2012-10-29 ENCOUNTER — Encounter: Payer: Self-pay | Admitting: Cardiology

## 2012-11-04 ENCOUNTER — Encounter: Payer: Self-pay | Admitting: Cardiology

## 2012-11-04 DIAGNOSIS — R5383 Other fatigue: Secondary | ICD-10-CM

## 2012-11-04 DIAGNOSIS — R5381 Other malaise: Secondary | ICD-10-CM

## 2012-11-05 ENCOUNTER — Encounter: Payer: Self-pay | Admitting: Cardiology

## 2012-11-22 ENCOUNTER — Encounter (INDEPENDENT_AMBULATORY_CARE_PROVIDER_SITE_OTHER): Payer: Medicaid Other

## 2012-11-22 DIAGNOSIS — I714 Abdominal aortic aneurysm, without rupture: Secondary | ICD-10-CM

## 2012-11-24 ENCOUNTER — Telehealth: Payer: Self-pay | Admitting: *Deleted

## 2012-11-24 NOTE — Telephone Encounter (Signed)
Patient informed. 

## 2012-11-24 NOTE — Telephone Encounter (Signed)
Message copied by Eustace Moore on Fri Nov 24, 2012 12:21 PM ------      Message from: Rollene Rotunda      Created: Thu Nov 23, 2012 11:45 AM       Follow up as suggested.  Call Ms. Prell with the results and send results to VYAS,DHRUV B., MD       ------

## 2012-12-26 ENCOUNTER — Telehealth: Payer: Self-pay | Admitting: Cardiology

## 2012-12-26 NOTE — Telephone Encounter (Signed)
Spoke with Dr. Starr Lake office today in regards to an appointment for patient Carla Hoover. They state that they have tried to contact patient. I gave them a new telephone # today. They are going to contact patient. i have also contacted Mrs. Tolleson in regards to this.

## 2012-12-29 ENCOUNTER — Telehealth: Payer: Self-pay | Admitting: Cardiology

## 2012-12-29 NOTE — Telephone Encounter (Signed)
Received a telephone call from Mrs. Fronek today. She has not received a telephone call from Dr. Starr Lake office. She ask if she could be referred to another doctor. I have called Dr. Darrick Penna office in [place] to see what the Turn around time will be for a new patient. I have left a message asking them to return my call. Mrs. Konczal Has been notified also.

## 2013-01-03 ENCOUNTER — Ambulatory Visit: Payer: Medicaid Other | Admitting: Gastroenterology

## 2013-01-14 ENCOUNTER — Encounter: Payer: Self-pay | Admitting: Cardiology

## 2013-01-18 ENCOUNTER — Encounter: Payer: Self-pay | Admitting: Gastroenterology

## 2013-01-18 ENCOUNTER — Ambulatory Visit (INDEPENDENT_AMBULATORY_CARE_PROVIDER_SITE_OTHER): Payer: Medicaid Other | Admitting: Gastroenterology

## 2013-01-18 ENCOUNTER — Encounter (INDEPENDENT_AMBULATORY_CARE_PROVIDER_SITE_OTHER): Payer: Self-pay

## 2013-01-18 ENCOUNTER — Ambulatory Visit: Payer: Medicaid Other | Admitting: Gastroenterology

## 2013-01-18 VITALS — BP 134/79 | HR 66 | Temp 97.9°F | Wt 157.8 lb

## 2013-01-18 DIAGNOSIS — R933 Abnormal findings on diagnostic imaging of other parts of digestive tract: Secondary | ICD-10-CM | POA: Insufficient documentation

## 2013-01-18 DIAGNOSIS — K769 Liver disease, unspecified: Secondary | ICD-10-CM

## 2013-01-18 DIAGNOSIS — R160 Hepatomegaly, not elsewhere classified: Secondary | ICD-10-CM

## 2013-01-18 DIAGNOSIS — K219 Gastro-esophageal reflux disease without esophagitis: Secondary | ICD-10-CM | POA: Insufficient documentation

## 2013-01-18 DIAGNOSIS — K5900 Constipation, unspecified: Secondary | ICD-10-CM

## 2013-01-18 HISTORY — DX: Gastro-esophageal reflux disease without esophagitis: K21.9

## 2013-01-18 MED ORDER — POLYETHYLENE GLYCOL 3350 17 GM/SCOOP PO POWD: ORAL | Status: DC

## 2013-01-18 MED ORDER — DEXLANSOPRAZOLE 60 MG PO CPDR: 60.0000 mg | DELAYED_RELEASE_CAPSULE | Freq: Every day | ORAL | Status: DC

## 2013-01-18 NOTE — Progress Notes (Signed)
Primary Care Physician:  Ignatius Specking., MD Referring MD: Dr. Antoine Poche Primary Gastroenterologist:  Patient requests Roetta Sessions, MD    Chief Complaint  Patient presents with  . abnormal CT SCAN    HPI:  Carla Hoover is a 62 y.o. female here at the request of Dr. Antoine Poche for further evaluation of nausea, vomiting, abdominal discomfort. Reported chest discomfort when she swallows. Abnormal CT.  Most recent coronary artery stenting in 05/3012. On Plavix and aspirin.  Patient complains of some nocturnal heartburn associated with vomiting. Day and night, heartburn. Eat about once per day because of symptoms. Feels like food/water stops in throat area. Urinary incontinence for five years. BM may go two weeks without BM, then diarrhea. No brbpr, melena. Stool softner occasionally. No weight loss. Tried TUMS, Zantac no help.   Current Outpatient Prescriptions  Medication Sig Dispense Refill  . aspirin 81 MG tablet Take 81 mg by mouth daily.      . clopidogrel (PLAVIX) 75 MG tablet Take 1 tablet (75 mg total) by mouth daily.  30 tablet  11  . FLUoxetine (PROZAC) 20 MG capsule Take 20 mg by mouth daily.      . Fluticasone-Salmeterol (ADVAIR DISKUS) 100-50 MCG/DOSE AEPB Inhale 2 puffs into the lungs every 12 (twelve) hours.      . furosemide (LASIX) 20 MG tablet Take 20 mg by mouth.      . isosorbide mononitrate (IMDUR) 60 MG 24 hr tablet Take 1.5 tablets (90 mg total) by mouth daily.  46 tablet  6  . LORazepam (ATIVAN) 1 MG tablet Take 1 tablet (1 mg total) by mouth every 6 (six) hours as needed for anxiety.  30 tablet  0  . metoprolol tartrate (LOPRESSOR) 50 MG tablet Take 1 tablet (50 mg total) by mouth 2 (two) times daily.  60 tablet  6  . nitroGLYCERIN (NITROSTAT) 0.4 MG SL tablet Place 0.4 mg under the tongue every 5 (five) minutes as needed for chest pain. For chest pain      . pravastatin (PRAVACHOL) 80 MG tablet Take 1 tablet (80 mg total) by mouth daily.  30 tablet  3  .  [DISCONTINUED] carvedilol (COREG) 3.125 MG tablet Take 1 tablet (3.125 mg total) by mouth 2 (two) times daily with a meal.  60 tablet  6  . [DISCONTINUED] citalopram (CELEXA) 20 MG tablet Take 1 tablet (20 mg total) by mouth daily.  30 tablet  1   No current facility-administered medications for this visit.    Allergies as of 01/18/2013 - Review Complete 01/18/2013  Allergen Reaction Noted  . Penicillins Swelling 05/12/2011  . Percocet [oxycodone-acetaminophen] Other (See Comments) 05/12/2011  . Percodan [oxycodone-aspirin] Other (See Comments) 05/12/2011    Past Medical History  Diagnosis Date  . Hypertension     a. Hypotension noted 05/2012.  Marland Kitchen Anxiety   . Depression   . Arthritis   . Irritable bowel syndrome (IBS)   . Diverticulitis   . Insomnia   . Eczema   . Gout   . GI bleed     Possible mild GI bleed 04/2011, hemoccult negative at [facility] and no colonoscopy/EGD felt necessary by GI at that time  . Ischemic cardiomyopathy     IMPROVED. a. EF 40-45% by echo at [facility] 04/2011. b. Improved to 55% by echo 05/2012.  Marland Kitchen CAD (coronary artery disease)     a. s/p two overlapping BMS to LAD 05/12/11, Occluded mid LCX with collaterals. b. NSTEMI 05/2012: PTCA/DES to mid  Cx and PTCA/DES to prox-mid LAD 05/18/12.  Marland Kitchen Hyperlipidemia   . AAA (abdominal aortic aneurysm) 05/19/2012    a. Infrarenal AAA by CT at [facility] 3.4cm 05/2012.  . Tobacco abuse   . Hepatic lesion     a. Right hepatic lobe density of uncertain significance by CT at [facility] 05/2012 - needs f/u PCP.  Marland Kitchen Acute renal insufficiency     05/2012 - Cr 1.7. Improved.  . Tobacco use disorder 05/25/2012  . GERD (gastroesophageal reflux disease) 01/18/2013    Past Surgical History  Procedure Laterality Date  . Abdominal hysterectomy    . Cardiac catheterization    . Coronary angioplasty  05/2011    2 BMS to mid LAD  . Colonoscopy      about 10 years, Dr. Juluis Mire    Family History  Problem Relation Age of Onset  .  Colon cancer Neg Hx   . Lung cancer Brother     recurrent X 4    History   Social History  . Marital Status: Divorced    Spouse Name: N/A    Number of Children: 1  . Years of Education: N/A   Occupational History  . DISABLED    Social History Main Topics  . Smoking status: Current Some Day Smoker -- 50 years    Types: Cigarettes  . Smokeless tobacco: Never Used     Comment: smoked 4 ciggs since February last year  . Alcohol Use: 1.2 oz/week    2 Glasses of wine per week     Comment: on occasion  . Drug Use: No  . Sexual Activity: Not on file   Other Topics Concern  . Not on file   Social History Narrative  . No narrative on file      ROS:  General: Negative for anorexia, weight loss, fever, chills, fatigue, weakness. Eyes: Negative for vision changes.  ENT: Negative for hoarseness,  nasal congestion. CV: Negative for chest pain, angina, palpitations, dyspnea on exertion, peripheral edema.  Respiratory: Negative for dyspnea at rest, dyspnea on exertion, cough, sputum, wheezing.  GI: See history of present illness. GU:  Negative for dysuria, hematuria,  urinary frequency, nocturnal urination. + urinary incontinence MS: Negative for joint pain, low back pain.  Derm: Negative for rash or itching.  Neuro: Negative for weakness, abnormal sensation, seizure, frequent headaches, memory loss, confusion.  Psych: Negative for anxiety, depression, suicidal ideation, hallucinations.  Endo: Negative for unusual weight change.  Heme: Negative for bruising or bleeding. Allergy: Negative for rash or hives.    Physical Examination:  BP 134/79  Pulse 66  Temp(Src) 97.9 F (36.6 C) (Oral)  Wt 157 lb 12.8 oz (71.578 kg)   General: Well-nourished, well-developed in no acute distress.  Head: Normocephalic, atraumatic.   Eyes: Conjunctiva pink, no icterus. Mouth: Oropharyngeal mucosa moist and pink , no lesions erythema or exudate. Neck: Supple without thyromegaly, masses, or  lymphadenopathy.  Lungs: Clear to auscultation bilaterally.  Heart: Regular rate and rhythm, no murmurs rubs or gallops.  Abdomen: Bowel sounds are normal, nontender, nondistended, no hepatosplenomegaly or masses, no abdominal bruits or    hernia , no rebound or guarding.   Rectal: not performed Extremities: No lower extremity edema. No clubbing or deformities.  Neuro: Alert and oriented x 4 , grossly normal neurologically.  Skin: Warm and dry, no rash or jaundice.   Psych: Alert and cooperative, normal mood and affect.  Labs: August 2014. Total bilirubin 0.3, alkaline phosphatase 59, AST 13, ALT  18, albumin 3.7, hemoglobin 11.5, hematocrit 33.5, MCV 106, platelets 202,000.  Imaging Studies: CT 10/28/2012 without contrast. Severe diverticulosis of the sigmoid colon. Mild sigmoid wall thickening similar to prior study in March 2014. Questionable mild stranding at the lateral coronal fascia/left colon, difficult to exclude mild diverticulitis. 20 mm stable hypodense area in the dome of the liver, more lobulated contour than before new abnormal right lung base opacity most consistent with multifocal lobar bronchopneumonia. Thoracoabdominal aortic ectasia. Infrarenal abdominal aortic aneurysm stable at 39 mm.  MRI abdomen without contrast 06/05/2012. 1.9 cm hepatic done lesion, nonspecific, prominent left liver lobe question subtle sclerosis, common bile duct 9 mm.

## 2013-01-18 NOTE — Patient Instructions (Signed)
1. Start Dexilant one daily before breakfast for heartburn. You may take first dose now. 2. Start Miralax one capful twice daily for three days and then once daily as needed for constipation. You may take every day if needed. 3. We will call you with further instructions once I have reviewed all of your imaging studies and spoken with Dr. Jena Gauss.

## 2013-01-19 ENCOUNTER — Telehealth: Payer: Self-pay

## 2013-01-19 ENCOUNTER — Encounter: Payer: Self-pay | Admitting: Gastroenterology

## 2013-01-19 MED ORDER — PANTOPRAZOLE SODIUM 40 MG PO TBEC: 40.0000 mg | DELAYED_RELEASE_TABLET | Freq: Every day | ORAL | Status: AC

## 2013-01-19 NOTE — Telephone Encounter (Signed)
Received fax from pharmacy-pt has [place] medicaid and they will not pay for dexilant until pt has tried and fail omeprazole and pantoprazole. Only meds tried are tums and zantac per chart.

## 2013-01-19 NOTE — Telephone Encounter (Signed)
She can complete Dexilant samples we gave her then start new RX. I will send in pantoprazole.

## 2013-01-19 NOTE — Assessment & Plan Note (Signed)
Start miralax 17 g BID for three days and then daily. Increase dietary fiber and water intake.

## 2013-01-19 NOTE — Assessment & Plan Note (Addendum)
Poorly controlled heartburn, nocturnal especially. Associated with vomiting and chest discomfort. Swallowing difficulties even with water at times. Suspect erosive esophagitis +/- esophageal stricture. She has not been on any PPI. She is on Plavix, most recent stenting in 05/2012. EGD with dilation can be performed on Plavix. Does increase risk of bleeding some but usually manageable. To discuss with Dr. Jena Gauss.   Start Dexilant. Samples provided and RX provided.

## 2013-01-19 NOTE — Telephone Encounter (Signed)
Tried to call pt- LMOM with instructions.  

## 2013-01-19 NOTE — Assessment & Plan Note (Signed)
Small liver lesion in dome of liver, and ?subtle cirrhosis. Stable from MRI 05/2012 and CT 10/2012 both noncontrast studies. Will discuss further with Dr. Jena Gauss. Likely just needs continued surveillance but will discuss if sampling is recommended.

## 2013-01-19 NOTE — Assessment & Plan Note (Signed)
Consider updated colonoscopy. Likely changes from chronic severe diverticulosis. Last TCS over 10 years ago. Review findings with Dr. Jena Gauss. Consider colonoscopy at time of EGD.

## 2013-01-22 NOTE — Progress Notes (Signed)
cc'd to pcp 

## 2013-01-26 ENCOUNTER — Telehealth: Payer: Self-pay | Admitting: Gastroenterology

## 2013-01-26 DIAGNOSIS — Z139 Encounter for screening, unspecified: Secondary | ICD-10-CM

## 2013-01-26 NOTE — Telephone Encounter (Signed)
Please let patient know I discussed work-up with Dr. Jena Gauss.  1. Schedule EGD/ED/TCS with Dr. Jena Gauss for GERD, dysphagia, abnormal colon on CT. Continue Plavix/ASA.  2. Regarding liver lesion. He recommends trying to get repeat MRI liver with contrast. Prior CT/MRI was done without contrast. She has mild bump in her creatinine with cardiac cath earlier this year.  If she is okay with this, let's start with getting Met-7. If creatinine clearance is okay, then I will discuss with radiologist before we order MRI.

## 2013-01-29 NOTE — Telephone Encounter (Signed)
Tried to call pt- LMOM 

## 2013-01-31 ENCOUNTER — Other Ambulatory Visit: Payer: Self-pay

## 2013-01-31 ENCOUNTER — Other Ambulatory Visit: Payer: Self-pay | Admitting: Internal Medicine

## 2013-01-31 DIAGNOSIS — Z139 Encounter for screening, unspecified: Secondary | ICD-10-CM

## 2013-01-31 DIAGNOSIS — R1319 Other dysphagia: Secondary | ICD-10-CM

## 2013-01-31 DIAGNOSIS — K219 Gastro-esophageal reflux disease without esophagitis: Secondary | ICD-10-CM

## 2013-01-31 DIAGNOSIS — R933 Abnormal findings on diagnostic imaging of other parts of digestive tract: Secondary | ICD-10-CM

## 2013-01-31 MED ORDER — PEG 3350-KCL-NA BICARB-NACL 420 G PO SOLR: 4000.0000 mL | ORAL | Status: DC

## 2013-01-31 NOTE — Telephone Encounter (Signed)
I have Carla Hoover scheduled for TCS/EGD/ED with RMR on 12/4 I have mailed her the instructions

## 2013-01-31 NOTE — Telephone Encounter (Signed)
Pt is aware. Lab order faxed to lab. She will go by as soon as she can to have blood work done. She is aware of date and time of procedures. Informed her that instructions have been mailed to her and if she doesn't receive them she needs to call us. She is aware that if creatinine ok and after LSL speaks with radiologist, she will be scheduled for MRI.

## 2013-02-02 ENCOUNTER — Encounter (HOSPITAL_COMMUNITY): Payer: Self-pay | Admitting: Pharmacy Technician

## 2013-02-05 LAB — BASIC METABOLIC PANEL
BUN: 17 mg/dL (ref 6–23)
CO2: 25 mEq/L (ref 19–32)
Calcium: 9.1 mg/dL (ref 8.4–10.5)
Creat: 1.11 mg/dL — ABNORMAL HIGH (ref 0.50–1.10)
Sodium: 136 mEq/L (ref 135–145)

## 2013-02-15 ENCOUNTER — Ambulatory Visit (HOSPITAL_COMMUNITY)
Admission: RE | Admit: 2013-02-15 | Discharge: 2013-02-15 | Disposition: A | Discharge status: Home / Self Care | Payer: Medicaid Other | Source: Ambulatory Visit | Attending: Internal Medicine | Admitting: Internal Medicine

## 2013-02-15 ENCOUNTER — Encounter (HOSPITAL_COMMUNITY): Payer: Self-pay | Admitting: *Deleted

## 2013-02-15 ENCOUNTER — Encounter (HOSPITAL_COMMUNITY)
Admission: RE | Disposition: A | Discharge status: Home / Self Care | Payer: Self-pay | Source: Ambulatory Visit | Attending: Internal Medicine

## 2013-02-15 DIAGNOSIS — Z1211 Encounter for screening for malignant neoplasm of colon: Secondary | ICD-10-CM

## 2013-02-15 DIAGNOSIS — R1319 Other dysphagia: Secondary | ICD-10-CM

## 2013-02-15 DIAGNOSIS — K2100 Gastro-esophageal reflux disease with esophagitis, without bleeding: Secondary | ICD-10-CM | POA: Insufficient documentation

## 2013-02-15 DIAGNOSIS — K5730 Diverticulosis of large intestine without perforation or abscess without bleeding: Secondary | ICD-10-CM

## 2013-02-15 DIAGNOSIS — K449 Diaphragmatic hernia without obstruction or gangrene: Secondary | ICD-10-CM

## 2013-02-15 DIAGNOSIS — R112 Nausea with vomiting, unspecified: Secondary | ICD-10-CM

## 2013-02-15 DIAGNOSIS — D126 Benign neoplasm of colon, unspecified: Secondary | ICD-10-CM

## 2013-02-15 DIAGNOSIS — R110 Nausea: Secondary | ICD-10-CM | POA: Insufficient documentation

## 2013-02-15 DIAGNOSIS — R933 Abnormal findings on diagnostic imaging of other parts of digestive tract: Secondary | ICD-10-CM

## 2013-02-15 DIAGNOSIS — R1310 Dysphagia, unspecified: Secondary | ICD-10-CM | POA: Insufficient documentation

## 2013-02-15 DIAGNOSIS — K219 Gastro-esophageal reflux disease without esophagitis: Secondary | ICD-10-CM

## 2013-02-15 DIAGNOSIS — Z7982 Long term (current) use of aspirin: Secondary | ICD-10-CM | POA: Insufficient documentation

## 2013-02-15 HISTORY — PX: ESOPHAGOGASTRODUODENOSCOPY (EGD) WITH ESOPHAGEAL DILATION: SHX5812

## 2013-02-15 HISTORY — PX: COLONOSCOPY: SHX5424

## 2013-02-15 SURGERY — COLONOSCOPY
Anesthesia: Moderate Sedation

## 2013-02-15 MED ORDER — ONDANSETRON HCL 4 MG/2ML IJ SOLN
INTRAMUSCULAR | Status: AC
  Filled 2013-02-15: qty 2

## 2013-02-15 MED ORDER — ONDANSETRON HCL 4 MG/2ML IJ SOLN
INTRAMUSCULAR | Status: DC | PRN
  Administered 2013-02-15: 4 mg via INTRAVENOUS

## 2013-02-15 MED ORDER — STERILE WATER FOR IRRIGATION IR SOLN
Status: DC | PRN
  Administered 2013-02-15: 14:00:00

## 2013-02-15 MED ORDER — SODIUM CHLORIDE 0.9 % IV SOLN
INTRAVENOUS | Status: DC
  Administered 2013-02-15: 14:00:00 via INTRAVENOUS

## 2013-02-15 MED ORDER — MEPERIDINE HCL 100 MG/ML IJ SOLN
INTRAMUSCULAR | Status: DC | PRN
  Administered 2013-02-15 (×2): 50 mg via INTRAVENOUS
  Administered 2013-02-15 (×2): 25 mg via INTRAVENOUS

## 2013-02-15 MED ORDER — BUTAMBEN-TETRACAINE-BENZOCAINE 2-2-14 % EX AERO
INHALATION_SPRAY | CUTANEOUS | Status: DC | PRN
  Administered 2013-02-15: 2 via TOPICAL

## 2013-02-15 MED ORDER — MIDAZOLAM HCL 5 MG/5ML IJ SOLN
INTRAMUSCULAR | Status: DC | PRN
  Administered 2013-02-15: 1 mg via INTRAVENOUS
  Administered 2013-02-15: 2 mg via INTRAVENOUS
  Administered 2013-02-15 (×2): 1 mg via INTRAVENOUS
  Administered 2013-02-15: 2 mg via INTRAVENOUS
  Administered 2013-02-15: 1 mg via INTRAVENOUS

## 2013-02-15 MED ORDER — MEPERIDINE HCL 100 MG/ML IJ SOLN
INTRAMUSCULAR | Status: AC
  Filled 2013-02-15: qty 2

## 2013-02-15 MED ORDER — MIDAZOLAM HCL 5 MG/5ML IJ SOLN
INTRAMUSCULAR | Status: AC
  Filled 2013-02-15: qty 10

## 2013-02-15 NOTE — Op Note (Signed)
[facility] [address] [place], [address]   ENDOSCOPY PROCEDURE REPORT  PATIENT: Carla Hoover, Uber  MR#: [id number] BIRTHDATE: May 14, 1950 , 62  yrs. old GENDER: Female ENDOSCOPIST: R.  Roetta Sessions, MD FACP FACG REFERRED BY:  Doreen Beam, M.D.  Rollene Rotunda, M.D. PROCEDURE DATE:  02/15/2013 PROCEDURE:     EGD with Elease Hashimoto dilation followed by esophageal and gastric biopsy  INDICATIONS:    Nausea and vomiting; refractory GERD; esophageal dysphagia - nausea, vomiting and reflux much improved with a recent course of Dexilant.  INFORMED CONSENT:   The risks, benefits, limitations, alternatives and imponderables have been discussed.  The potential for biopsy, esophogeal dilation, etc. have also been reviewed.  Questions have been answered.  All parties agreeable.  Please see the history and physical in the medical record for more information.  MEDICATIONS:  Versed 5 mg IV and Demerol 125 mg IV in divided doses. Zofran 4 mg IV. Cetacaine spray  DESCRIPTION OF PROCEDURE:   The [id number]-[id number] ([id number])  endoscope was introduced through the mouth and advanced to the second portion of the duodenum without difficulty or limitations.  The mucosal surfaces were surveyed very carefully during advancement of the scope and upon withdrawal.  Retroflexion view of the proximal stomach and esophagogastric junction was performed.      FINDINGS: 1-2 mm superficial circumferential distal esophageal erosions. Patient also had a couple of "islands" of salmon-colored epithelium distally. No tumor. Esophagus appeared patent throughout its course. Stomach empty. Small hiatal hernia. some "snakes skinning" and submucosal petechiae of the mid and proximal gastric body. No ulcer or infiltrating process. Patent pylorus. Normal first and second portion of the duodenum.  THERAPEUTIC / DIAGNOSTIC MANEUVERS PERFORMED:   A 54 French Maloney dilator was  passed to full insertion with ease.  A look back revealed a superficial tear through the upper esophageal sphincter mucosa; otherwise, no apparent complication related to this maneuver. Subsequently, biopsies of the abnormal distal esophagus and gastric mucosa were taken.   COMPLICATIONS:  None  IMPRESSION:  Erosive reflux esophagitis. query small area of Barrett's esophagus  -status post Maloney dilation, biopsy. Hiatal hernia. Abnormal gastric mucosa  -  status post gastric biopsy  RECOMMENDATIONS:  Followup on pathology. Resume Dexilant 60 mg daily. See colonoscopy report. Follow through on recommendations for update MRI to reassess previously noted hepatic lesion.    _______________________________ R. Roetta Sessions, MD FACP [facility] eSigned:  R. Roetta Sessions, MD FACP [facility] 02/15/2013 2:49 PM     CC:  PATIENT NAME:  Tylan, Briguglio MR#: [id number]

## 2013-02-15 NOTE — H&P (View-Only) (Signed)
Primary Care Physician:  VYAS,DHRUV B., MD Referring MD: Dr. Hochrein Primary Gastroenterologist:  Patient requests Michael Rourk, MD    Chief Complaint  Patient presents with  . abnormal CT SCAN    HPI:  Carla Hoover is a 62 y.o. female here at the request of Dr. Hochrein for further evaluation of nausea, vomiting, abdominal discomfort. Reported chest discomfort when she swallows. Abnormal CT.  Most recent coronary artery stenting in 05/3012. On Plavix and aspirin.  Patient complains of some nocturnal heartburn associated with vomiting. Day and night, heartburn. Eat about once per day because of symptoms. Feels like food/water stops in throat area. Urinary incontinence for five years. BM may go two weeks without BM, then diarrhea. No brbpr, melena. Stool softner occasionally. No weight loss. Tried TUMS, Zantac no help.   Current Outpatient Prescriptions  Medication Sig Dispense Refill  . aspirin 81 MG tablet Take 81 mg by mouth daily.      . clopidogrel (PLAVIX) 75 MG tablet Take 1 tablet (75 mg total) by mouth daily.  30 tablet  11  . FLUoxetine (PROZAC) 20 MG capsule Take 20 mg by mouth daily.      . Fluticasone-Salmeterol (ADVAIR DISKUS) 100-50 MCG/DOSE AEPB Inhale 2 puffs into the lungs every 12 (twelve) hours.      . furosemide (LASIX) 20 MG tablet Take 20 mg by mouth.      . isosorbide mononitrate (IMDUR) 60 MG 24 hr tablet Take 1.5 tablets (90 mg total) by mouth daily.  46 tablet  6  . LORazepam (ATIVAN) 1 MG tablet Take 1 tablet (1 mg total) by mouth every 6 (six) hours as needed for anxiety.  30 tablet  0  . metoprolol tartrate (LOPRESSOR) 50 MG tablet Take 1 tablet (50 mg total) by mouth 2 (two) times daily.  60 tablet  6  . nitroGLYCERIN (NITROSTAT) 0.4 MG SL tablet Place 0.4 mg under the tongue every 5 (five) minutes as needed for chest pain. For chest pain      . pravastatin (PRAVACHOL) 80 MG tablet Take 1 tablet (80 mg total) by mouth daily.  30 tablet  3  .  [DISCONTINUED] carvedilol (COREG) 3.125 MG tablet Take 1 tablet (3.125 mg total) by mouth 2 (two) times daily with a meal.  60 tablet  6  . [DISCONTINUED] citalopram (CELEXA) 20 MG tablet Take 1 tablet (20 mg total) by mouth daily.  30 tablet  1   No current facility-administered medications for this visit.    Allergies as of 01/18/2013 - Review Complete 01/18/2013  Allergen Reaction Noted  . Penicillins Swelling 05/12/2011  . Percocet [oxycodone-acetaminophen] Other (See Comments) 05/12/2011  . Percodan [oxycodone-aspirin] Other (See Comments) 05/12/2011    Past Medical History  Diagnosis Date  . Hypertension     a. Hypotension noted 05/2012.  . Anxiety   . Depression   . Arthritis   . Irritable bowel syndrome (IBS)   . Diverticulitis   . Insomnia   . Eczema   . Gout   . GI bleed     Possible mild GI bleed 04/2011, hemoccult negative at [facility] and no colonoscopy/EGD felt necessary by GI at that time  . Ischemic cardiomyopathy     IMPROVED. a. EF 40-45% by echo at [facility] 04/2011. b. Improved to 55% by echo 05/2012.  . CAD (coronary artery disease)     a. s/p two overlapping BMS to LAD 05/12/11, Occluded mid LCX with collaterals. b. NSTEMI 05/2012: PTCA/DES to mid   Cx and PTCA/DES to prox-mid LAD 05/18/12.  . Hyperlipidemia   . AAA (abdominal aortic aneurysm) 05/19/2012    a. Infrarenal AAA by CT at [facility] 3.4cm 05/2012.  . Tobacco abuse   . Hepatic lesion     a. Right hepatic lobe density of uncertain significance by CT at [facility] 05/2012 - needs f/u PCP.  . Acute renal insufficiency     05/2012 - Cr 1.7. Improved.  . Tobacco use disorder 05/25/2012  . GERD (gastroesophageal reflux disease) 01/18/2013    Past Surgical History  Procedure Laterality Date  . Abdominal hysterectomy    . Cardiac catheterization    . Coronary angioplasty  05/2011    2 BMS to mid LAD  . Colonoscopy      about 10 years, Dr. Fleichman    Family History  Problem Relation Age of Onset  .  Colon cancer Neg Hx   . Lung cancer Brother     recurrent X 4    History   Social History  . Marital Status: Divorced    Spouse Name: N/A    Number of Children: 1  . Years of Education: N/A   Occupational History  . DISABLED    Social History Main Topics  . Smoking status: Current Some Day Smoker -- 50 years    Types: Cigarettes  . Smokeless tobacco: Never Used     Comment: smoked 4 ciggs since February last year  . Alcohol Use: 1.2 oz/week    2 Glasses of wine per week     Comment: on occasion  . Drug Use: No  . Sexual Activity: Not on file   Other Topics Concern  . Not on file   Social History Narrative  . No narrative on file      ROS:  General: Negative for anorexia, weight loss, fever, chills, fatigue, weakness. Eyes: Negative for vision changes.  ENT: Negative for hoarseness,  nasal congestion. CV: Negative for chest pain, angina, palpitations, dyspnea on exertion, peripheral edema.  Respiratory: Negative for dyspnea at rest, dyspnea on exertion, cough, sputum, wheezing.  GI: See history of present illness. GU:  Negative for dysuria, hematuria,  urinary frequency, nocturnal urination. + urinary incontinence MS: Negative for joint pain, low back pain.  Derm: Negative for rash or itching.  Neuro: Negative for weakness, abnormal sensation, seizure, frequent headaches, memory loss, confusion.  Psych: Negative for anxiety, depression, suicidal ideation, hallucinations.  Endo: Negative for unusual weight change.  Heme: Negative for bruising or bleeding. Allergy: Negative for rash or hives.    Physical Examination:  BP 134/79  Pulse 66  Temp(Src) 97.9 F (36.6 C) (Oral)  Wt 157 lb 12.8 oz (71.578 kg)   General: Well-nourished, well-developed in no acute distress.  Head: Normocephalic, atraumatic.   Eyes: Conjunctiva pink, no icterus. Mouth: Oropharyngeal mucosa moist and pink , no lesions erythema or exudate. Neck: Supple without thyromegaly, masses, or  lymphadenopathy.  Lungs: Clear to auscultation bilaterally.  Heart: Regular rate and rhythm, no murmurs rubs or gallops.  Abdomen: Bowel sounds are normal, nontender, nondistended, no hepatosplenomegaly or masses, no abdominal bruits or    hernia , no rebound or guarding.   Rectal: not performed Extremities: No lower extremity edema. No clubbing or deformities.  Neuro: Alert and oriented x 4 , grossly normal neurologically.  Skin: Warm and dry, no rash or jaundice.   Psych: Alert and cooperative, normal mood and affect.  Labs: August 2014. Total bilirubin 0.3, alkaline phosphatase 59, AST 13, ALT   18, albumin 3.7, hemoglobin 11.5, hematocrit 33.5, MCV 106, platelets 202,000.  Imaging Studies: CT 10/28/2012 without contrast. Severe diverticulosis of the sigmoid colon. Mild sigmoid wall thickening similar to prior study in March 2014. Questionable mild stranding at the lateral coronal fascia/left colon, difficult to exclude mild diverticulitis. 20 mm stable hypodense area in the dome of the liver, more lobulated contour than before new abnormal right lung base opacity most consistent with multifocal lobar bronchopneumonia. Thoracoabdominal aortic ectasia. Infrarenal abdominal aortic aneurysm stable at 39 mm.  MRI abdomen without contrast 06/05/2012. 1.9 cm hepatic done lesion, nonspecific, prominent left liver lobe question subtle sclerosis, common bile duct 9 mm.    

## 2013-02-15 NOTE — Interval H&P Note (Signed)
History and Physical Interval Note:  02/15/2013 2:14 PM  Carla Hoover  has presented today for surgery, with the diagnosis of ABNORMAL COLON ON CT, GERD AND DYSPHAGIA  The various methods of treatment have been discussed with the patient and family. After consideration of risks, benefits and other options for treatment, the patient has consented to  Procedure(s) with comments: COLONOSCOPY (N/A) - 2:15-moved to [address] [place] notified pt ESOPHAGOGASTRODUODENOSCOPY (EGD) WITH ESOPHAGEAL DILATION (N/A) as a surgical intervention .  The patient's history has been reviewed, patient examined, no change in status, stable for surgery.  I have reviewed the patient's chart and labs.  Questions were answered to the patient's satisfaction.     No change. EGD with possible esophageal dilation and colonoscopy per plan.The risks, benefits, limitations, imponderables and alternatives regarding both EGD and colonoscopy have been reviewed with the patient. Questions have been answered. All parties agreeable.    [name]

## 2013-02-15 NOTE — Op Note (Signed)
[facility] [address] [place], [address]   COLONOSCOPY PROCEDURE REPORT  PATIENT: Carla Hoover, Carla Hoover  MR#:         [id number] BIRTHDATE: [birth year] , 62  yrs. old GENDER: Female ENDOSCOPIST: R.  Roetta Sessions, MD FACP FACG REFERRED BY:  Doreen Beam, M.D.  Rollene Rotunda, M.D. PROCEDURE DATE:  02/15/2013 PROCEDURE:     Colonoscopy with multiple snare polypectomies, polyp ablation  INDICATIONS: Colorectal cancer screening examination  INFORMED CONSENT:  The risks, benefits, alternatives and imponderables including but not limited to bleeding, perforation as well as the possibility of a missed lesion have been reviewed.  The potential for biopsy, lesion removal, etc. have also been discussed.  Questions have been answered.  All parties agreeable. Please see the history and physical in the medical record for more information.  MEDICATIONS: Versed 8 mg IV and Demerol 150 mg IV in divided doses. Zofran 4 mg IV.  DESCRIPTION OF PROCEDURE:  After a digital rectal exam was performed, the EG-[id number] ([id number]) and EC-[id number] ([id number]) colonoscope was advanced from the anus through the rectum and colon to the area of the cecum, ileocecal valve and appendiceal orifice. The cecum was deeply intubated.  These structures were well-seen and photographed for the record.  From the level of the cecum and ileocecal valve, the scope was slowly and cautiously withdrawn. The mucosal surfaces were carefully surveyed utilizing scope tip deflection to facilitate fold flattening as needed.  The scope was pulled down into the rectum where a thorough examination including retroflexion was performed.    FINDINGS:  Adequate preparation. Anal papilla and internal hemorrhoids; otherwise normal rectum. Densely populated sigmoid diverticula. The patient had multiple colonic polyps in the area of the cecum, hepatic flexure and descending segment. The largest polyp was in the base of the  cecum approximately 8-9 mm in dimensions. The remainder of colonic mucosa appeared normal.  THERAPEUTIC / DIAGNOSTIC MANEUVERS PERFORMED:  Multiple hot and cold snare polypectomies performed. Diminutive polyps at the hepatic flexure were  ablated with the tip of the hot snare loop.  COMPLICATIONS: None  CECAL WITHDRAWAL TIME:  18 minutes  IMPRESSION:  Colonic diverticulosis. Multiple colonic polyps removed and or ablated as described above  RECOMMENDATIONS: Followup on pathology report. See EGD report   _______________________________ eSigned:  R. Roetta Sessions, MD FACP [facility] 02/15/2013 3:30 PM   CC:    PATIENT NAME:  Nil, Bolser MR#: [id number]

## 2013-02-19 ENCOUNTER — Encounter: Payer: Self-pay | Admitting: Internal Medicine

## 2013-02-21 ENCOUNTER — Other Ambulatory Visit: Payer: Self-pay | Admitting: Gastroenterology

## 2013-02-21 ENCOUNTER — Encounter (HOSPITAL_COMMUNITY): Payer: Self-pay | Admitting: Internal Medicine

## 2013-02-21 DIAGNOSIS — K769 Liver disease, unspecified: Secondary | ICD-10-CM

## 2013-02-21 NOTE — Telephone Encounter (Signed)
Carla Hoover, please check on patient. Throat soreness related to procedure should be better at this point. ?URI infection. At the most we could recommend is chloraseptic spray. If ongoing throat pain, coughing, then go to PCP.  Please let patient know, Benedetto Goad will be calling to set of MRI abd to f/u on liver lesion. Sent her a note today.

## 2013-02-21 NOTE — Telephone Encounter (Signed)
Pt called stating she had her procedure done last Thursday and last night she woke up sick, and yesterday morning she was coughing up yellow stuff, pt states her throat is sore, pt states yesterday was a bad day today she feels 90% better she would like something for her throat being sore. Please advise [phone]

## 2013-02-21 NOTE — Telephone Encounter (Signed)
Pt is aware. She is also having some nausea and is requesting rx for nausea med sent to [facility].  Pt is aware of MRI date and time and to be NPO after midnight.

## 2013-02-22 MED ORDER — ONDANSETRON HCL 4 MG PO TABS: 4.0000 mg | ORAL_TABLET | Freq: Three times a day (TID) | ORAL | Status: DC | PRN

## 2013-02-22 NOTE — Addendum Note (Signed)
Addended by: Tiffany Kocher on: 02/22/2013 02:08 PM   Modules accepted: Orders

## 2013-02-22 NOTE — Telephone Encounter (Signed)
RX for Zofran sent

## 2013-02-23 ENCOUNTER — Encounter (HOSPITAL_COMMUNITY): Payer: Self-pay

## 2013-02-23 ENCOUNTER — Ambulatory Visit (HOSPITAL_COMMUNITY)
Admission: RE | Admit: 2013-02-23 | Discharge: 2013-02-23 | Disposition: A | Discharge status: Home / Self Care | Payer: Medicaid Other | Source: Ambulatory Visit | Attending: Gastroenterology | Admitting: Gastroenterology

## 2013-02-23 DIAGNOSIS — K769 Liver disease, unspecified: Secondary | ICD-10-CM

## 2013-02-23 DIAGNOSIS — K7689 Other specified diseases of liver: Secondary | ICD-10-CM | POA: Insufficient documentation

## 2013-02-23 MED ORDER — SODIUM CHLORIDE 0.9 % IV SOLN
INTRAVENOUS | Status: AC
  Filled 2013-02-23: qty 150

## 2013-02-23 MED ORDER — GADOBENATE DIMEGLUMINE 529 MG/ML IV SOLN
14.0000 mL | Freq: Once | INTRAVENOUS | Status: AC | PRN
  Administered 2013-02-23: 14 mL via INTRAVENOUS

## 2013-03-10 ENCOUNTER — Encounter: Payer: Self-pay | Admitting: Cardiology

## 2013-03-30 ENCOUNTER — Encounter: Payer: Self-pay | Admitting: Cardiology

## 2013-03-30 ENCOUNTER — Ambulatory Visit (INDEPENDENT_AMBULATORY_CARE_PROVIDER_SITE_OTHER): Payer: Medicaid Other | Admitting: Cardiology

## 2013-03-30 VITALS — BP 131/82 | HR 99 | Ht 64.0 in | Wt 156.8 lb

## 2013-03-30 DIAGNOSIS — I2510 Atherosclerotic heart disease of native coronary artery without angina pectoris: Secondary | ICD-10-CM

## 2013-03-30 DIAGNOSIS — I714 Abdominal aortic aneurysm, without rupture, unspecified: Secondary | ICD-10-CM

## 2013-03-30 DIAGNOSIS — E785 Hyperlipidemia, unspecified: Secondary | ICD-10-CM

## 2013-03-30 DIAGNOSIS — I10 Essential (primary) hypertension: Secondary | ICD-10-CM

## 2013-03-30 MED ORDER — AMLODIPINE BESYLATE 5 MG PO TABS: 5.0000 mg | ORAL_TABLET | Freq: Every day | ORAL | Status: DC

## 2013-03-30 NOTE — Progress Notes (Signed)
Clinical Summary Carla Hoover is a 63 y.o.female last seen by Dr Percival Spanish, this is our first visit together. She is seen for the following medical problems.   1. CAD - prior PCI 05/2012 with DES to LCX and DES to LAD, LVEF by LV gram 65% - episode of chest pain last week, sharp pain mid chest. 8/10. No other associated symptoms. Discomfort lasted approx 2-3 minutes, resolved with NG. On average having feeling twice a month for 4 months, stable in frequency and severity - compliant with meds  2. Hyperlipidemia - compliant with pravastatin - 05/2012: TC 118 TG 71 HDL 66 LDL 38   3. Abdominal aortic dilatation - recent US shows maximum diameter 3.7 cm which is stable. Denies any abdominal pain.    4. HTN - does not check at home - compliant with meds  5. Altered mental status - recent presentation to [facility] ER 01/2013 with EtOH intoxication, hyponatermia, hypokalemia.    Past Medical History  Diagnosis Date  . Hypertension     a. Hypotension noted 05/2012.  Marland Kitchen Anxiety   . Depression   . Arthritis   . Irritable bowel syndrome (IBS)   . Diverticulitis   . Insomnia   . Eczema   . Gout   . GI bleed     Possible mild GI bleed 04/2011, hemoccult negative at [facility] and no colonoscopy/EGD felt necessary by GI at that time  . Ischemic cardiomyopathy     IMPROVED. a. EF 40-45% by echo at [facility] 04/2011. b. Improved to 55% by echo 05/2012.  Marland Kitchen CAD (coronary artery disease)     a. s/p two overlapping BMS to LAD 05/12/11, Occluded mid LCX with collaterals. b. NSTEMI 05/2012: PTCA/DES to mid Cx and PTCA/DES to prox-mid LAD 05/18/12.  Marland Kitchen Hyperlipidemia   . AAA (abdominal aortic aneurysm) 05/19/2012    a. Infrarenal AAA by CT at [facility] 3.4cm 05/2012.  . Tobacco abuse   . Hepatic lesion     a. Right hepatic lobe density of uncertain significance by CT at [facility] 05/2012 - needs f/u PCP.  Marland Kitchen Acute renal insufficiency     05/2012 - Cr 1.7. Improved.  . Tobacco use disorder 05/25/2012   . GERD (gastroesophageal reflux disease) 01/18/2013     Allergies  Allergen Reactions  . Penicillins Swelling    Per patient report, she has facial and tongue swellling  . Percocet [Oxycodone-Acetaminophen] Other (See Comments)    Hallucinations  . Percodan [Oxycodone-Aspirin] Other (See Comments)    Hallucinations     Current Outpatient Prescriptions  Medication Sig Dispense Refill  . aspirin 81 MG tablet Take 81 mg by mouth daily.      . clopidogrel (PLAVIX) 75 MG tablet Take 1 tablet (75 mg total) by mouth daily.  30 tablet  11  . FLUoxetine (PROZAC) 20 MG capsule Take 20 mg by mouth daily.      . Fluticasone-Salmeterol (ADVAIR DISKUS) 100-50 MCG/DOSE AEPB Inhale 2 puffs into the lungs every 12 (twelve) hours.      . furosemide (LASIX) 20 MG tablet Take 20 mg by mouth daily.       . isosorbide mononitrate (IMDUR) 60 MG 24 hr tablet Take 1.5 tablets (90 mg total) by mouth daily.  46 tablet  6  . LORazepam (ATIVAN) 1 MG tablet Take 1 tablet (1 mg total) by mouth every 6 (six) hours as needed for anxiety.  30 tablet  0  . metoprolol tartrate (LOPRESSOR) 50 MG tablet  Take 1 tablet (50 mg total) by mouth 2 (two) times daily.  60 tablet  6  . nitroGLYCERIN (NITROSTAT) 0.4 MG SL tablet Place 0.4 mg under the tongue every 5 (five) minutes as needed for chest pain. For chest pain      . ondansetron (ZOFRAN) 4 MG tablet Take 1 tablet (4 mg total) by mouth every 8 (eight) hours as needed for nausea or vomiting.  20 tablet  0  . pantoprazole (PROTONIX) 40 MG tablet Take 1 tablet (40 mg total) by mouth daily before breakfast.  30 tablet  11  . polyethylene glycol (MIRALAX / GLYCOLAX) packet Take 17 g by mouth daily.      . pravastatin (PRAVACHOL) 80 MG tablet Take 1 tablet (80 mg total) by mouth daily.  30 tablet  3  . [DISCONTINUED] carvedilol (COREG) 3.125 MG tablet Take 1 tablet (3.125 mg total) by mouth 2 (two) times daily with a meal.  60 tablet  6  . [DISCONTINUED] citalopram (CELEXA) 20  MG tablet Take 1 tablet (20 mg total) by mouth daily.  30 tablet  1   No current facility-administered medications for this visit.     Past Surgical History  Procedure Laterality Date  . Abdominal hysterectomy    . Cardiac catheterization    . Coronary angioplasty  05/2011    2 BMS to mid LAD  . Colonoscopy      about 10 years, Dr. Hinton Lovely  . Colonoscopy N/A 02/15/2013    Procedure: COLONOSCOPY;  Surgeon: Daneil Dolin, MD;  Location: [facility];  Service: Endoscopy;  Laterality: N/A;  2:15-moved to [place] notified pt  . Esophagogastroduodenoscopy (egd) with esophageal dilation N/A 02/15/2013    Procedure: ESOPHAGOGASTRODUODENOSCOPY (EGD) WITH ESOPHAGEAL DILATION;  Surgeon: Daneil Dolin, MD;  Location: [facility];  Service: Endoscopy;  Laterality: N/A;     Allergies  Allergen Reactions  . Penicillins Swelling    Per patient report, she has facial and tongue swellling  . Percocet [Oxycodone-Acetaminophen] Other (See Comments)    Hallucinations  . Percodan [Oxycodone-Aspirin] Other (See Comments)    Hallucinations      Family History  Problem Relation Age of Onset  . Colon cancer Neg Hx   . Lung cancer Brother     recurrent X 4     Social History Carla Hoover reports that she has been smoking Cigarettes.  She has been smoking about 0.00 packs per day for the past 50 years. She has never used smokeless tobacco. Carla Hoover reports that she drinks about 1.2 ounces of alcohol per week.   Review of Systems CONSTITUTIONAL: No weight loss, fever, chills, weakness or fatigue.  HEENT: Eyes: No visual loss, blurred vision, double vision or yellow sclerae.No hearing loss, sneezing, congestion, runny nose or sore throat.  SKIN: No rash or itching.  CARDIOVASCULAR: per HPI RESPIRATORY: No shortness of breath, cough or sputum.  GASTROINTESTINAL: No anorexia, nausea, vomiting or diarrhea. No abdominal pain or blood.  GENITOURINARY: No burning on urination, no  polyuria NEUROLOGICAL: No headache, dizziness, syncope, paralysis, ataxia, numbness or tingling in the extremities. No change in bowel or bladder control.  MUSCULOSKELETAL: No muscle, back pain, joint pain or stiffness.  LYMPHATICS: No enlarged nodes. No history of splenectomy.  PSYCHIATRIC: No history of depression or anxiety.  ENDOCRINOLOGIC: No reports of sweating, cold or heat intolerance. No polyuria or polydipsia.  Marland Kitchen   Physical Examination p 99 bp 131/82 Wt 156 lbs BMI 27 Gen: resting  comfortably, no acute distress HEENT: no scleral icterus, pupils equal round and reactive, no palptable cervical adenopathy,  CV: RRR, no m/r/g, no JVD, no carotid bruits Resp: Clear to auscultation bilaterally GI: abdomen is soft, non-tender, non-distended, normal bowel sounds, no hepatosplenomegaly MSK: extremities are warm, no edema.  Skin: warm, no rash Neuro:  no focal deficits Psych: appropriate affect   Diagnostic Studies  05/2012 Cath Hemodynamic Findings:  Central aortic pressure: 127/66  Left ventricular pressure: 140/18/23  Angiographic Findings:  Left main: Short segment with mild plaque.  Left Anterior Descending Artery: Large caliber vessel that courses to the apex. There are patent overlapping stents in the mid vessel. The proximal portion of the stented segment has severe restenosis. The remainder of the LAD is free of significant disease. There is a moderate caliber diagonal Kealy Lewter with ostial 20% stenosis.  Circumflex Artery: Moderate caliber vessel with moderate sized first Obtuse marginal Idaliz Tinkle with no stenosis. Just beyond the first OM, there is a 99% sub-total occlusion. The second OM Tayo Maute is moderate in caliber supplying a large portion of myocardium with TIMI-1 flow. The AV groove Circumflex beyond the second OM Farouk Vivero is patent.  Right Coronary Artery:Large, dominant vessel with diffuse 30-40% stenosis in the mid vessel. The distal vessel has diffuse 20% stenosis. The PDA  and posterolateral are moderate in caliber and no obstructive disease.  Left Ventricular Angiogram: LVEF=65%.  Impression:  1. Double vessel CAD with severe stenosis mid Circumflex and in the stented segment of the mid LAD  2. Successful PTCA/DES x 1 mid Circumflex  3. Successful PTCA/DS x 1 proximal to mid LAD  4. Normal LV systolic function.    05/2012 Echo LVEF 55%, moderate LVH  11/2012 Abdominal US Maximal dilatation of abdominal aorta 3.7 cm, moderate atherosclerosis. Mild thrombus built up in distal aorta.   Assessment and Plan  1. CAD - chest pain approx twice a month not classic for angina. - will add norvasc 5mg  daily as an additional anti-anginal - if pain increases in frequency or severity, consider repeat ischemic evaluation at that time. Patient asked to notify our office is symptoms change  2. Hyperlipidemia - at goal based on most recent panel, reports she has a PCP physical coming up later this month. Will follow up on those labs - continue current statin  3. Abdominal aortic dilatation - has been stable by imagine, last [place] 11/2012.  - repeat imaging 11/2013  4. HTN - at goal, continue current meds   Follow up 2 months  Arnoldo Lenis, M.D., F.A.C.C.

## 2013-03-30 NOTE — Patient Instructions (Signed)
Your physician recommends that you schedule a follow-up appointment in: 2 months with Dr. Harl Bowie. This appointment will be scheduled today before you leave.  Your physician has recommended you make the following change in your medication:  Start: amlodipine (norvasc) 5 MG 1 tablet by mouth once daily.  Continue all other medications the same.

## 2013-05-30 ENCOUNTER — Other Ambulatory Visit: Payer: Self-pay | Admitting: Physician Assistant

## 2013-06-03 NOTE — Progress Notes (Signed)
Clinical Summary Carla Hoover is a 63 y.o.female seen today for follow up of the following medical problems.   1. CAD  - prior PCI 05/2012 with DES to LCX and DES to LAD, LVEF by LV gram 65%  - described some episodes of chest pain last visit, started norvasc as additional antianginal with no improvement.  - describes chest pain. Aching pain midchest, moderate in severity. Can feel lightheaded with it. Can occur at rest or with exertion. Occurs 2-3 times a week, increase in frequency over the last few weeks. Stable severity.    2. Hyperlipidemia  - compliant with pravastatin  - Last panel 05/2012: TC 118 TG 71 HDL 66 LDL 38   3. Abdominal aortic dilatation  - recent US shows maximum diameter 3.7 cm which is stable. Denies any abdominal pain.   4. HTN  - does not check at home  - compliant with meds   5. Tobacco - smokes 2 cigs per week, has decreased.   6. Lightheadedness - typically occurs with standing. Has had some falls associated with it as well. No palpitations.      Past Medical History  Diagnosis Date  . Hypertension     a. Hypotension noted 05/2012.  Marland Kitchen Anxiety   . Depression   . Arthritis   . Irritable bowel syndrome (IBS)   . Diverticulitis   . Insomnia   . Eczema   . Gout   . GI bleed     Possible mild GI bleed 04/2011, hemoccult negative at [facility] and no colonoscopy/EGD felt necessary by GI at that time  . Ischemic cardiomyopathy     IMPROVED. a. EF 40-45% by echo at [facility] 04/2011. b. Improved to 55% by echo 05/2012.  Marland Kitchen CAD (coronary artery disease)     a. s/p two overlapping BMS to LAD 05/12/11, Occluded mid LCX with collaterals. b. NSTEMI 05/2012: PTCA/DES to mid Cx and PTCA/DES to prox-mid LAD 05/18/12.  Marland Kitchen Hyperlipidemia   . AAA (abdominal aortic aneurysm) 05/19/2012    a. Infrarenal AAA by CT at [facility] 3.4cm 05/2012.  . Tobacco abuse   . Hepatic lesion     a. Right hepatic lobe density of uncertain significance by CT at [facility] 05/2012 -  needs f/u PCP.  Marland Kitchen Acute renal insufficiency     05/2012 - Cr 1.7. Improved.  . Tobacco use disorder 05/25/2012  . GERD (gastroesophageal reflux disease) 01/18/2013     Allergies  Allergen Reactions  . Penicillins Swelling    Per patient report, she has facial and tongue swellling  . Percocet [Oxycodone-Acetaminophen] Other (See Comments)    Hallucinations  . Percodan [Oxycodone-Aspirin] Other (See Comments)    Hallucinations     Current Outpatient Prescriptions  Medication Sig Dispense Refill  . amLODipine (NORVASC) 5 MG tablet Take 1 tablet (5 mg total) by mouth daily.  30 tablet  6  . aspirin 81 MG tablet Take 81 mg by mouth daily.      . clopidogrel (PLAVIX) 75 MG tablet Take 1 tablet (75 mg total) by mouth daily.  30 tablet  11  . FLUoxetine (PROZAC) 20 MG capsule Take 20 mg by mouth daily.      . Fluticasone-Salmeterol (ADVAIR DISKUS) 100-50 MCG/DOSE AEPB Inhale 2 puffs into the lungs every 12 (twelve) hours.      . furosemide (LASIX) 20 MG tablet Take 20 mg by mouth daily.       . isosorbide mononitrate (IMDUR) 60 MG 24 hr  tablet Take 1.5 tablets (90 mg total) by mouth daily.  46 tablet  6  . LORazepam (ATIVAN) 1 MG tablet Take 1 tablet (1 mg total) by mouth every 6 (six) hours as needed for anxiety.  30 tablet  0  . metoprolol (LOPRESSOR) 50 MG tablet TAKE 1 TABLET BY MOUTH TWICE DAILY  60 tablet  6  . nitroGLYCERIN (NITROSTAT) 0.4 MG SL tablet Place 0.4 mg under the tongue every 5 (five) minutes as needed for chest pain. For chest pain      . pantoprazole (PROTONIX) 40 MG tablet Take 1 tablet (40 mg total) by mouth daily before breakfast.  30 tablet  11  . polyethylene glycol (MIRALAX / GLYCOLAX) packet Take 17 g by mouth daily.      . pravastatin (PRAVACHOL) 80 MG tablet Take 1 tablet (80 mg total) by mouth daily.  30 tablet  3  . [DISCONTINUED] carvedilol (COREG) 3.125 MG tablet Take 1 tablet (3.125 mg total) by mouth 2 (two) times daily with a meal.  60 tablet  6  .  [DISCONTINUED] citalopram (CELEXA) 20 MG tablet Take 1 tablet (20 mg total) by mouth daily.  30 tablet  1   No current facility-administered medications for this visit.     Past Surgical History  Procedure Laterality Date  . Abdominal hysterectomy    . Cardiac catheterization    . Coronary angioplasty  05/2011    2 BMS to mid LAD  . Colonoscopy      about 10 years, Dr. Hinton Lovely  . Colonoscopy N/A 02/15/2013    Procedure: COLONOSCOPY;  Surgeon: Daneil Dolin, MD;  [facility]: [facility];  Service: Endoscopy;  Laterality: N/A;  2:15-moved to [place] notified pt  . Esophagogastroduodenoscopy (egd) with esophageal dilation N/A 02/15/2013    Procedure: ESOPHAGOGASTRODUODENOSCOPY (EGD) WITH ESOPHAGEAL DILATION;  Surgeon: Daneil Dolin, MD;  Location: [facility];  Service: Endoscopy;  Laterality: N/A;     Allergies  Allergen Reactions  . Penicillins Swelling    Per patient report, she has facial and tongue swellling  . Percocet [Oxycodone-Acetaminophen] Other (See Comments)    Hallucinations  . Percodan [Oxycodone-Aspirin] Other (See Comments)    Hallucinations      Family History  Problem Relation Age of Onset  . Colon cancer Neg Hx   . Lung cancer Brother     recurrent X 4     Social History Carla Hoover reports that she has been smoking Cigarettes.  She has been smoking about 0.00 packs per day for the past 50 years. She has never used smokeless tobacco. Carla Hoover reports that she drinks about 1.2 ounces of alcohol per week.   Review of Systems CONSTITUTIONAL: No weight loss, fever, chills, weakness or fatigue.  HEENT: Eyes: No visual loss, blurred vision, double vision or yellow sclerae.No hearing loss, sneezing, congestion, runny nose or sore throat.  SKIN: No rash or itching.  CARDIOVASCULAR: per HPI RESPIRATORY: No shortness of breath, cough or sputum.  GASTROINTESTINAL: No anorexia, nausea, vomiting or diarrhea. No abdominal pain or blood.    GENITOURINARY: No burning on urination, no polyuria NEUROLOGICAL: +dizziness MUSCULOSKELETAL: No muscle, back pain, joint pain or stiffness.  LYMPHATICS: No enlarged nodes. No history of splenectomy.  PSYCHIATRIC: No history of depression or anxiety.  ENDOCRINOLOGIC: No reports of sweating, cold or heat intolerance. No polyuria or polydipsia.  Marland Kitchen   Physical Examination p 98 bp 110/70 Wt 154 lbs BMI 26 Gen: resting comfortably, no acute  distress HEENT: no scleral icterus, pupils equal round and reactive, no palptable cervical adenopathy,  CV: RRR, no m/r/g,no JVD, no carotid bruits Resp: Clear to auscultation bilaterally GI: abdomen is soft, non-tender, non-distended, normal bowel sounds, no hepatosplenomegaly MSK: extremities are warm, no edema.  Skin: warm, no rash Neuro:  no focal deficits Psych: appropriate affect   Diagnostic Studies 05/2012 Cath  Hemodynamic Findings:  Central aortic pressure: 127/66  Left ventricular pressure: 140/18/23  Angiographic Findings:  Left main: Short segment with mild plaque.  Left Anterior Descending Artery: Large caliber vessel that courses to the apex. There are patent overlapping stents in the mid vessel. The proximal portion of the stented segment has severe restenosis. The remainder of the LAD is free of significant disease. There is a moderate caliber diagonal Kwamane Whack with ostial 20% stenosis.  Circumflex Artery: Moderate caliber vessel with moderate sized first Obtuse marginal Kimberla Driskill with no stenosis. Just beyond the first OM, there is a 99% sub-total occlusion. The second OM Kyndal Heringer is moderate in caliber supplying a large portion of myocardium with TIMI-1 flow. The AV groove Circumflex beyond the second OM Moneisha Vosler is patent.  Right Coronary Artery:Large, dominant vessel with diffuse 30-40% stenosis in the mid vessel. The distal vessel has diffuse 20% stenosis. The PDA and posterolateral are moderate in caliber and no obstructive disease.  Left  Ventricular Angiogram: LVEF=65%.  Impression:  1. Double vessel CAD with severe stenosis mid Circumflex and in the stented segment of the mid LAD  2. Successful PTCA/DES x 1 mid Circumflex  3. Successful PTCA/DS x 1 proximal to mid LAD  4. Normal LV systolic function.    05/2012 Echo  LVEF 55%, moderate LVH   11/2012 Abdominal US  Maximal dilatation of abdominal aorta 3.7 cm, moderate atherosclerosis. Mild thrombus built up in distal aorta.      Assessment and Plan  1. CAD  - progressing chest pain despite 3 antianginals, she likely is having orthostatic symptoms as well from this therapy. - will refer for cath to evaluate for obstructive ischemic disease in the setting of known CAD, and persistent chest pain despite aggressive antianginal therapy.   2. Hyperlipidemia  - at goal based on most recent panel  - continue current statin   3. Abdominal aortic dilatation  - has been stable by imaging, last [place] 11/2012.  - repeat imaging 11/2013   4. HTN  - at goal, continue current meds  5. Tobacco - continues to cut down, down to 2 cigs per week. Counseled of further cessation and associated health benefits  6. Lightheadedness - symptoms consistent with orthostatic dizziness/hypotension, likely mediation related. She has had some falls with it as well - stop norvasc, decrease imdur to 60mg  daily.    Follow up after heart cath   Arnoldo Lenis, M.D., F.A.C.C.

## 2013-06-04 ENCOUNTER — Ambulatory Visit (INDEPENDENT_AMBULATORY_CARE_PROVIDER_SITE_OTHER): Payer: Medicare Other | Admitting: Cardiology

## 2013-06-04 ENCOUNTER — Telehealth: Payer: Self-pay | Admitting: Cardiology

## 2013-06-04 ENCOUNTER — Encounter: Payer: Self-pay | Admitting: Cardiology

## 2013-06-04 VITALS — BP 110/70 | HR 98 | Ht 64.0 in | Wt 154.0 lb

## 2013-06-04 DIAGNOSIS — I10 Essential (primary) hypertension: Secondary | ICD-10-CM

## 2013-06-04 DIAGNOSIS — E785 Hyperlipidemia, unspecified: Secondary | ICD-10-CM

## 2013-06-04 DIAGNOSIS — I2510 Atherosclerotic heart disease of native coronary artery without angina pectoris: Secondary | ICD-10-CM

## 2013-06-04 DIAGNOSIS — R079 Chest pain, unspecified: Secondary | ICD-10-CM

## 2013-06-04 MED ORDER — ISOSORBIDE MONONITRATE ER 60 MG PO TB24: 60.0000 mg | ORAL_TABLET | Freq: Every day | ORAL | Status: AC

## 2013-06-04 NOTE — Patient Instructions (Signed)
You will be given a follow up appointment when you are discharged from hospital after your heart catherization.   Your physician has recommended you make the following change in your medication:  Stop: Amlodipine (Norvasc) Decrease: Imdur 60 MG take 1 tablet by mouth once daily.  Continue all other medications the same.   Your physician has requested that you have a cardiac catheterization. Cardiac catheterization is used to diagnose and/or treat various heart conditions. Doctors may recommend this procedure for a number of different reasons. The most common reason is to evaluate chest pain. Chest pain can be a symptom of coronary artery disease (CAD), and cardiac catheterization can show whether plaque is narrowing or blocking your heart's arteries. This procedure is also used to evaluate the valves, as well as measure the blood flow and oxygen levels in different parts of your heart. For further information please visit [email or web address]. Please follow instruction sheet, as given.

## 2013-06-04 NOTE — Telephone Encounter (Signed)
L heart cath with coronary angio DX: chest pain Scheduled 4-1 @ 11:30 Dr. Angelena Form

## 2013-06-11 ENCOUNTER — Other Ambulatory Visit: Payer: Self-pay | Admitting: Cardiology

## 2013-06-11 ENCOUNTER — Encounter (HOSPITAL_COMMUNITY): Payer: Self-pay | Admitting: Pharmacy Technician

## 2013-06-11 NOTE — Telephone Encounter (Signed)
No precert required 

## 2013-06-12 ENCOUNTER — Other Ambulatory Visit: Payer: Self-pay | Admitting: Cardiology

## 2013-06-12 DIAGNOSIS — R079 Chest pain, unspecified: Secondary | ICD-10-CM

## 2013-06-13 ENCOUNTER — Encounter (HOSPITAL_COMMUNITY)
Admission: RE | Disposition: A | Discharge status: Home / Self Care | Payer: Medicare Other | Source: Ambulatory Visit | Attending: Cardiovascular Disease

## 2013-06-13 ENCOUNTER — Ambulatory Visit (HOSPITAL_COMMUNITY)
Admission: RE | Admit: 2013-06-13 | Discharge: 2013-06-13 | Disposition: A | Discharge status: Home / Self Care | Payer: Medicare Other | Source: Ambulatory Visit | Attending: Cardiovascular Disease | Admitting: Cardiovascular Disease

## 2013-06-13 DIAGNOSIS — I2589 Other forms of chronic ischemic heart disease: Secondary | ICD-10-CM | POA: Insufficient documentation

## 2013-06-13 DIAGNOSIS — K7689 Other specified diseases of liver: Secondary | ICD-10-CM | POA: Insufficient documentation

## 2013-06-13 DIAGNOSIS — E785 Hyperlipidemia, unspecified: Secondary | ICD-10-CM | POA: Insufficient documentation

## 2013-06-13 DIAGNOSIS — I10 Essential (primary) hypertension: Secondary | ICD-10-CM | POA: Insufficient documentation

## 2013-06-13 DIAGNOSIS — F17200 Nicotine dependence, unspecified, uncomplicated: Secondary | ICD-10-CM | POA: Insufficient documentation

## 2013-06-13 DIAGNOSIS — F411 Generalized anxiety disorder: Secondary | ICD-10-CM | POA: Insufficient documentation

## 2013-06-13 DIAGNOSIS — Z7982 Long term (current) use of aspirin: Secondary | ICD-10-CM | POA: Insufficient documentation

## 2013-06-13 DIAGNOSIS — G4700 Insomnia, unspecified: Secondary | ICD-10-CM | POA: Insufficient documentation

## 2013-06-13 DIAGNOSIS — R079 Chest pain, unspecified: Secondary | ICD-10-CM | POA: Insufficient documentation

## 2013-06-13 DIAGNOSIS — L259 Unspecified contact dermatitis, unspecified cause: Secondary | ICD-10-CM | POA: Insufficient documentation

## 2013-06-13 DIAGNOSIS — I2510 Atherosclerotic heart disease of native coronary artery without angina pectoris: Secondary | ICD-10-CM | POA: Insufficient documentation

## 2013-06-13 DIAGNOSIS — Z9861 Coronary angioplasty status: Secondary | ICD-10-CM | POA: Insufficient documentation

## 2013-06-13 DIAGNOSIS — M129 Arthropathy, unspecified: Secondary | ICD-10-CM | POA: Insufficient documentation

## 2013-06-13 DIAGNOSIS — R42 Dizziness and giddiness: Secondary | ICD-10-CM | POA: Insufficient documentation

## 2013-06-13 DIAGNOSIS — Z7902 Long term (current) use of antithrombotics/antiplatelets: Secondary | ICD-10-CM | POA: Insufficient documentation

## 2013-06-13 DIAGNOSIS — I714 Abdominal aortic aneurysm, without rupture, unspecified: Secondary | ICD-10-CM | POA: Insufficient documentation

## 2013-06-13 DIAGNOSIS — F329 Major depressive disorder, single episode, unspecified: Secondary | ICD-10-CM | POA: Insufficient documentation

## 2013-06-13 DIAGNOSIS — K219 Gastro-esophageal reflux disease without esophagitis: Secondary | ICD-10-CM | POA: Insufficient documentation

## 2013-06-13 DIAGNOSIS — M109 Gout, unspecified: Secondary | ICD-10-CM | POA: Insufficient documentation

## 2013-06-13 DIAGNOSIS — F3289 Other specified depressive episodes: Secondary | ICD-10-CM | POA: Insufficient documentation

## 2013-06-13 DIAGNOSIS — K589 Irritable bowel syndrome without diarrhea: Secondary | ICD-10-CM | POA: Insufficient documentation

## 2013-06-13 HISTORY — PX: LEFT HEART CATHETERIZATION WITH CORONARY ANGIOGRAM: SHX5451

## 2013-06-13 LAB — CBC
HCT: 34.7 % — ABNORMAL LOW (ref 36.0–46.0)
Hemoglobin: 11.8 g/dL — ABNORMAL LOW (ref 12.0–15.0)
MCH: 33.2 pg (ref 26.0–34.0)
MCHC: 34 g/dL (ref 30.0–36.0)
MCV: 97.7 fL (ref 78.0–100.0)
Platelets: 163 10*3/uL (ref 150–400)
RBC: 3.55 MIL/uL — AB (ref 3.87–5.11)
RDW: 14.3 % (ref 11.5–15.5)
WBC: 6.4 10*3/uL (ref 4.0–10.5)

## 2013-06-13 LAB — BASIC METABOLIC PANEL
BUN: 12 mg/dL (ref 6–23)
CHLORIDE: 100 meq/L (ref 96–112)
CO2: 19 meq/L (ref 19–32)
CREATININE: 0.99 mg/dL (ref 0.50–1.10)
Calcium: 8.6 mg/dL (ref 8.4–10.5)
GFR calc Af Amer: 68 mL/min — ABNORMAL LOW (ref >90)
GFR calc non Af Amer: 59 mL/min — ABNORMAL LOW (ref >90)
Glucose, Bld: 92 mg/dL (ref 70–99)
POTASSIUM: 4.2 meq/L (ref 3.7–5.3)
SODIUM: 137 meq/L (ref 137–147)

## 2013-06-13 LAB — PROTIME-INR
INR: 0.98 (ref 0.00–1.49)
PROTHROMBIN TIME: 12.8 s (ref 11.6–15.2)

## 2013-06-13 SURGERY — LEFT HEART CATHETERIZATION WITH CORONARY ANGIOGRAM
Anesthesia: LOCAL

## 2013-06-13 MED ORDER — VERAPAMIL HCL 2.5 MG/ML IV SOLN
INTRAVENOUS | Status: AC
  Filled 2013-06-13: qty 2

## 2013-06-13 MED ORDER — LIDOCAINE HCL (PF) 1 % IJ SOLN
INTRAMUSCULAR | Status: AC
  Filled 2013-06-13: qty 30

## 2013-06-13 MED ORDER — HEPARIN SODIUM (PORCINE) 1000 UNIT/ML IJ SOLN
INTRAMUSCULAR | Status: AC
  Filled 2013-06-13: qty 1

## 2013-06-13 MED ORDER — SODIUM CHLORIDE 0.9 % IV SOLN: 250.0000 mL | INTRAVENOUS | Status: DC | PRN

## 2013-06-13 MED ORDER — SODIUM CHLORIDE 0.9 % IJ SOLN: 3.0000 mL | Freq: Two times a day (BID) | INTRAMUSCULAR | Status: DC

## 2013-06-13 MED ORDER — SODIUM CHLORIDE 0.9 % IV SOLN: INTRAVENOUS | Status: AC

## 2013-06-13 MED ORDER — HEPARIN (PORCINE) IN NACL 2-0.9 UNIT/ML-% IJ SOLN
INTRAMUSCULAR | Status: AC
  Filled 2013-06-13: qty 1000

## 2013-06-13 MED ORDER — FENTANYL CITRATE 0.05 MG/ML IJ SOLN
INTRAMUSCULAR | Status: AC
  Filled 2013-06-13: qty 2

## 2013-06-13 MED ORDER — ASPIRIN 81 MG PO CHEW: 81.0000 mg | CHEWABLE_TABLET | ORAL | Status: DC

## 2013-06-13 MED ORDER — SODIUM CHLORIDE 0.9 % IV SOLN
INTRAVENOUS | Status: DC
  Administered 2013-06-13: 11:00:00 via INTRAVENOUS

## 2013-06-13 MED ORDER — MIDAZOLAM HCL 2 MG/2ML IJ SOLN
INTRAMUSCULAR | Status: AC
  Filled 2013-06-13: qty 2

## 2013-06-13 MED ORDER — NITROGLYCERIN 0.2 MG/ML ON CALL CATH LAB
INTRAVENOUS | Status: AC
  Filled 2013-06-13: qty 1

## 2013-06-13 MED ORDER — SODIUM CHLORIDE 0.9 % IJ SOLN: 3.0000 mL | INTRAMUSCULAR | Status: DC | PRN

## 2013-06-13 NOTE — Progress Notes (Signed)
TRB removed. Slight oozing and bruising at site. Carla Hoover from cath lab came to assess site.  Pressure held for 15 min. No further bleeding or bruising noted.  Dressed with tegederm and 2x2s. Site unremarkable.

## 2013-06-13 NOTE — H&P (View-Only) (Signed)
   Clinical Summary Ms. Gorby is a 63 y.o.female seen today for follow up of the following medical problems.   1. CAD  - prior PCI 05/2012 with DES to LCX and DES to LAD, LVEF by LV gram 65%  - described some episodes of chest pain last visit, started norvasc as additional antianginal with no improvement.  - describes chest pain. Aching pain midchest, moderate in severity. Can feel lightheaded with it. Can occur at rest or with exertion. Occurs 2-3 times a week, increase in frequency over the last few weeks. Stable severity.    2. Hyperlipidemia  - compliant with pravastatin  - Last panel 05/2012: TC 118 TG 71 HDL 66 LDL 38   3. Abdominal aortic dilatation  - recent US shows maximum diameter 3.7 cm which is stable. Denies any abdominal pain.   4. HTN  - does not check at home  - compliant with meds   5. Tobacco - smokes 2 cigs per week, has decreased.   6. Lightheadedness - typically occurs with standing. Has had some falls associated with it as well. No palpitations.      Past Medical History  Diagnosis Date  . Hypertension     a. Hypotension noted 05/2012.  . Anxiety   . Depression   . Arthritis   . Irritable bowel syndrome (IBS)   . Diverticulitis   . Insomnia   . Eczema   . Gout   . GI bleed     Possible mild GI bleed 04/2011, hemoccult negative at [place] and no colonoscopy/EGD felt necessary by GI at that time  . Ischemic cardiomyopathy     IMPROVED. a. EF 40-45% by echo at [facility] 04/2011. b. Improved to 55% by echo 05/2012.  . CAD (coronary artery disease)     a. s/p two overlapping BMS to LAD 05/12/11, Occluded mid LCX with collaterals. b. NSTEMI 05/2012: PTCA/DES to mid Cx and PTCA/DES to prox-mid LAD 05/18/12.  . Hyperlipidemia   . AAA (abdominal aortic aneurysm) 05/19/2012    a. Infrarenal AAA by CT at [facility] 3.4cm 05/2012.  . Tobacco abuse   . Hepatic lesion     a. Right hepatic lobe density of uncertain significance by CT at [facility] 05/2012 -  needs f/u PCP.  . Acute renal insufficiency     05/2012 - Cr 1.7. Improved.  . Tobacco use disorder 05/25/2012  . GERD (gastroesophageal reflux disease) 01/18/2013     Allergies  Allergen Reactions  . Penicillins Swelling    Per patient report, she has facial and tongue swellling  . Percocet [Oxycodone-Acetaminophen] Other (See Comments)    Hallucinations  . Percodan [Oxycodone-Aspirin] Other (See Comments)    Hallucinations     Current Outpatient Prescriptions  Medication Sig Dispense Refill  . amLODipine (NORVASC) 5 MG tablet Take 1 tablet (5 mg total) by mouth daily.  30 tablet  6  . aspirin 81 MG tablet Take 81 mg by mouth daily.      . clopidogrel (PLAVIX) 75 MG tablet Take 1 tablet (75 mg total) by mouth daily.  30 tablet  11  . FLUoxetine (PROZAC) 20 MG capsule Take 20 mg by mouth daily.      . Fluticasone-Salmeterol (ADVAIR DISKUS) 100-50 MCG/DOSE AEPB Inhale 2 puffs into the lungs every 12 (twelve) hours.      . furosemide (LASIX) 20 MG tablet Take 20 mg by mouth daily.       . isosorbide mononitrate (IMDUR) 60 MG 24 hr   tablet Take 1.5 tablets (90 mg total) by mouth daily.  46 tablet  6  . LORazepam (ATIVAN) 1 MG tablet Take 1 tablet (1 mg total) by mouth every 6 (six) hours as needed for anxiety.  30 tablet  0  . metoprolol (LOPRESSOR) 50 MG tablet TAKE 1 TABLET BY MOUTH TWICE DAILY  60 tablet  6  . nitroGLYCERIN (NITROSTAT) 0.4 MG SL tablet Place 0.4 mg under the tongue every 5 (five) minutes as needed for chest pain. For chest pain      . pantoprazole (PROTONIX) 40 MG tablet Take 1 tablet (40 mg total) by mouth daily before breakfast.  30 tablet  11  . polyethylene glycol (MIRALAX / GLYCOLAX) packet Take 17 g by mouth daily.      . pravastatin (PRAVACHOL) 80 MG tablet Take 1 tablet (80 mg total) by mouth daily.  30 tablet  3  . [DISCONTINUED] carvedilol (COREG) 3.125 MG tablet Take 1 tablet (3.125 mg total) by mouth 2 (two) times daily with a meal.  60 tablet  6  .  [DISCONTINUED] citalopram (CELEXA) 20 MG tablet Take 1 tablet (20 mg total) by mouth daily.  30 tablet  1   No current facility-administered medications for this visit.     Past Surgical History  Procedure Laterality Date  . Abdominal hysterectomy    . Cardiac catheterization    . Coronary angioplasty  05/2011    2 BMS to mid LAD  . Colonoscopy      about 10 years, Dr. Fleichman  . Colonoscopy N/A 02/15/2013    Procedure: COLONOSCOPY;  Surgeon: Robert M Rourk, MD;  [facility];  Service: Endoscopy;  Laterality: N/A;  2:15-moved to [address] [place] notified pt  . Esophagogastroduodenoscopy (egd) with esophageal dilation N/A 02/15/2013    Procedure: ESOPHAGOGASTRODUODENOSCOPY (EGD) WITH ESOPHAGEAL DILATION;  Surgeon: Robert M Rourk, MD;  Location: [facility];  Service: Endoscopy;  Laterality: N/A;     Allergies  Allergen Reactions  . Penicillins Swelling    Per patient report, she has facial and tongue swellling  . Percocet [Oxycodone-Acetaminophen] Other (See Comments)    Hallucinations  . Percodan [Oxycodone-Aspirin] Other (See Comments)    Hallucinations      Family History  Problem Relation Age of Onset  . Colon cancer Neg Hx   . Lung cancer Brother     recurrent X 4     Social History Ms. Bouffard reports that she has been smoking Cigarettes.  She has been smoking about 0.00 packs per day for the past 50 years. She has never used smokeless tobacco. Ms. Pautler reports that she drinks about 1.2 ounces of alcohol per week.   Review of Systems CONSTITUTIONAL: No weight loss, fever, chills, weakness or fatigue.  HEENT: Eyes: No visual loss, blurred vision, double vision or yellow sclerae.No hearing loss, sneezing, congestion, runny nose or sore throat.  SKIN: No rash or itching.  CARDIOVASCULAR: per HPI RESPIRATORY: No shortness of breath, cough or sputum.  GASTROINTESTINAL: No anorexia, nausea, vomiting or diarrhea. No abdominal pain or blood.    GENITOURINARY: No burning on urination, no polyuria NEUROLOGICAL: +dizziness MUSCULOSKELETAL: No muscle, back pain, joint pain or stiffness.  LYMPHATICS: No enlarged nodes. No history of splenectomy.  PSYCHIATRIC: No history of depression or anxiety.  ENDOCRINOLOGIC: No reports of sweating, cold or heat intolerance. No polyuria or polydipsia.  .   Physical Examination p 98 bp 110/70 Wt 154 lbs BMI 26 Gen: resting comfortably, no acute   distress HEENT: no scleral icterus, pupils equal round and reactive, no palptable cervical adenopathy,  CV: RRR, no m/r/g,no JVD, no carotid bruits Resp: Clear to auscultation bilaterally GI: abdomen is soft, non-tender, non-distended, normal bowel sounds, no hepatosplenomegaly MSK: extremities are warm, no edema.  Skin: warm, no rash Neuro:  no focal deficits Psych: appropriate affect   Diagnostic Studies 05/2012 Cath  Hemodynamic Findings:  Central aortic pressure: 127/66  Left ventricular pressure: 140/18/23  Angiographic Findings:  Left main: Short segment with mild plaque.  Left Anterior Descending Artery: Large caliber vessel that courses to the apex. There are patent overlapping stents in the mid vessel. The proximal portion of the stented segment has severe restenosis. The remainder of the LAD is free of significant disease. There is a moderate caliber diagonal Cova Knieriem with ostial 20% stenosis.  Circumflex Artery: Moderate caliber vessel with moderate sized first Obtuse marginal Latrice Storlie with no stenosis. Just beyond the first OM, there is a 99% sub-total occlusion. The second OM Donovyn Guidice is moderate in caliber supplying a large portion of myocardium with TIMI-1 flow. The AV groove Circumflex beyond the second OM Jaymz Traywick is patent.  Right Coronary Artery:Large, dominant vessel with diffuse 30-40% stenosis in the mid vessel. The distal vessel has diffuse 20% stenosis. The PDA and posterolateral are moderate in caliber and no obstructive disease.  Left  Ventricular Angiogram: LVEF=65%.  Impression:  1. Double vessel CAD with severe stenosis mid Circumflex and in the stented segment of the mid LAD  2. Successful PTCA/DES x 1 mid Circumflex  3. Successful PTCA/DS x 1 proximal to mid LAD  4. Normal LV systolic function.    05/2012 Echo  LVEF 55%, moderate LVH   11/2012 Abdominal US  Maximal dilatation of abdominal aorta 3.7 cm, moderate atherosclerosis. Mild thrombus built up in distal aorta.      Assessment and Plan  1. CAD  - progressing chest pain despite 3 antianginals, she likely is having orthostatic symptoms as well from this therapy. - will refer for cath to evaluate for obstructive ischemic disease in the setting of known CAD, and persistent chest pain despite aggressive antianginal therapy.   2. Hyperlipidemia  - at goal based on most recent panel  - continue current statin   3. Abdominal aortic dilatation  - has been stable by imaging, last [place] 11/2012.  - repeat imaging 11/2013   4. HTN  - at goal, continue current meds  5. Tobacco - continues to cut down, down to 2 cigs per week. Counseled of further cessation and associated health benefits  6. Lightheadedness - symptoms consistent with orthostatic dizziness/hypotension, likely mediation related. She has had some falls with it as well - stop norvasc, decrease imdur to 60mg  daily.    Follow up after heart cath   Arnoldo Lenis, M.D., F.A.C.C.

## 2013-06-13 NOTE — Progress Notes (Signed)
Pt oozing from TRB.  3cc of air reinjected. Pt continued to ooze.  3cc more of air again injected.  Oozing stopped.  Pt VSS. Will wait 30 min before starting over.  Pt has 11cc total in band. Will start over at 1445.

## 2013-06-13 NOTE — Interval H&P Note (Signed)
History and Physical Interval Note:  06/13/2013 11:59 AM  Carla Hoover  has presented today for cardiac cath with the diagnosis of CP, known CAD.  The various methods of treatment have been discussed with the patient and family. After consideration of risks, benefits and other options for treatment, the patient has consented to  Procedure(s): LEFT HEART CATHETERIZATION WITH CORONARY ANGIOGRAM (N/A) as a surgical intervention .  The patient's history has been reviewed, patient examined, no change in status, stable for surgery.  I have reviewed the patient's chart and labs.  Questions were answered to the patient's satisfaction.    Cath Lab Visit (complete for each Cath Lab visit)  Clinical Evaluation Leading to the Procedure:   ACS: no  Non-ACS:    Anginal Classification: CCS III  Anti-ischemic medical therapy: Maximal Therapy (2 or more classes of medications)  Non-Invasive Test Results: No non-invasive testing performed  Prior CABG: No previous CABG        Carla Hoover

## 2013-06-13 NOTE — Progress Notes (Signed)
Dressing DDI no further bleeding or bruising

## 2013-06-13 NOTE — Discharge Instructions (Signed)

## 2013-06-13 NOTE — CV Procedure (Signed)
      Cardiac Catheterization Operative Report  Carla Hoover [id number] 4/1/201512:37 PM VYAS,DHRUV B., MD  Procedure Performed:  1. Left Heart Catheterization 2. Selective Coronary Angiography 3. Left ventricular angiogram  Operator: Lauree Chandler, MD  Arterial access site:  Right radial artery.   Indication: 63 yo female with history of CAD with bare metal stent stent x 2 mid LAD in 2013, DES x 1 proximal LAD in 2014 and DES x 1 mid Circumflex in 2014 who has had recent chest pain c/w angina.                                     Procedure Details: The risks, benefits, complications, treatment options, and expected outcomes were discussed with the patient. The patient and/or family concurred with the proposed plan, giving informed consent. The patient was brought to the cath lab after IV hydration was begun and oral premedication was given. The patient was further sedated with Versed and Fentanyl. The right wrist was assessed with an Allens test which was positive. The right wrist was prepped and draped in a sterile fashion. 1% lidocaine was used for local anesthesia. Using the modified Seldinger access technique, a 5 French sheath was placed in the right radial artery. 3 mg Verapamil was given through the sheath. 3500 units IV heparin was given. The JR4 was used to engage the RCA and an EZRAD left was used to engage the left main. Selective angiography was performed. A pigtail catheter was used to perform a left ventricular angiogram. The sheath was removed from the right radial artery and a Terumo hemostasis band was applied at the arteriotomy site on the right wrist.   There were no immediate complications. The patient was taken to the recovery area in stable condition.   Hemodynamic Findings: Central aortic pressure: 136/60 Left ventricular pressure: 143/10/18  Angiographic Findings:  Left main: Short segment with mild plaque.   Left Anterior Descending Artery: Large  caliber vessel that courses to the apex. There are patent overlapping stents in the mid vessel without significant restenosis. The remainder of the LAD is free of significant disease. There is a focal 40% stenosis in the distal vessel. There are two moderate caliber diagonal branches. The first diagonal branch has ostial 40% stenosis. The second diagonal branch has ostial 20% stenosis.   Circumflex Artery: Moderate caliber vessel with very small caliber first obtuse marginal branch with no stenosis. There is a patent stent in the mid AV groove Circumflex with 30% restenosis in the distal portion of the stent.  The second OM branch is moderate in caliber with diffuse proximal 20% stenosis. The AV groove Circumflex beyond the second OM branch is small in caliber and patent.   Right Coronary Artery:Large, dominant vessel with diffuse 30-40% stenosis in the mid vessel. The distal vessel has diffuse 20% stenosis. The PDA and posterolateral are moderate in caliber and no obstructive disease.   Left Ventricular Angiogram: LVEF=65-70%.   Impression: 1. Stable double vessel CAD with patent Circumflex and LAD stents 2. Normal LV systolic function  Recommendations: Continue medical management of CAD. Tobacco cessation.        Complications:  None. The patient tolerated the procedure well.

## 2013-06-18 ENCOUNTER — Other Ambulatory Visit: Payer: Self-pay | Admitting: Cardiology

## 2013-06-18 MED ORDER — CLOPIDOGREL BISULFATE 75 MG PO TABS
75.0000 mg | ORAL_TABLET | Freq: Every day | ORAL | Status: AC
Start: 2013-06-18 — End: ?

## 2013-06-29 ENCOUNTER — Emergency Department (HOSPITAL_COMMUNITY)
Admission: EM | Admit: 2013-06-29 | Discharge: 2013-06-30 | Disposition: A | Discharge status: Home / Self Care | Payer: Medicare Other | Attending: Emergency Medicine | Admitting: Emergency Medicine

## 2013-06-29 ENCOUNTER — Encounter (HOSPITAL_COMMUNITY): Payer: Self-pay | Admitting: Emergency Medicine

## 2013-06-29 DIAGNOSIS — Z872 Personal history of diseases of the skin and subcutaneous tissue: Secondary | ICD-10-CM | POA: Diagnosis not present

## 2013-06-29 DIAGNOSIS — M6281 Muscle weakness (generalized): Secondary | ICD-10-CM | POA: Insufficient documentation

## 2013-06-29 DIAGNOSIS — Y929 Unspecified place or not applicable: Secondary | ICD-10-CM | POA: Insufficient documentation

## 2013-06-29 DIAGNOSIS — F3289 Other specified depressive episodes: Secondary | ICD-10-CM | POA: Diagnosis not present

## 2013-06-29 DIAGNOSIS — Z7982 Long term (current) use of aspirin: Secondary | ICD-10-CM | POA: Insufficient documentation

## 2013-06-29 DIAGNOSIS — K219 Gastro-esophageal reflux disease without esophagitis: Secondary | ICD-10-CM | POA: Insufficient documentation

## 2013-06-29 DIAGNOSIS — M129 Arthropathy, unspecified: Secondary | ICD-10-CM | POA: Insufficient documentation

## 2013-06-29 DIAGNOSIS — S1093XA Contusion of unspecified part of neck, initial encounter: Secondary | ICD-10-CM | POA: Diagnosis not present

## 2013-06-29 DIAGNOSIS — Y9389 Activity, other specified: Secondary | ICD-10-CM | POA: Diagnosis not present

## 2013-06-29 DIAGNOSIS — W1809XA Striking against other object with subsequent fall, initial encounter: Secondary | ICD-10-CM | POA: Diagnosis not present

## 2013-06-29 DIAGNOSIS — F411 Generalized anxiety disorder: Secondary | ICD-10-CM | POA: Diagnosis not present

## 2013-06-29 DIAGNOSIS — E785 Hyperlipidemia, unspecified: Secondary | ICD-10-CM | POA: Insufficient documentation

## 2013-06-29 DIAGNOSIS — S0010XA Contusion of unspecified eyelid and periocular area, initial encounter: Secondary | ICD-10-CM | POA: Insufficient documentation

## 2013-06-29 DIAGNOSIS — Z880 Allergy status to penicillin: Secondary | ICD-10-CM | POA: Insufficient documentation

## 2013-06-29 DIAGNOSIS — F329 Major depressive disorder, single episode, unspecified: Secondary | ICD-10-CM | POA: Insufficient documentation

## 2013-06-29 DIAGNOSIS — Z7902 Long term (current) use of antithrombotics/antiplatelets: Secondary | ICD-10-CM | POA: Diagnosis not present

## 2013-06-29 DIAGNOSIS — Z9861 Coronary angioplasty status: Secondary | ICD-10-CM | POA: Diagnosis not present

## 2013-06-29 DIAGNOSIS — I10 Essential (primary) hypertension: Secondary | ICD-10-CM | POA: Insufficient documentation

## 2013-06-29 DIAGNOSIS — S0083XA Contusion of other part of head, initial encounter: Principal | ICD-10-CM | POA: Insufficient documentation

## 2013-06-29 DIAGNOSIS — Z9889 Other specified postprocedural states: Secondary | ICD-10-CM | POA: Insufficient documentation

## 2013-06-29 DIAGNOSIS — F17200 Nicotine dependence, unspecified, uncomplicated: Secondary | ICD-10-CM | POA: Insufficient documentation

## 2013-06-29 DIAGNOSIS — S0003XA Contusion of scalp, initial encounter: Secondary | ICD-10-CM | POA: Insufficient documentation

## 2013-06-29 DIAGNOSIS — S0990XA Unspecified injury of head, initial encounter: Secondary | ICD-10-CM | POA: Diagnosis present

## 2013-06-29 DIAGNOSIS — Z79899 Other long term (current) drug therapy: Secondary | ICD-10-CM | POA: Diagnosis not present

## 2013-06-29 DIAGNOSIS — W010XXA Fall on same level from slipping, tripping and stumbling without subsequent striking against object, initial encounter: Secondary | ICD-10-CM | POA: Insufficient documentation

## 2013-06-29 DIAGNOSIS — IMO0002 Reserved for concepts with insufficient information to code with codable children: Secondary | ICD-10-CM | POA: Insufficient documentation

## 2013-06-29 DIAGNOSIS — G4700 Insomnia, unspecified: Secondary | ICD-10-CM | POA: Diagnosis not present

## 2013-06-29 DIAGNOSIS — H05239 Hemorrhage of unspecified orbit: Secondary | ICD-10-CM

## 2013-06-29 DIAGNOSIS — I2510 Atherosclerotic heart disease of native coronary artery without angina pectoris: Secondary | ICD-10-CM | POA: Insufficient documentation

## 2013-06-29 MED ORDER — TETANUS-DIPHTHERIA TOXOIDS TD 5-2 LFU IM INJ: 0.5000 mL | INJECTION | Freq: Once | INTRAMUSCULAR | Status: DC

## 2013-06-29 NOTE — ED Notes (Signed)
Pt states she fell while walking tonight and hit her right eye.  Pt states she has been falling about one every two weeks since her heart surgery.  Pt sates she can not open here eye at this time due to the swelling and pain

## 2013-06-29 NOTE — ED Notes (Signed)
Patient fell and hit dresser and now right eye is swollen shut.  Unknown if she passed out.  Patient does have history of syncopal episodes.  Patient denies any CP or shortness of breath.

## 2013-06-30 ENCOUNTER — Emergency Department (HOSPITAL_COMMUNITY): Payer: Medicare Other

## 2013-06-30 DIAGNOSIS — S0003XA Contusion of scalp, initial encounter: Secondary | ICD-10-CM | POA: Diagnosis not present

## 2013-06-30 DIAGNOSIS — S1093XA Contusion of unspecified part of neck, initial encounter: Secondary | ICD-10-CM | POA: Diagnosis not present

## 2013-06-30 LAB — CBC
HCT: 36.2 % (ref 36.0–46.0)
Hemoglobin: 12.4 g/dL (ref 12.0–15.0)
MCH: 33.2 pg (ref 26.0–34.0)
MCHC: 34.3 g/dL (ref 30.0–36.0)
MCV: 96.8 fL (ref 78.0–100.0)
PLATELETS: 207 10*3/uL (ref 150–400)
RBC: 3.74 MIL/uL — AB (ref 3.87–5.11)
RDW: 14.8 % (ref 11.5–15.5)
WBC: 7.3 10*3/uL (ref 4.0–10.5)

## 2013-06-30 LAB — BASIC METABOLIC PANEL
BUN: 11 mg/dL (ref 6–23)
CHLORIDE: 99 meq/L (ref 96–112)
CO2: 21 mEq/L (ref 19–32)
Calcium: 9.1 mg/dL (ref 8.4–10.5)
Creatinine, Ser: 0.9 mg/dL (ref 0.50–1.10)
GFR calc non Af Amer: 66 mL/min — ABNORMAL LOW (ref >90)
GFR, EST AFRICAN AMERICAN: 76 mL/min — AB (ref >90)
Glucose, Bld: 88 mg/dL (ref 70–99)
POTASSIUM: 4.2 meq/L (ref 3.7–5.3)
SODIUM: 134 meq/L — AB (ref 137–147)

## 2013-06-30 MED ORDER — TRAMADOL HCL 50 MG PO TABS: 50.0000 mg | ORAL_TABLET | Freq: Four times a day (QID) | ORAL | Status: DC | PRN

## 2013-06-30 NOTE — ED Notes (Signed)
Patient to CT.

## 2013-06-30 NOTE — ED Provider Notes (Signed)
CSN: [id number]     Arrival date & time 06/29/13  2320 History   First MD Initiated Contact with Patient 06/29/13 2348     Chief Complaint  Patient presents with  . Fall     (Consider location/radiation/quality/duration/timing/severity/associated sxs/prior Treatment) HPI Patient is a 63 year old woman with multiple chronic medical problems he is taking Plavix, among other medications.  The patient tripped and fell today. She says her legs became weak bilaterally and she says this caused her to trip, losing balance and fall. She says this happens fairly frequently. She hit the right side of her head on a cabinet but denies LOC. She has a large right periorbital  Hematoma and has some aching pain in this region. No occular pain. Pain is nonradiating. No headache.   Patient denies neck, back, chest, abdominal and extremity pain.   Past Medical History  Diagnosis Date  . Hypertension     a. Hypotension noted 05/2012.  Marland Kitchen Anxiety   . Depression   . Arthritis   . Irritable bowel syndrome (IBS)   . Diverticulitis   . Insomnia   . Eczema   . Gout   . GI bleed     Possible mild GI bleed 04/2011, hemoccult negative at [facility] and no colonoscopy/EGD felt necessary by GI at that time  . Ischemic cardiomyopathy     IMPROVED. a. EF 40-45% by echo at [facility] 04/2011. b. Improved to 55% by echo 05/2012.  Marland Kitchen CAD (coronary artery disease)     a. s/p two overlapping BMS to LAD 05/12/11, Occluded mid LCX with collaterals. b. NSTEMI 05/2012: PTCA/DES to mid Cx and PTCA/DES to prox-mid LAD 05/18/12.  Marland Kitchen Hyperlipidemia   . AAA (abdominal aortic aneurysm) 05/19/2012    a. Infrarenal AAA by CT at [facility] 3.4cm 05/2012.  . Tobacco abuse   . Hepatic lesion     a. Right hepatic lobe density of uncertain significance by CT at [facility] 05/2012 - needs f/u PCP.  Marland Kitchen Acute renal insufficiency     05/2012 - Cr 1.7. Improved.  . Tobacco use disorder 05/25/2012  . GERD (gastroesophageal reflux disease) 01/18/2013    Past Surgical History  Procedure Laterality Date  . Abdominal hysterectomy    . Cardiac catheterization    . Coronary angioplasty  05/2011    2 BMS to mid LAD  . Colonoscopy      about 10 years, Dr. Hinton Lovely  . Colonoscopy N/A 02/15/2013    Procedure: COLONOSCOPY;  Surgeon: Daneil Dolin, MD;  Location: [facility];  Service: Endoscopy;  Laterality: N/A;  2:15-moved to [place] notified pt  . Esophagogastroduodenoscopy (egd) with esophageal dilation N/A 02/15/2013    Procedure: ESOPHAGOGASTRODUODENOSCOPY (EGD) WITH ESOPHAGEAL DILATION;  Surgeon: Daneil Dolin, MD;  Location: [facility];  Service: Endoscopy;  Laterality: N/A;   Family History  Problem Relation Age of Onset  . Colon cancer Neg Hx   . Lung cancer Brother     recurrent X 4   History  Substance Use Topics  . Smoking status: Current Some Day Smoker -- 50 years    Types: Cigarettes  . Smokeless tobacco: Never Used     Comment: smokes maybe 1-2 per week   . Alcohol Use: 1.2 oz/week    2 Glasses of wine per week     Comment: on occasion   OB History   Grav Para Term Preterm Abortions TAB SAB Ect Mult Living  Review of Systems Ten point review of symptoms performed and is negative with the exception of symptoms noted above.     Allergies  Penicillins; Percocet; and Percodan  Home Medications   Prior to Admission medications   Medication Sig Start Date End Date Taking? Authorizing Provider  aspirin 81 MG tablet Take 81 mg by mouth daily.   Yes Historical Provider, MD  clopidogrel (PLAVIX) 75 MG tablet Take 1 tablet (75 mg total) by mouth daily. 06/18/13  Yes Arnoldo Lenis, MD  FLUoxetine (PROZAC) 20 MG capsule Take 20 mg by mouth daily.   Yes Historical Provider, MD  Fluticasone-Salmeterol (ADVAIR DISKUS) 100-50 MCG/DOSE AEPB Inhale 2 puffs into the lungs every 12 (twelve) hours.   Yes Historical Provider, MD  furosemide (LASIX) 20 MG tablet Take 20 mg by mouth daily as needed  for fluid or edema.    Yes Historical Provider, MD  isosorbide mononitrate (IMDUR) 60 MG 24 hr tablet Take 1 tablet (60 mg total) by mouth daily. 06/04/13  Yes Arnoldo Lenis, MD  LORazepam (ATIVAN) 1 MG tablet Take 1 tablet (1 mg total) by mouth every 6 (six) hours as needed for anxiety. 05/14/11  Yes Dayna N Dunn, PA-C  metoprolol (LOPRESSOR) 50 MG tablet TAKE 1 TABLET BY MOUTH TWICE DAILY 05/30/13  Yes Arnoldo Lenis, MD  pantoprazole (PROTONIX) 40 MG tablet Take 1 tablet (40 mg total) by mouth daily before breakfast. 01/19/13  Yes Mahala Menghini, PA-C  polyethylene glycol (MIRALAX / GLYCOLAX) packet Take 17 g by mouth daily as needed for mild constipation.    Yes Historical Provider, MD  pravastatin (PRAVACHOL) 80 MG tablet TAKE 1 TABLET BY MOUTH DAILY 06/11/13  Yes Arnoldo Lenis, MD  nitroGLYCERIN (NITROSTAT) 0.4 MG SL tablet Place 0.4 mg under the tongue every 5 (five) minutes as needed for chest pain. For chest pain    Historical Provider, MD   There were no vitals taken for this visit. Physical Exam Gen: well developed and well nourished appearing Head: NCAT Eyes: PERL, EOMI, large right periorbital hematoma extending to temporal region.  Nose: no epistaixis or rhinorrhea Mouth/throat: mucosa is moist and pink Neck: supple, no stridor, no midline ttp. Chest wall: no ttp Lungs: CTA B, no wheezing, rhonchi or rales CV: regular rate and rythm, good distal pulses.  Abd: soft, notender, nondistended Back: no ttp, no cva ttp Skin: warm and dry Ext: no edema, no ttp, able to range all 4 ext without pain,  normal to inspection Neuro: CN ii-xii grossly intact, no focal deficits Psyche; normal affect,  calm and cooperative.  ED Course  Procedures (including critical care time) Labs Review   Results for orders placed during the hospital encounter of 06/29/13 (from the past 24 hour(s))  CBC     Status: Abnormal   Collection Time    06/30/13 12:20 AM      Result Value Ref Range    WBC 7.3  4.0 - 10.5 K/uL   RBC 3.74 (*) 3.87 - 5.11 MIL/uL   Hemoglobin 12.4  12.0 - 15.0 g/dL   HCT 36.2  36.0 - 46.0 %   MCV 96.8  78.0 - 100.0 fL   MCH 33.2  26.0 - 34.0 pg   MCHC 34.3  30.0 - 36.0 g/dL   RDW 14.8  11.5 - 15.5 %   Platelets 207  150 - 400 K/uL  BASIC METABOLIC PANEL     Status: Abnormal   Collection Time  06/30/13 12:20 AM      Result Value Ref Range   Sodium 134 (*) 137 - 147 mEq/L   Potassium 4.2  3.7 - 5.3 mEq/L   Chloride 99  96 - 112 mEq/L   CO2 21  19 - 32 mEq/L   Glucose, Bld 88  70 - 99 mg/dL   BUN 11  6 - 23 mg/dL   Creatinine, Ser 0.90  0.50 - 1.10 mg/dL   Calcium 9.1  8.4 - 10.5 mg/dL   GFR calc non Af Amer 66 (*) >90 mL/min   GFR calc Af Amer 76 (*) >90 mL/min    Imaging Review Dg Chest 2 View  06/30/2013   CLINICAL DATA:  Fall.  EXAM: CHEST  2 VIEW  COMPARISON:  01/14/2013  FINDINGS: Chronic density noted in the lingula/left base compatible with scarring. Heart is borderline in size. Right lung is clear. No effusions. No acute opacities. No acute bony abnormality. No evidence of rib fracture or pneumothorax.  IMPRESSION: Chronic lingular scarring.  No acute cardiopulmonary disease.   Electronically Signed   By: Rolm Baptise M.D.   On: 06/30/2013 01:18   Ct Head Wo Contrast  06/30/2013   CLINICAL DATA:  Fall  EXAM: CT HEAD WITHOUT CONTRAST  TECHNIQUE: Contiguous axial images were obtained from the base of the skull through the vertex without intravenous contrast.  COMPARISON:  Prior CT from 01/14/2013.  FINDINGS: CT HEAD:  Mild generalized cerebral atrophy with chronic microvascular ischemic disease is present. There is no acute intracranial hemorrhage or infarct. No mass lesion or midline shift. Gray-white matter differentiation is well maintained. Ventricles are normal in size without evidence of hydrocephalus. CSF containing spaces are within normal limits. No extra-axial fluid collection.  The calvarium is intact.  Orbital soft tissues are within  normal limits.  No mastoid effusion.  Right frontal scalp contusion and swelling is present.  CT ORBITS:  Large right periorbital soft tissue contusion present, extending cephalad into the right frontal/temporal scalp. The right globe itself is intact. No retro-orbital hematoma. The bony orbits are intact without evidence of orbital floor fracture. No acute maxillofacial fracture identified. Left globe in bony left orbit are within normal limits.  The paranasal sinuses are clear.  IMPRESSION: CT HEAD:  1. No acute intracranial process. 2. Right frontal/temporal scalp contusion. 3. Mild generalized cerebral atrophy with chronic microvascular ischemic disease. CT ORBITS:  1. Right periorbital contusion. Intact right globe with no retro-orbital pathology. 2. No acute maxillofacial fracture.   Electronically Signed   By: Jeannine Boga M.D.   On: 06/30/2013 01:31   Ct Orbitss W/o Cm  06/30/2013   CLINICAL DATA:  Fall  EXAM: CT HEAD WITHOUT CONTRAST  TECHNIQUE: Contiguous axial images were obtained from the base of the skull through the vertex without intravenous contrast.  COMPARISON:  Prior CT from 01/14/2013.  FINDINGS: CT HEAD:  Mild generalized cerebral atrophy with chronic microvascular ischemic disease is present. There is no acute intracranial hemorrhage or infarct. No mass lesion or midline shift. Gray-white matter differentiation is well maintained. Ventricles are normal in size without evidence of hydrocephalus. CSF containing spaces are within normal limits. No extra-axial fluid collection.  The calvarium is intact.  Orbital soft tissues are within normal limits.  No mastoid effusion.  Right frontal scalp contusion and swelling is present.  CT ORBITS:  Large right periorbital soft tissue contusion present, extending cephalad into the right frontal/temporal scalp. The right globe itself is intact. No retro-orbital hematoma.  The bony orbits are intact without evidence of orbital floor fracture. No  acute maxillofacial fracture identified. Left globe in bony left orbit are within normal limits.  The paranasal sinuses are clear.  IMPRESSION: CT HEAD:  1. No acute intracranial process. 2. Right frontal/temporal scalp contusion. 3. Mild generalized cerebral atrophy with chronic microvascular ischemic disease. CT ORBITS:  1. Right periorbital contusion. Intact right globe with no retro-orbital pathology. 2. No acute maxillofacial fracture.   Electronically Signed   By: Jeannine Boga M.D.   On: 06/30/2013 01:31    EKG: nsr, no acute ischemic changes, normal intervals, normal axis, normal qrs complex  MDM   DDX: ICH, orbital fracture, periorbital hematoma.   We have ruled out ICH and orbital fracture.   Patient is stable for discharge with plan for ice to the affected region and tramadol for pain. OUtpatient f/u with PCP on Monday.     Elyn Peers, MD 06/30/13 [phone]

## 2013-07-04 ENCOUNTER — Telehealth: Payer: Self-pay | Admitting: Cardiology

## 2013-07-04 MED ORDER — PRAVASTATIN SODIUM 80 MG PO TABS: ORAL_TABLET | ORAL | Status: DC

## 2013-07-04 NOTE — Telephone Encounter (Signed)
Received fax from pt insurance requesting that medication pravastatin be changed from a 30 day supply to a 90 day supply. Called pt and verified that it is ok to change medication to a 90 day supply. Pt verbalized that this would be fine. Sent to qty to [facility].

## 2013-10-11 ENCOUNTER — Encounter: Payer: Self-pay | Admitting: Internal Medicine

## 2013-10-11 ENCOUNTER — Telehealth: Payer: Self-pay | Admitting: Internal Medicine

## 2013-10-11 NOTE — Telephone Encounter (Signed)
Pt is on the July recall to have MRI ABD with CONTRAST in 6 months

## 2013-10-11 NOTE — Telephone Encounter (Signed)
Letter has been mailed to the patient 

## 2013-11-07 ENCOUNTER — Ambulatory Visit (INDEPENDENT_AMBULATORY_CARE_PROVIDER_SITE_OTHER): Payer: Medicare Other | Admitting: Cardiology

## 2013-11-07 ENCOUNTER — Encounter: Payer: Self-pay | Admitting: Cardiology

## 2013-11-07 VITALS — BP 146/84 | HR 63 | Ht 64.0 in | Wt 152.8 lb

## 2013-11-07 DIAGNOSIS — M79609 Pain in unspecified limb: Secondary | ICD-10-CM

## 2013-11-07 DIAGNOSIS — M79604 Pain in right leg: Secondary | ICD-10-CM

## 2013-11-07 DIAGNOSIS — I2510 Atherosclerotic heart disease of native coronary artery without angina pectoris: Secondary | ICD-10-CM

## 2013-11-07 DIAGNOSIS — M79605 Pain in left leg: Principal | ICD-10-CM

## 2013-11-07 NOTE — Patient Instructions (Signed)
Your physician has requested that you have an ankle brachial index (ABI). During this test an ultrasound and blood pressure cuff are used to evaluate the arteries that supply the arms and legs with blood. Allow thirty minutes for this exam. There are no restrictions or special instructions. Office will contact with results via phone or letter.   Continue all current medications. Follow up in  3-4 weeks

## 2013-11-07 NOTE — Progress Notes (Signed)
Clinical Summary Ms. Scheiderer is a 63 y.o.female seen today for a focused visit on leg pain. For more detailed information regarding her multiple medica problems refer to my prior clinic notes.   1. Leg pain - started approx 1 month - feeling front and back of leg, burning pins and needles like feeling. Feeling from upper thighs down to both ankles Occurs with walking and standing. - both side affected equally. Does not occur with laying down or sitting.  - no sores on feet.    Past Medical History  Diagnosis Date  . Hypertension     a. Hypotension noted 05/2012.  Marland Kitchen Anxiety   . Depression   . Arthritis   . Irritable bowel syndrome (IBS)   . Diverticulitis   . Insomnia   . Eczema   . Gout   . GI bleed     Possible mild GI bleed 04/2011, hemoccult negative at [facility] and no colonoscopy/EGD felt necessary by GI at that time  . Ischemic cardiomyopathy     IMPROVED. a. EF 40-45% by echo at [facility] 04/2011. b. Improved to 55% by echo 05/2012.  Marland Kitchen CAD (coronary artery disease)     a. s/p two overlapping BMS to LAD 05/12/11, Occluded mid LCX with collaterals. b. NSTEMI 05/2012: PTCA/DES to mid Cx and PTCA/DES to prox-mid LAD 05/18/12.  Marland Kitchen Hyperlipidemia   . AAA (abdominal aortic aneurysm) 05/19/2012    a. Infrarenal AAA by CT at [facility] 3.4cm 05/2012.  . Tobacco abuse   . Hepatic lesion     a. Right hepatic lobe density of uncertain significance by CT at [facility] 05/2012 - needs f/u PCP.  Marland Kitchen Acute renal insufficiency     05/2012 - Cr 1.7. Improved.  . Tobacco use disorder 05/25/2012  . GERD (gastroesophageal reflux disease) 01/18/2013     Allergies  Allergen Reactions  . Penicillins Swelling    Per patient report, she has facial and tongue swellling  . Percocet [Oxycodone-Acetaminophen] Other (See Comments)    Hallucinations  . Percodan [Oxycodone-Aspirin] Other (See Comments)    Hallucinations     Current Outpatient Prescriptions  Medication Sig Dispense Refill  .  aspirin 81 MG tablet Take 81 mg by mouth daily.      . clopidogrel (PLAVIX) 75 MG tablet Take 1 tablet (75 mg total) by mouth daily.  30 tablet  11  . FLUoxetine (PROZAC) 20 MG capsule Take 20 mg by mouth daily.      . Fluticasone-Salmeterol (ADVAIR DISKUS) 100-50 MCG/DOSE AEPB Inhale 2 puffs into the lungs every 12 (twelve) hours.      . furosemide (LASIX) 20 MG tablet Take 20 mg by mouth daily as needed for fluid or edema.       . isosorbide mononitrate (IMDUR) 60 MG 24 hr tablet Take 1 tablet (60 mg total) by mouth daily.  30 tablet  6  . LORazepam (ATIVAN) 1 MG tablet Take 1 tablet (1 mg total) by mouth every 6 (six) hours as needed for anxiety.  30 tablet  0  . metoprolol (LOPRESSOR) 50 MG tablet TAKE 1 TABLET BY MOUTH TWICE DAILY  60 tablet  6  . nitroGLYCERIN (NITROSTAT) 0.4 MG SL tablet Place 0.4 mg under the tongue every 5 (five) minutes as needed for chest pain. For chest pain      . pantoprazole (PROTONIX) 40 MG tablet Take 1 tablet (40 mg total) by mouth daily before breakfast.  30 tablet  11  . polyethylene glycol (  MIRALAX / GLYCOLAX) packet Take 17 g by mouth daily as needed for mild constipation.       . pravastatin (PRAVACHOL) 80 MG tablet TAKE 1 TABLET BY MOUTH DAILY  90 tablet  3  . traMADol (ULTRAM) 50 MG tablet Take 1 tablet (50 mg total) by mouth every 6 (six) hours as needed.  15 tablet  0  . [DISCONTINUED] carvedilol (COREG) 3.125 MG tablet Take 1 tablet (3.125 mg total) by mouth 2 (two) times daily with a meal.  60 tablet  6  . [DISCONTINUED] citalopram (CELEXA) 20 MG tablet Take 1 tablet (20 mg total) by mouth daily.  30 tablet  1   No current facility-administered medications for this visit.     Past Surgical History  Procedure Laterality Date  . Abdominal hysterectomy    . Cardiac catheterization    . Coronary angioplasty  05/2011    2 BMS to mid LAD  . Colonoscopy      about 10 years, Dr. Hinton Lovely  . Colonoscopy N/A 02/15/2013    Procedure: COLONOSCOPY;   Surgeon: Daneil Dolin, MD;  [facility];  Service: Endoscopy;  Laterality: N/A;  2:15-moved to [place] notified pt  . Esophagogastroduodenoscopy (egd) with esophageal dilation N/A 02/15/2013    Procedure: ESOPHAGOGASTRODUODENOSCOPY (EGD) WITH ESOPHAGEAL DILATION;  Surgeon: Daneil Dolin, MD;  [facility]: [facility];  Service: Endoscopy;  Laterality: N/A;     Allergies  Allergen Reactions  . Penicillins Swelling    Per patient report, she has facial and tongue swellling  . Percocet [Oxycodone-Acetaminophen] Other (See Comments)    Hallucinations  . Percodan [Oxycodone-Aspirin] Other (See Comments)    Hallucinations      Family History  Problem Relation Age of Onset  . Colon cancer Neg Hx   . Lung cancer Brother     recurrent X 4     Social History Ms. Ruest reports that she has been smoking Cigarettes.  She has been smoking about 0.00 packs per day for the past 50 years. She has never used smokeless tobacco. Ms. Mallery reports that she drinks about 1.2 ounces of alcohol per week.   Review of Systems CONSTITUTIONAL: No weight loss, fever, chills, weakness or fatigue.  HEENT: Eyes: No visual loss, blurred vision, double vision or yellow sclerae.No hearing loss, sneezing, congestion, runny nose or sore throat.  SKIN: No rash or itching.  CARDIOVASCULAR: no chest pain, no palpitations.  RESPIRATORY: No shortness of breath, cough or sputum.  GASTROINTESTINAL: No anorexia, nausea, vomiting or diarrhea. No abdominal pain or blood.  GENITOURINARY: No burning on urination, no polyuria NEUROLOGICAL: No headache, dizziness, syncope, paralysis, ataxia, numbness or tingling in the extremities. No change in bowel or bladder control.  MUSCULOSKELETAL: bilateral leg apin.  LYMPHATICS: No enlarged nodes. No history of splenectomy.  PSYCHIATRIC: No history of depression or anxiety.  ENDOCRINOLOGIC: No reports of sweating, cold or heat intolerance. No polyuria or  polydipsia.  Marland Kitchen   Physical Examination p 63 bp 146/84 Wt 152 lbs BMI 26 Gen: resting comfortably, no acute distress HEENT: no scleral icterus, pupils equal round and reactive, no palptable cervical adenopathy,  CV:RRR, no m/r/g, no JVD. 2+ bilateral DP pulses. PT pulses bilaterally not palpable Resp: Clear to auscultation bilaterally GI: abdomen is soft, non-tender, non-distended, normal bowel sounds, no hepatosplenomegaly MSK: extremities are warm, no edema.  Skin: warm, no rash Neuro:  no focal deficits Psych: appropriate affect   Diagnostic Studies 05/2012 Cath  Hemodynamic Findings:  Central aortic pressure: 127/66  Left ventricular pressure: 140/18/23  Angiographic Findings:  Left main: Short segment with mild plaque.  Left Anterior Descending Artery: Large caliber vessel that courses to the apex. There are patent overlapping stents in the mid vessel. The proximal portion of the stented segment has severe restenosis. The remainder of the LAD is free of significant disease. There is a moderate caliber diagonal branch with ostial 20% stenosis.  Circumflex Artery: Moderate caliber vessel with moderate sized first Obtuse marginal branch with no stenosis. Just beyond the first OM, there is a 99% sub-total occlusion. The second OM branch is moderate in caliber supplying a large portion of myocardium with TIMI-1 flow. The AV groove Circumflex beyond the second OM branch is patent.  Right Coronary Artery:Large, dominant vessel with diffuse 30-40% stenosis in the mid vessel. The distal vessel has diffuse 20% stenosis. The PDA and posterolateral are moderate in caliber and no obstructive disease.  Left Ventricular Angiogram: LVEF=65%.  Impression:  1. Double vessel CAD with severe stenosis mid Circumflex and in the stented segment of the mid LAD  2. Successful PTCA/DES x 1 mid Circumflex  3. Successful PTCA/DS x 1 proximal to mid LAD  4. Normal LV systolic function.    05/2012 Echo  LVEF  55%, moderate LVH   11/2012 Abdominal US  Maximal dilatation of abdominal aorta 3.7 cm, moderate atherosclerosis. Mild thrombus built up in distal aorta.    06/2013 Cath Hemodynamic Findings:  Central aortic pressure: 136/60  Left ventricular pressure: 143/10/18  Angiographic Findings:  Left main: Short segment with mild plaque.  Left Anterior Descending Artery: Large caliber vessel that courses to the apex. There are patent overlapping stents in the mid vessel without significant restenosis. The remainder of the LAD is free of significant disease. There is a focal 40% stenosis in the distal vessel. There are two moderate caliber diagonal branches. The first diagonal branch has ostial 40% stenosis. The second diagonal branch has ostial 20% stenosis.  Circumflex Artery: Moderate caliber vessel with very small caliber first obtuse marginal branch with no stenosis. There is a patent stent in the mid AV groove Circumflex with 30% restenosis in the distal portion of the stent. The second OM branch is moderate in caliber with diffuse proximal 20% stenosis. The AV groove Circumflex beyond the second OM branch is small in caliber and patent.  Right Coronary Artery:Large, dominant vessel with diffuse 30-40% stenosis in the mid vessel. The distal vessel has diffuse 20% stenosis. The PDA and posterolateral are moderate in caliber and no obstructive disease.  Left Ventricular Angiogram: LVEF=65-70%.  Impression:  1. Stable double vessel CAD with patent Circumflex and LAD stents  2. Normal LV systolic function  Recommendations: Continue medical management of CAD. Tobacco cessation.  Complications: None. The patient tolerated the procedure well.      Assessment and Plan  1. Leg pain - symptoms are not classic for claudication, however they are primarily exertional and she has several PAD risk factors including smoking and known CAD. Normal bilateral DP pulses however diminished posterior tibial - will  obtain ABIs   F/u 3-4 weeks      Arnoldo Lenis, M.D., F.A.C.C.

## 2013-11-12 ENCOUNTER — Other Ambulatory Visit (HOSPITAL_COMMUNITY): Payer: Self-pay | Admitting: Cardiology

## 2013-11-12 DIAGNOSIS — I739 Peripheral vascular disease, unspecified: Secondary | ICD-10-CM

## 2013-11-15 ENCOUNTER — Encounter (INDEPENDENT_AMBULATORY_CARE_PROVIDER_SITE_OTHER): Payer: Medicare Other

## 2013-11-15 DIAGNOSIS — I739 Peripheral vascular disease, unspecified: Secondary | ICD-10-CM | POA: Diagnosis not present

## 2013-11-18 ENCOUNTER — Inpatient Hospital Stay (HOSPITAL_COMMUNITY)
Admission: EM | Admit: 2013-11-18 | Discharge: 2013-12-13 | DRG: 085 | Disposition: E | Payer: Medicare Other | Source: Other Acute Inpatient Hospital | Attending: Neurosurgery | Admitting: Neurosurgery

## 2013-11-18 ENCOUNTER — Inpatient Hospital Stay (HOSPITAL_COMMUNITY): Payer: Medicare Other

## 2013-11-18 ENCOUNTER — Encounter (HOSPITAL_COMMUNITY): Payer: Self-pay | Admitting: Radiology

## 2013-11-18 DIAGNOSIS — K219 Gastro-esophageal reflux disease without esophagitis: Secondary | ICD-10-CM | POA: Diagnosis present

## 2013-11-18 DIAGNOSIS — E871 Hypo-osmolality and hyponatremia: Secondary | ICD-10-CM | POA: Diagnosis not present

## 2013-11-18 DIAGNOSIS — E46 Unspecified protein-calorie malnutrition: Secondary | ICD-10-CM | POA: Diagnosis present

## 2013-11-18 DIAGNOSIS — I2589 Other forms of chronic ischemic heart disease: Secondary | ICD-10-CM | POA: Diagnosis present

## 2013-11-18 DIAGNOSIS — G936 Cerebral edema: Secondary | ICD-10-CM | POA: Diagnosis not present

## 2013-11-18 DIAGNOSIS — J9601 Acute respiratory failure with hypoxia: Secondary | ICD-10-CM

## 2013-11-18 DIAGNOSIS — I129 Hypertensive chronic kidney disease with stage 1 through stage 4 chronic kidney disease, or unspecified chronic kidney disease: Secondary | ICD-10-CM | POA: Diagnosis present

## 2013-11-18 DIAGNOSIS — E7800 Pure hypercholesterolemia, unspecified: Secondary | ICD-10-CM | POA: Diagnosis present

## 2013-11-18 DIAGNOSIS — S066X9A Traumatic subarachnoid hemorrhage with loss of consciousness of unspecified duration, initial encounter: Secondary | ICD-10-CM | POA: Diagnosis not present

## 2013-11-18 DIAGNOSIS — I728 Aneurysm of other specified arteries: Secondary | ICD-10-CM | POA: Diagnosis present

## 2013-11-18 DIAGNOSIS — I609 Nontraumatic subarachnoid hemorrhage, unspecified: Secondary | ICD-10-CM

## 2013-11-18 DIAGNOSIS — R7309 Other abnormal glucose: Secondary | ICD-10-CM | POA: Diagnosis not present

## 2013-11-18 DIAGNOSIS — I610 Nontraumatic intracerebral hemorrhage in hemisphere, subcortical: Secondary | ICD-10-CM

## 2013-11-18 DIAGNOSIS — I739 Peripheral vascular disease, unspecified: Secondary | ICD-10-CM | POA: Diagnosis present

## 2013-11-18 DIAGNOSIS — I63239 Cerebral infarction due to unspecified occlusion or stenosis of unspecified carotid arteries: Secondary | ICD-10-CM

## 2013-11-18 DIAGNOSIS — R1310 Dysphagia, unspecified: Secondary | ICD-10-CM | POA: Diagnosis not present

## 2013-11-18 DIAGNOSIS — G4700 Insomnia, unspecified: Secondary | ICD-10-CM | POA: Diagnosis present

## 2013-11-18 DIAGNOSIS — N189 Chronic kidney disease, unspecified: Secondary | ICD-10-CM | POA: Diagnosis present

## 2013-11-18 DIAGNOSIS — M129 Arthropathy, unspecified: Secondary | ICD-10-CM | POA: Diagnosis present

## 2013-11-18 DIAGNOSIS — G9340 Encephalopathy, unspecified: Secondary | ICD-10-CM

## 2013-11-18 DIAGNOSIS — I252 Old myocardial infarction: Secondary | ICD-10-CM

## 2013-11-18 DIAGNOSIS — F329 Major depressive disorder, single episode, unspecified: Secondary | ICD-10-CM | POA: Diagnosis present

## 2013-11-18 DIAGNOSIS — F3289 Other specified depressive episodes: Secondary | ICD-10-CM | POA: Diagnosis present

## 2013-11-18 DIAGNOSIS — Z9861 Coronary angioplasty status: Secondary | ICD-10-CM

## 2013-11-18 DIAGNOSIS — R4182 Altered mental status, unspecified: Secondary | ICD-10-CM

## 2013-11-18 DIAGNOSIS — T80211A Bloodstream infection due to central venous catheter, initial encounter: Secondary | ICD-10-CM | POA: Diagnosis not present

## 2013-11-18 DIAGNOSIS — F17200 Nicotine dependence, unspecified, uncomplicated: Secondary | ICD-10-CM | POA: Diagnosis present

## 2013-11-18 DIAGNOSIS — I619 Nontraumatic intracerebral hemorrhage, unspecified: Secondary | ICD-10-CM | POA: Diagnosis present

## 2013-11-18 DIAGNOSIS — E785 Hyperlipidemia, unspecified: Secondary | ICD-10-CM

## 2013-11-18 DIAGNOSIS — Z885 Allergy status to narcotic agent status: Secondary | ICD-10-CM

## 2013-11-18 DIAGNOSIS — R627 Adult failure to thrive: Secondary | ICD-10-CM | POA: Diagnosis present

## 2013-11-18 DIAGNOSIS — J9819 Other pulmonary collapse: Secondary | ICD-10-CM | POA: Diagnosis not present

## 2013-11-18 DIAGNOSIS — Y849 Medical procedure, unspecified as the cause of abnormal reaction of the patient, or of later complication, without mention of misadventure at the time of the procedure: Secondary | ICD-10-CM | POA: Diagnosis not present

## 2013-11-18 DIAGNOSIS — Z801 Family history of malignant neoplasm of trachea, bronchus and lung: Secondary | ICD-10-CM

## 2013-11-18 DIAGNOSIS — F411 Generalized anxiety disorder: Secondary | ICD-10-CM | POA: Diagnosis present

## 2013-11-18 DIAGNOSIS — A419 Sepsis, unspecified organism: Secondary | ICD-10-CM | POA: Diagnosis not present

## 2013-11-18 DIAGNOSIS — S066XAA Traumatic subarachnoid hemorrhage with loss of consciousness status unknown, initial encounter: Principal | ICD-10-CM | POA: Diagnosis present

## 2013-11-18 DIAGNOSIS — M109 Gout, unspecified: Secondary | ICD-10-CM | POA: Diagnosis present

## 2013-11-18 DIAGNOSIS — Z880 Allergy status to penicillin: Secondary | ICD-10-CM | POA: Diagnosis not present

## 2013-11-18 DIAGNOSIS — K589 Irritable bowel syndrome without diarrhea: Secondary | ICD-10-CM | POA: Diagnosis present

## 2013-11-18 DIAGNOSIS — E876 Hypokalemia: Secondary | ICD-10-CM | POA: Diagnosis present

## 2013-11-18 DIAGNOSIS — G40909 Epilepsy, unspecified, not intractable, without status epilepticus: Secondary | ICD-10-CM | POA: Diagnosis not present

## 2013-11-18 DIAGNOSIS — L259 Unspecified contact dermatitis, unspecified cause: Secondary | ICD-10-CM | POA: Diagnosis present

## 2013-11-18 DIAGNOSIS — J449 Chronic obstructive pulmonary disease, unspecified: Secondary | ICD-10-CM | POA: Diagnosis present

## 2013-11-18 DIAGNOSIS — Z9911 Dependence on respirator [ventilator] status: Secondary | ICD-10-CM

## 2013-11-18 DIAGNOSIS — D649 Anemia, unspecified: Secondary | ICD-10-CM | POA: Diagnosis present

## 2013-11-18 DIAGNOSIS — I714 Abdominal aortic aneurysm, without rupture, unspecified: Secondary | ICD-10-CM | POA: Diagnosis present

## 2013-11-18 DIAGNOSIS — S06369D Traumatic hemorrhage of cerebrum, unspecified, with loss of consciousness of unspecified duration, subsequent encounter: Secondary | ICD-10-CM

## 2013-11-18 DIAGNOSIS — I5022 Chronic systolic (congestive) heart failure: Secondary | ICD-10-CM | POA: Diagnosis present

## 2013-11-18 DIAGNOSIS — G8190 Hemiplegia, unspecified affecting unspecified side: Secondary | ICD-10-CM | POA: Diagnosis present

## 2013-11-18 DIAGNOSIS — R933 Abnormal findings on diagnostic imaging of other parts of digestive tract: Secondary | ICD-10-CM

## 2013-11-18 DIAGNOSIS — Z7902 Long term (current) use of antithrombotics/antiplatelets: Secondary | ICD-10-CM | POA: Diagnosis not present

## 2013-11-18 DIAGNOSIS — J4489 Other specified chronic obstructive pulmonary disease: Secondary | ICD-10-CM | POA: Diagnosis present

## 2013-11-18 DIAGNOSIS — F10239 Alcohol dependence with withdrawal, unspecified: Secondary | ICD-10-CM

## 2013-11-18 DIAGNOSIS — Z515 Encounter for palliative care: Secondary | ICD-10-CM

## 2013-11-18 DIAGNOSIS — I67848 Other cerebrovascular vasospasm and vasoconstriction: Secondary | ICD-10-CM

## 2013-11-18 DIAGNOSIS — Z66 Do not resuscitate: Secondary | ICD-10-CM | POA: Diagnosis present

## 2013-11-18 DIAGNOSIS — R4020 Unspecified coma: Secondary | ICD-10-CM | POA: Diagnosis not present

## 2013-11-18 DIAGNOSIS — W19XXXA Unspecified fall, initial encounter: Secondary | ICD-10-CM | POA: Diagnosis present

## 2013-11-18 DIAGNOSIS — J9600 Acute respiratory failure, unspecified whether with hypoxia or hypercapnia: Secondary | ICD-10-CM | POA: Diagnosis present

## 2013-11-18 DIAGNOSIS — F10939 Alcohol use, unspecified with withdrawal, unspecified: Secondary | ICD-10-CM

## 2013-11-18 DIAGNOSIS — F10229 Alcohol dependence with intoxication, unspecified: Secondary | ICD-10-CM | POA: Diagnosis present

## 2013-11-18 DIAGNOSIS — E873 Alkalosis: Secondary | ICD-10-CM | POA: Diagnosis not present

## 2013-11-18 DIAGNOSIS — Z6828 Body mass index (BMI) 28.0-28.9, adult: Secondary | ICD-10-CM

## 2013-11-18 DIAGNOSIS — Z7982 Long term (current) use of aspirin: Secondary | ICD-10-CM | POA: Diagnosis not present

## 2013-11-18 DIAGNOSIS — I6350 Cerebral infarction due to unspecified occlusion or stenosis of unspecified cerebral artery: Secondary | ICD-10-CM

## 2013-11-18 DIAGNOSIS — R403 Persistent vegetative state: Secondary | ICD-10-CM

## 2013-11-18 DIAGNOSIS — R51 Headache: Secondary | ICD-10-CM | POA: Diagnosis present

## 2013-11-18 DIAGNOSIS — R569 Unspecified convulsions: Secondary | ICD-10-CM

## 2013-11-18 DIAGNOSIS — I509 Heart failure, unspecified: Secondary | ICD-10-CM | POA: Diagnosis present

## 2013-11-18 DIAGNOSIS — I2510 Atherosclerotic heart disease of native coronary artery without angina pectoris: Secondary | ICD-10-CM | POA: Diagnosis present

## 2013-11-18 DIAGNOSIS — IMO0002 Reserved for concepts with insufficient information to code with codable children: Secondary | ICD-10-CM | POA: Diagnosis not present

## 2013-11-18 HISTORY — DX: Unspecified convulsions: R56.9

## 2013-11-18 LAB — BLOOD GAS, ARTERIAL
Acid-base deficit: 1.7 mmol/L (ref 0.0–2.0)
BICARBONATE: 22 meq/L (ref 20.0–24.0)
Drawn by: 25203
FIO2: 0.6 %
LHR: 14 {breaths}/min
O2 SAT: 99.5 %
PATIENT TEMPERATURE: 98.6
PEEP: 5 cmH2O
PH ART: 7.425 (ref 7.350–7.450)
TCO2: 23.1 mmol/L (ref 0–100)
VT: 500 mL
pCO2 arterial: 34.2 mmHg — ABNORMAL LOW (ref 35.0–45.0)
pO2, Arterial: 216 mmHg — ABNORMAL HIGH (ref 80.0–100.0)

## 2013-11-18 LAB — MAGNESIUM: MAGNESIUM: 1 mg/dL — AB (ref 1.5–2.5)

## 2013-11-18 LAB — COMPREHENSIVE METABOLIC PANEL
ALBUMIN: 3.3 g/dL — AB (ref 3.5–5.2)
ALT: 37 U/L — ABNORMAL HIGH (ref 0–35)
ANION GAP: 16 — AB (ref 5–15)
AST: 74 U/L — ABNORMAL HIGH (ref 0–37)
Alkaline Phosphatase: 122 U/L — ABNORMAL HIGH (ref 39–117)
BUN: 10 mg/dL (ref 6–23)
CO2: 23 mEq/L (ref 19–32)
CREATININE: 0.99 mg/dL (ref 0.50–1.10)
Calcium: 8.5 mg/dL (ref 8.4–10.5)
Chloride: 93 mEq/L — ABNORMAL LOW (ref 96–112)
GFR calc Af Amer: 68 mL/min — ABNORMAL LOW (ref >90)
GFR calc non Af Amer: 59 mL/min — ABNORMAL LOW (ref >90)
Glucose, Bld: 142 mg/dL — ABNORMAL HIGH (ref 70–99)
Potassium: 3.6 mEq/L — ABNORMAL LOW (ref 3.7–5.3)
Sodium: 132 mEq/L — ABNORMAL LOW (ref 137–147)
Total Bilirubin: 1.2 mg/dL (ref 0.3–1.2)
Total Protein: 6.8 g/dL (ref 6.0–8.3)

## 2013-11-18 LAB — MRSA PCR SCREENING: MRSA by PCR: NEGATIVE

## 2013-11-18 LAB — TRIGLYCERIDES: Triglycerides: 90 mg/dL (ref <150)

## 2013-11-18 LAB — PROTIME-INR
INR: 0.93 (ref 0.00–1.49)
Prothrombin Time: 12.5 seconds (ref 11.6–15.2)

## 2013-11-18 MED ORDER — PROPOFOL 10 MG/ML IV EMUL
0.0000 ug/kg/min | INTRAVENOUS | Status: DC
  Administered 2013-11-18: 40 ug/kg/min via INTRAVENOUS
  Administered 2013-11-19: 20 ug/kg/min via INTRAVENOUS
  Administered 2013-11-19: 30 ug/kg/min via INTRAVENOUS
  Administered 2013-11-20: 20 ug/kg/min via INTRAVENOUS
  Administered 2013-11-20: 9.833 ug/kg/min via INTRAVENOUS
  Filled 2013-11-18 (×5): qty 100

## 2013-11-18 MED ORDER — SIMVASTATIN 40 MG PO TABS: 40.0000 mg | ORAL_TABLET | Freq: Every day | ORAL | Status: DC

## 2013-11-18 MED ORDER — LORAZEPAM 1 MG PO TABS: 1.0000 mg | ORAL_TABLET | Freq: Four times a day (QID) | ORAL | Status: DC | PRN

## 2013-11-18 MED ORDER — NIMODIPINE 60 MG/20ML PO SOLN
60.0000 mg | ORAL | Status: DC
  Filled 2013-11-18 (×4): qty 20

## 2013-11-18 MED ORDER — LABETALOL HCL 5 MG/ML IV SOLN
10.0000 mg | INTRAVENOUS | Status: DC | PRN
  Administered 2013-11-19 (×2): 5 mg via INTRAVENOUS
  Administered 2013-11-21 – 2013-11-23 (×2): 10 mg via INTRAVENOUS
  Administered 2013-11-24 (×2): 20 mg via INTRAVENOUS
  Administered 2013-12-05: 10 mg via INTRAVENOUS
  Filled 2013-11-18 (×2): qty 4
  Filled 2013-11-18: qty 8
  Filled 2013-11-18 (×2): qty 4

## 2013-11-18 MED ORDER — PANTOPRAZOLE SODIUM 40 MG IV SOLR: 40.0000 mg | Freq: Every day | INTRAVENOUS | Status: DC

## 2013-11-18 MED ORDER — SENNOSIDES-DOCUSATE SODIUM 8.6-50 MG PO TABS
1.0000 | ORAL_TABLET | Freq: Two times a day (BID) | ORAL | Status: DC
  Administered 2013-11-19 – 2013-11-30 (×10): 1 via ORAL
  Filled 2013-11-18 (×27): qty 1

## 2013-11-18 MED ORDER — NIMODIPINE 30 MG PO CAPS
60.0000 mg | ORAL_CAPSULE | ORAL | Status: DC
  Filled 2013-11-18 (×4): qty 2

## 2013-11-18 MED ORDER — STROKE: EARLY STAGES OF RECOVERY BOOK
Freq: Once | Status: DC
  Filled 2013-11-18: qty 1

## 2013-11-18 MED ORDER — SIMVASTATIN 40 MG PO TABS
40.0000 mg | ORAL_TABLET | Freq: Every day | ORAL | Status: DC
  Administered 2013-11-19 – 2013-12-06 (×18): 40 mg
  Filled 2013-11-18 (×19): qty 1

## 2013-11-18 MED ORDER — POTASSIUM CHLORIDE IN NACL 20-0.9 MEQ/L-% IV SOLN
INTRAVENOUS | Status: DC
  Administered 2013-11-18 – 2013-11-20 (×2): via INTRAVENOUS
  Administered 2013-11-21: 150 mL/h via INTRAVENOUS
  Administered 2013-11-22 – 2013-11-23 (×4): via INTRAVENOUS
  Administered 2013-11-23 – 2013-11-24 (×2): 150 mL/h via INTRAVENOUS
  Filled 2013-11-18 (×17): qty 1000

## 2013-11-18 MED ORDER — MOMETASONE FURO-FORMOTEROL FUM 100-5 MCG/ACT IN AERO
2.0000 | INHALATION_SPRAY | Freq: Two times a day (BID) | RESPIRATORY_TRACT | Status: DC
  Filled 2013-11-18: qty 8.8

## 2013-11-18 MED ORDER — ACETAMINOPHEN 325 MG PO TABS: 650.0000 mg | ORAL_TABLET | ORAL | Status: DC | PRN

## 2013-11-18 MED ORDER — MIDAZOLAM HCL 2 MG/2ML IJ SOLN: 1.0000 mg | INTRAMUSCULAR | Status: DC | PRN

## 2013-11-18 MED ORDER — ISOSORBIDE MONONITRATE ER 60 MG PO TB24
60.0000 mg | ORAL_TABLET | Freq: Every day | ORAL | Status: DC
Start: 2013-11-18 — End: 2013-11-19
  Filled 2013-11-18 (×2): qty 1

## 2013-11-18 MED ORDER — MORPHINE SULFATE 2 MG/ML IJ SOLN
1.0000 mg | INTRAMUSCULAR | Status: DC | PRN
Start: 2013-11-18 — End: 2013-11-18

## 2013-11-18 MED ORDER — SODIUM CHLORIDE 0.9 % IV SOLN
Freq: Once | INTRAVENOUS | Status: AC
  Administered 2013-11-19: 04:00:00 via INTRAVENOUS

## 2013-11-18 MED ORDER — FUROSEMIDE 20 MG PO TABS
20.0000 mg | ORAL_TABLET | Freq: Every day | ORAL | Status: DC | PRN
  Filled 2013-11-18: qty 1

## 2013-11-18 MED ORDER — FLUOXETINE HCL 20 MG PO CAPS
20.0000 mg | ORAL_CAPSULE | Freq: Every day | ORAL | Status: DC
  Administered 2013-11-20 – 2013-11-25 (×6): 20 mg
  Filled 2013-11-18 (×7): qty 1

## 2013-11-18 MED ORDER — ALBUTEROL SULFATE (2.5 MG/3ML) 0.083% IN NEBU: 2.5000 mg | INHALATION_SOLUTION | RESPIRATORY_TRACT | Status: DC | PRN

## 2013-11-18 MED ORDER — CETYLPYRIDINIUM CHLORIDE 0.05 % MT LIQD
7.0000 mL | Freq: Four times a day (QID) | OROMUCOSAL | Status: DC
  Administered 2013-11-19 – 2013-12-07 (×74): 7 mL via OROMUCOSAL

## 2013-11-18 MED ORDER — PANTOPRAZOLE SODIUM 40 MG PO PACK
40.0000 mg | PACK | ORAL | Status: DC
  Administered 2013-11-18 – 2013-12-01 (×14): 40 mg
  Filled 2013-11-18 (×16): qty 20

## 2013-11-18 MED ORDER — FLUOXETINE HCL 20 MG PO CAPS
20.0000 mg | ORAL_CAPSULE | Freq: Every day | ORAL | Status: DC
Start: 2013-11-18 — End: 2013-11-18
  Filled 2013-11-18: qty 1

## 2013-11-18 MED ORDER — METOPROLOL TARTRATE 25 MG/10 ML ORAL SUSPENSION
50.0000 mg | Freq: Two times a day (BID) | ORAL | Status: DC
  Filled 2013-11-18 (×3): qty 20

## 2013-11-18 MED ORDER — LEVETIRACETAM IN NACL 500 MG/100ML IV SOLN
500.0000 mg | Freq: Two times a day (BID) | INTRAVENOUS | Status: DC
  Administered 2013-11-18 – 2013-11-20 (×4): 500 mg via INTRAVENOUS
  Filled 2013-11-18 (×5): qty 100

## 2013-11-18 MED ORDER — METOPROLOL TARTRATE 50 MG PO TABS
50.0000 mg | ORAL_TABLET | Freq: Two times a day (BID) | ORAL | Status: DC
  Filled 2013-11-18: qty 1

## 2013-11-18 MED ORDER — PANTOPRAZOLE SODIUM 40 MG PO TBEC: 40.0000 mg | DELAYED_RELEASE_TABLET | Freq: Every day | ORAL | Status: DC

## 2013-11-18 MED ORDER — ACETAMINOPHEN 650 MG RE SUPP: 650.0000 mg | RECTAL | Status: DC | PRN

## 2013-11-18 MED ORDER — NITROGLYCERIN 0.4 MG SL SUBL: 0.4000 mg | SUBLINGUAL_TABLET | SUBLINGUAL | Status: DC | PRN

## 2013-11-18 MED ORDER — CHLORHEXIDINE GLUCONATE 0.12 % MT SOLN
15.0000 mL | Freq: Two times a day (BID) | OROMUCOSAL | Status: DC
  Administered 2013-11-18 – 2013-12-06 (×37): 15 mL via OROMUCOSAL
  Filled 2013-11-18 (×37): qty 15

## 2013-11-18 MED ORDER — NIMODIPINE 30 MG/ML ORAL SOLUTION
60.0000 mg | ORAL | Status: DC
  Administered 2013-11-18 – 2013-11-26 (×44): 60 mg
  Filled 2013-11-18 (×53): qty 2

## 2013-11-18 MED ORDER — FENTANYL CITRATE 0.05 MG/ML IJ SOLN
50.0000 ug | INTRAMUSCULAR | Status: DC | PRN
Start: 2013-11-18 — End: 2013-11-19
  Administered 2013-11-18: 50 ug via INTRAVENOUS

## 2013-11-18 MED ORDER — IOHEXOL 350 MG/ML SOLN
50.0000 mL | Freq: Once | INTRAVENOUS | Status: AC | PRN
  Administered 2013-11-18: 50 mL via INTRAVENOUS

## 2013-11-18 NOTE — Consult Note (Signed)
PULMONARY / CRITICAL CARE MEDICINE   Name: Carla Hoover MRN: [id number] DOB: 09/02/1950    ADMISSION DATE:  11/21/2013 CONSULTATION DATE:  11/18/2013  REFERRING MD :  Erline Levine  CHIEF COMPLAINT:  Weakness  INITIAL PRESENTATION:  63 yo female fell and hit her head one day prior to admit.  Presented to [place] with Lt sided weakness and facial drop.  CT head showed ICH with SAH.  She required intubation prior to transfer to [facility].  PCCM consulted for vent/medical management.  STUDIES:  9/06 CT head ([place])  SIGNIFICANT EVENTS: 9/06 Transfer from [facility] to [place]:   63 yo female smoker presented to [facility] with Lt sided weakness and facial drop after falling one day prior.  She has hx of CAD s/p DES on plavix and ASA, COPD.  CT showed ICH with SAH.  She was transferred to [facility] for neurosurgical evaluation.  She was intubated prior to transfer.  Of note, pt's family reports that she drinks at least 1 gallon on hard liquor per week.  PAST MEDICAL HISTORY :  Past Medical History  Diagnosis Date  . Hypertension     a. Hypotension noted 05/2012.  Marland Kitchen Anxiety   . Depression   . Arthritis   . Irritable bowel syndrome (IBS)   . Diverticulitis   . Insomnia   . Eczema   . Gout   . GI bleed     Possible mild GI bleed 04/2011, hemoccult negative at [facility] and no colonoscopy/EGD felt necessary by GI at that time  . Ischemic cardiomyopathy     IMPROVED. a. EF 40-45% by echo at [facility] 04/2011. b. Improved to 55% by echo 05/2012.  Marland Kitchen CAD (coronary artery disease)     a. s/p two overlapping BMS to LAD 05/12/11, Occluded mid LCX with collaterals. b. NSTEMI 05/2012: PTCA/DES to mid Cx and PTCA/DES to prox-mid LAD 05/18/12.  Marland Kitchen Hyperlipidemia   . AAA (abdominal aortic aneurysm) 05/19/2012    a. Infrarenal AAA by CT at [facility] 3.4cm 05/2012.  . Tobacco abuse   . Hepatic lesion     a. Right hepatic lobe density of uncertain significance by CT at  [facility] 05/2012 - needs f/u PCP.  Marland Kitchen Acute renal insufficiency     05/2012 - Cr 1.7. Improved.  . Tobacco use disorder 05/25/2012  . GERD (gastroesophageal reflux disease) 01/18/2013   Past Surgical History  Procedure Laterality Date  . Abdominal hysterectomy    . Cardiac catheterization    . Coronary angioplasty  05/2011    2 BMS to mid LAD  . Colonoscopy      about 10 years, Dr. Hinton Lovely  . Colonoscopy N/A 02/15/2013    Procedure: COLONOSCOPY;  Surgeon: Daneil Dolin, MD;  [facility];  Service: Endoscopy;  Laterality: N/A;  2:15-moved to [place] notified pt  . Esophagogastroduodenoscopy (egd) with esophageal dilation N/A 02/15/2013    Procedure: ESOPHAGOGASTRODUODENOSCOPY (EGD) WITH ESOPHAGEAL DILATION;  Surgeon: Daneil Dolin, MD;  [facility];  Service: Endoscopy;  Laterality: N/A;   Prior to Admission medications   Medication Sig Start Date End Date Taking? Authorizing Provider  aspirin 81 MG tablet Take 81 mg by mouth daily.    Historical Provider, MD  clopidogrel (PLAVIX) 75 MG tablet Take 1 tablet (75 mg total) by mouth daily. 06/18/13   Arnoldo Lenis, MD  FLUoxetine (PROZAC) 20 MG capsule Take 20 mg by mouth daily.  Historical Provider, MD  Fluticasone-Salmeterol (ADVAIR DISKUS) 100-50 MCG/DOSE AEPB Inhale 2 puffs into the lungs every 12 (twelve) hours.    Historical Provider, MD  furosemide (LASIX) 20 MG tablet Take 20 mg by mouth daily as needed for fluid or edema.     Historical Provider, MD  isosorbide mononitrate (IMDUR) 60 MG 24 hr tablet Take 1 tablet (60 mg total) by mouth daily. 06/04/13   Arnoldo Lenis, MD  LORazepam (ATIVAN) 1 MG tablet Take 1 tablet (1 mg total) by mouth every 6 (six) hours as needed for anxiety. 05/14/11   Dayna N Dunn, PA-C  metoprolol (LOPRESSOR) 50 MG tablet TAKE 1 TABLET BY MOUTH TWICE DAILY 05/30/13   Arnoldo Lenis, MD  nitroGLYCERIN (NITROSTAT) 0.4 MG SL tablet Place 0.4 mg under the tongue every 5 (five)  minutes as needed for chest pain. For chest pain    Historical Provider, MD  pantoprazole (PROTONIX) 40 MG tablet Take 1 tablet (40 mg total) by mouth daily before breakfast. 01/19/13   Mahala Menghini, PA-C  polyethylene glycol (MIRALAX / GLYCOLAX) packet Take 17 g by mouth daily as needed for mild constipation.     Historical Provider, MD  pravastatin (PRAVACHOL) 80 MG tablet TAKE 1 TABLET BY MOUTH DAILY 07/04/13   Arnoldo Lenis, MD   Allergies  Allergen Reactions  . Penicillins Swelling and Other (See Comments)    Per patient report, she has facial and tongue swellling  . Percocet [Oxycodone-Acetaminophen] Other (See Comments)    Hallucinations  . Percodan [Oxycodone-Aspirin] Other (See Comments)    Hallucinations    FAMILY HISTORY:  Family History  Problem Relation Age of Onset  . Colon cancer Neg Hx   . Lung cancer Brother     recurrent X 4   SOCIAL HISTORY:  reports that she has been smoking Cigarettes.  She has been smoking about 0.00 packs per day for the past 50 years. She has never used smokeless tobacco. She reports that she drinks about 1.2 ounces of alcohol per week. She reports that she does not use illicit drugs.  REVIEW OF SYSTEMS:  A review of 14 systems could not be obtained as the patient is intubated and sedated.    SUBJECTIVE:   VITAL SIGNS: Temp:  [98.7 F (37.1 C)-99.7 F (37.6 C)] 99.7 F (37.6 C) (09/07 0400) Pulse Rate:  [67-88] 83 (09/07 0400) Resp:  [14-19] 16 (09/07 0400) BP: (103-163)/(49-101) 122/61 mmHg (09/07 0400) SpO2:  [100 %] 100 % (09/07 0400) FiO2 (%):  [40 %-100 %] 40 % (09/07 0352) Weight:  [67.8 kg (149 lb 7.6 oz)] 67.8 kg (149 lb 7.6 oz) (09/06 1800) HEMODYNAMICS:   VENTILATOR SETTINGS: Vent Mode:  [-] PRVC FiO2 (%):  [40 %-100 %] 40 % Set Rate:  [14 bmp] 14 bmp Vt Set:  [500 mL] 500 mL PEEP:  [5 cmH20] 5 cmH20 Plateau Pressure:  [16 cmH20-22 cmH20] 21 cmH20 INTAKE / OUTPUT:  Intake/Output Summary (Last 24 hours) at  11/28/2013 0433 Last data filed at 12/10/2013 0400  Gross per 24 hour  Intake 670.68 ml  Output      0 ml  Net 670.68 ml    PHYSICAL EXAMINATION: General:  Laying in bed.  Intubated, sedated with propofol. Neuro:  sedated HEENT:  PERRL Cardiovascular:  NRRR, no MRG Lungs:  CTAB, no wrr Abdomen:  Soft, NTND, +BS Musculoskeletal:  No CCE Skin:  No rashes or other lesions.   LABS:  CBC  Recent Labs Lab  11/20/2013 2345 11/14/2013 0245  WBC  --  7.1  HGB  --  11.1*  HCT  --  32.0*  PLT 205 186   Coag's  Recent Labs Lab 11/15/2013 1900  INR 0.93   BMET  Recent Labs Lab 12/04/2013 1900 11/27/2013 0245  NA 132* 128*  K 3.6* 3.7  CL 93* 92*  CO2 23 20  BUN 10 11  CREATININE 0.99 0.98  GLUCOSE 142* 148*   Electrolytes  Recent Labs Lab 11/17/2013 1900 11/13/2013 0245  CALCIUM 8.5 8.0*  MG 1.0*  --    Sepsis Markers No results found for this basename: LATICACIDVEN, PROCALCITON, O2SATVEN,  in the last 168 hours ABG  Recent Labs Lab 12/05/2013 2014  PHART 7.425  PCO2ART 34.2*  PO2ART 216.0*   Liver Enzymes  Recent Labs Lab 12/05/2013 1900  AST 74*  ALT 37*  ALKPHOS 122*  BILITOT 1.2  ALBUMIN 3.3*   Cardiac Enzymes No results found for this basename: TROPONINI, PROBNP,  in the last 168 hours Glucose No results found for this basename: GLUCAP,  in the last 168 hours  Imaging Ct Angio Head W/cm &/or Wo Cm  12/09/2013   CLINICAL DATA:  Subarachnoid hemorrhage.  Evaluate for aneurysm.  EXAM: CT ANGIOGRAPHY HEAD  TECHNIQUE: Multidetector CT imaging of the head was performed using the standard protocol during bolus administration of intravenous contrast. Multiplanar CT image reconstructions and MIPs were obtained to evaluate the vascular anatomy.  CONTRAST:  43mL OMNIPAQUE IOHEXOL 350 MG/ML SOLN  COMPARISON:  Head CT 12/12/2013  FINDINGS: Extensive acute subarachnoid hemorrhage is again seen, greatest in the right sylvian fissure, right cerebral hemispheric sulci, and  along the falx with some hemorrhage in the basilar cisterns. Overall volume of hemorrhage has not significantly changed. 2.7 cm hematoma centered in the region of the right insula has also not significantly changed in size with moderate surrounding edema. 5 mm of leftward midline shift and partial effacement of the right lateral ventricle are unchanged. There is no hydrocephalus. No gross abnormal enhancement is identified. Orbits are unremarkable. Mastoid air cells and visualized paranasal sinuses are clear.  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 Visualized distal left vertebral artery is patent and supplies the basilar. Left PICA origin is patent. Right PICA appears patent and may arise from a common trunk with the right AICA. SCA origins are patent. Basilar artery is patent without stenosis. Basilar tip is slightly bulbous without discrete aneurysm. There is minimal bilateral PCA irregularity without evidence of significant stenosis. Posterior communicating arteries are not identified.  Internal carotid arteries are patent from skullbase to carotid termini. Moderate carotid siphon atherosclerotic calcification is present bilaterally with mild bilateral carotid siphon narrowing. There is moderate, diffuse narrowing of the right supraclinoid ICA. There is marked irregular narrowing of the proximal right MCA with only trace intermittent opacification of more distal right MCA branches in the area of extensive subarachnoid hemorrhage in the sylvian fissure. No opacified right MCA aneurysm is identified.  The right A1 segment is also moderately narrowed. There is also moderate irregular narrowing of the bilateral A2 and more distal ACA branch vessels. The left M1 segment is patent. There is a complex 7 x 6 mm aneurysm projecting laterally from the left MCA bifurcation. There is a 3 mm small outpouching projecting superiorly from the aneurysm. The aneurysm involves M2 trunk origins.  Review of the MIP images  confirms the above findings.  IMPRESSION: 1. Unchanged appearance of extensive, predominantly right-sided subarachnoid hemorrhage as well  as 2.7 cm hematoma in the region of the right insula. Moderate surrounding edema with 5 mm of unchanged midline shift. 2. No right MCA aneurysm is identified, however there is severe attenuation of the right MCA and its branch vessels which may reflect local mass effect and vasospasm secondary to surrounding hemorrhage, and this could obscure an underlying right MCA aneurysm. Narrowing of the ACAs and right ICA terminus also may reflect vasospasm. Catheter angiography may be helpful for further evaluation. 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  These results were called by telephone at the time of interpretation on 12/09/2013 at 9:55 pm to Dr. Erline Levine , who verbally acknowledged these results.   Electronically Signed   By: Logan Bores   On: 12/12/2013 22:07   Dg Chest 1 View  11/20/2013   CLINICAL DATA:  Acute respiratory failure.  Intubation.  EXAM: CHEST - 1 VIEW  COMPARISON:  Prior today on 11/13/2013  FINDINGS: Endotracheal tube remains in appropriate position. A new nasogastric tube is seen entering stomach.  Heart size is stable. Mild opacity in the lateral left lung base is stable. No evidence of pneumothorax or pleural effusion.  IMPRESSION: Endotracheal tube and nasogastric tube in appropriate position.  Stable mild opacity in lateral left lung base.   Electronically Signed   By: Earle Gell M.D.   On: 11/13/2013 21:18     ASSESSMENT / PLAN:  PULMONARY OETT 9/06 >> A: Acute respiratory failure in setting of ICH. Hx of COPD, tobacco abuse. P:   Full vent support F/u CXR, ABG PRN albuterol Hold outpt advair for now  CARDIOVASCULAR A:  Hx of CAD, hyperlipidemia, HTN, chronic systolic heart failure, AAA. P:  Monitor hemodynamics Hold ASA, plavix in setting of ICH Continue lasix, imdur, lopressor, pravachol for  now  RENAL A:   CKD. P:   Monitor renal fx, urine outpt, electrolytes  GASTROINTESTINAL A:   Nutrition. Hx of IBS. P:   NPO Protonix for SUP Tube feeds if unable to extubate soon  HEMATOLOGIC A:   No acute issues. P:  SCD for DVT prevention  INFECTIOUS A:   No evidence for infection. P:   Monitor clinically  ENDOCRINE A:   No acute issues.   P:   Monitor blood sugar on BMET  NEUROLOGIC A:   ICH with SAH. Hx of anxiety, depression, insomnia P:   RASS goal: -1 PAD 3 with diprivan and prn versed/fentanyl F/u CT angiogram head/neck per neurosurgery Keppra for seizure prevention per neurology Nimotop per neurosurgery Continue prozac Will need CIWA when off GABAnergic agents for sedation.   TODAY'S SUMMARY: ICH on plavix after fall.   I have personally obtained a history, examined the patient, evaluated laboratory and imaging results, formulated the assessment and plan and placed orders. CRITICAL CARE: The patient is critically ill with multiple organ systems failure and requires high complexity decision making for assessment and support, frequent evaluation and titration of therapies, application of advanced monitoring technologies and extensive interpretation of multiple databases. Critical Care Time devoted to patient care services described in this note is 35 minutes.    Pulmonary and [place] Pager: [phone]  11/17/2013, 22:30

## 2013-11-18 NOTE — H&P (Addendum)
Carla Hoover is an 63 y.o. female.   Chief Complaint: Intracerebral hemorrhage HPI: Patient reportedly fell and hit her head last night.  She is on plavix for CAD.  She was brought to [facility] for left sided weakness and facial droop.  Patient c/o HA.  Head CT shows right Sylvian ICH with blood in Sylvian fissure and layering in region of right MCA.  There is SAH.  Patient was intubated at [facility] and brought to [facility] for further care.  No h/o CVA.  Does have h/o CAD, MI, CHF, Depression, elevated cholesterol, COPD.  On discussion with patient's son, he indicates that his mother is an extremely heavy drinker and has recently been drinking in excess of a gallon of hard liquor per week.  He thinks she was intoxicated last night and that this is why she fell and hit her head.  Past Medical History  Diagnosis Date  . Hypertension     a. Hypotension noted 05/2012.  Marland Kitchen Anxiety   . Depression   . Arthritis   . Irritable bowel syndrome (IBS)   . Diverticulitis   . Insomnia   . Eczema   . Gout   . GI bleed     Possible mild GI bleed 04/2011, hemoccult negative at [facility] and no colonoscopy/EGD felt necessary by GI at that time  . Ischemic cardiomyopathy     IMPROVED. a. EF 40-45% by echo at [facility] 04/2011. b. Improved to 55% by echo 05/2012.  Marland Kitchen CAD (coronary artery disease)     a. s/p two overlapping BMS to LAD 05/12/11, Occluded mid LCX with collaterals. b. NSTEMI 05/2012: PTCA/DES to mid Cx and PTCA/DES to prox-mid LAD 05/18/12.  Marland Kitchen Hyperlipidemia   . AAA (abdominal aortic aneurysm) 05/19/2012    a. Infrarenal AAA by CT at [facility] 3.4cm 05/2012.  . Tobacco abuse   . Hepatic lesion     a. Right hepatic lobe density of uncertain significance by CT at [facility] 05/2012 - needs f/u PCP.  Marland Kitchen Acute renal insufficiency     05/2012 - Cr 1.7. Improved.  . Tobacco use disorder 05/25/2012  . GERD (gastroesophageal reflux disease) 01/18/2013    Past Surgical History  Procedure Laterality Date   . Abdominal hysterectomy    . Cardiac catheterization    . Coronary angioplasty  05/2011    2 BMS to mid LAD  . Colonoscopy      about 10 years, Dr. Hinton Lovely  . Colonoscopy N/A 02/15/2013    Procedure: COLONOSCOPY;  Surgeon: Daneil Dolin, MD;  Location: [facility] ENDO [facility];  Service: Endoscopy;  Laterality: N/A;  2:15-moved to [place] notified pt  . Esophagogastroduodenoscopy (egd) with esophageal dilation N/A 02/15/2013    Procedure: ESOPHAGOGASTRODUODENOSCOPY (EGD) WITH ESOPHAGEAL DILATION;  Surgeon: Daneil Dolin, MD;  Location: [facility] ENDO SUITE;  Service: Endoscopy;  Laterality: N/A;    Family History  Problem Relation Age of Onset  . Colon cancer Neg Hx   . Lung cancer Brother     recurrent X 4   Social History:  reports that she has been smoking Cigarettes.  She has been smoking about 0.00 packs per day for the past 50 years. She has never used smokeless tobacco. She reports that she drinks about 1.2 ounces of alcohol per week. She reports that she does not use illicit drugs.  Allergies:  Allergies  Allergen Reactions  . Penicillins Swelling    Per patient report, she has facial and tongue swellling  .  Percocet [Oxycodone-Acetaminophen] Other (See Comments)    Hallucinations  . Percodan [Oxycodone-Aspirin] Other (See Comments)    Hallucinations    Medications Prior to Admission  Medication Sig Dispense Refill  . aspirin 81 MG tablet Take 81 mg by mouth daily.      . clopidogrel (PLAVIX) 75 MG tablet Take 1 tablet (75 mg total) by mouth daily.  30 tablet  11  . FLUoxetine (PROZAC) 20 MG capsule Take 20 mg by mouth daily.      . Fluticasone-Salmeterol (ADVAIR DISKUS) 100-50 MCG/DOSE AEPB Inhale 2 puffs into the lungs every 12 (twelve) hours.      . furosemide (LASIX) 20 MG tablet Take 20 mg by mouth daily as needed for fluid or edema.       . isosorbide mononitrate (IMDUR) 60 MG 24 hr tablet Take 1 tablet (60 mg total) by mouth daily.  30 tablet  6  . LORazepam (ATIVAN) 1  MG tablet Take 1 tablet (1 mg total) by mouth every 6 (six) hours as needed for anxiety.  30 tablet  0  . metoprolol (LOPRESSOR) 50 MG tablet TAKE 1 TABLET BY MOUTH TWICE DAILY  60 tablet  6  . nitroGLYCERIN (NITROSTAT) 0.4 MG SL tablet Place 0.4 mg under the tongue every 5 (five) minutes as needed for chest pain. For chest pain      . pantoprazole (PROTONIX) 40 MG tablet Take 1 tablet (40 mg total) by mouth daily before breakfast.  30 tablet  11  . polyethylene glycol (MIRALAX / GLYCOLAX) packet Take 17 g by mouth daily as needed for mild constipation.       . pravastatin (PRAVACHOL) 80 MG tablet TAKE 1 TABLET BY MOUTH DAILY  90 tablet  3    No results found for this or any previous visit (from the past 48 hour(s)). No results found.  Review of Systems  Unable to perform ROS   There were no vitals taken for this visit. Physical Exam  Constitutional: She appears well-developed and well-nourished. She appears lethargic.  HENT:  Head: Normocephalic and atraumatic.  Right Ear: External ear normal.  Left Ear: External ear normal.  Eyes: Conjunctivae are normal. Pupils are equal, round, and reactive to light.  Neck: Normal range of motion.  Cardiovascular: Normal rate and regular rhythm.   Respiratory: Effort normal and breath sounds normal.  GI: Soft. Bowel sounds are normal.  Musculoskeletal: Normal range of motion.  Neurological: She has normal strength. She appears lethargic.  Patient is intubated and sedated.  She opens eyes and tracks.  She follows commands and moves all extremities to command.     Assessment/Plan Patient admitted to [facility] Neuro ICU.  CCM consulted.  Will obtain CT Angiogram to r/o aneurysm.  Hold plavix.  Keep intubated tonight.  Repeat Head CT in AM.  Ziyon Cedotal D 11/15/2013, 6:42 PM

## 2013-11-18 NOTE — Progress Notes (Signed)
Pt's jewelry (5 rings, 1 bracelet, 1 anklet) was removed and placed in a labeled denture cup.

## 2013-11-19 ENCOUNTER — Encounter (HOSPITAL_COMMUNITY): Admission: EM | Disposition: E | Payer: Self-pay | Source: Other Acute Inpatient Hospital | Attending: Neurosurgery

## 2013-11-19 ENCOUNTER — Encounter (HOSPITAL_COMMUNITY): Payer: Self-pay | Admitting: Radiology

## 2013-11-19 ENCOUNTER — Inpatient Hospital Stay (HOSPITAL_COMMUNITY): Payer: Medicare Other

## 2013-11-19 ENCOUNTER — Inpatient Hospital Stay (HOSPITAL_COMMUNITY): Payer: Medicare Other | Admitting: Anesthesiology

## 2013-11-19 ENCOUNTER — Encounter (HOSPITAL_COMMUNITY): Payer: Medicare Other | Admitting: Anesthesiology

## 2013-11-19 HISTORY — PX: RADIOLOGY WITH ANESTHESIA: SHX6223

## 2013-11-19 LAB — CBC
HCT: 32 % — ABNORMAL LOW (ref 36.0–46.0)
Hemoglobin: 11.1 g/dL — ABNORMAL LOW (ref 12.0–15.0)
MCH: 34.8 pg — ABNORMAL HIGH (ref 26.0–34.0)
MCHC: 34.7 g/dL (ref 30.0–36.0)
MCV: 100.3 fL — ABNORMAL HIGH (ref 78.0–100.0)
Platelets: 186 10*3/uL (ref 150–400)
RBC: 3.19 MIL/uL — ABNORMAL LOW (ref 3.87–5.11)
RDW: 13.1 % (ref 11.5–15.5)
WBC: 7.1 10*3/uL (ref 4.0–10.5)

## 2013-11-19 LAB — BASIC METABOLIC PANEL
ANION GAP: 16 — AB (ref 5–15)
BUN: 11 mg/dL (ref 6–23)
CHLORIDE: 92 meq/L — AB (ref 96–112)
CO2: 20 mEq/L (ref 19–32)
CREATININE: 0.98 mg/dL (ref 0.50–1.10)
Calcium: 8 mg/dL — ABNORMAL LOW (ref 8.4–10.5)
GFR, EST AFRICAN AMERICAN: 69 mL/min — AB (ref >90)
GFR, EST NON AFRICAN AMERICAN: 59 mL/min — AB (ref >90)
Glucose, Bld: 148 mg/dL — ABNORMAL HIGH (ref 70–99)
Potassium: 3.7 mEq/L (ref 3.7–5.3)
Sodium: 128 mEq/L — ABNORMAL LOW (ref 137–147)

## 2013-11-19 LAB — PLATELET COUNT: PLATELETS: 205 10*3/uL (ref 150–400)

## 2013-11-19 LAB — PREPARE RBC (CROSSMATCH)

## 2013-11-19 LAB — ABO/RH: ABO/RH(D): A NEG

## 2013-11-19 LAB — POCT ACTIVATED CLOTTING TIME: Activated Clotting Time: 123 seconds

## 2013-11-19 SURGERY — RADIOLOGY WITH ANESTHESIA
Anesthesia: General

## 2013-11-19 MED ORDER — VANCOMYCIN HCL IN DEXTROSE 1-5 GM/200ML-% IV SOLN
1000.0000 mg | Freq: Once | INTRAVENOUS | Status: AC
Start: 2013-11-19 — End: 2013-11-19
  Administered 2013-11-19: 1000 mg via INTRAVENOUS
  Filled 2013-11-19 (×2): qty 200

## 2013-11-19 MED ORDER — LIDOCAINE HCL (PF) 1 % IJ SOLN
INTRAMUSCULAR | Status: AC
  Filled 2013-11-19: qty 5

## 2013-11-19 MED ORDER — ONDANSETRON HCL 4 MG/2ML IJ SOLN
4.0000 mg | Freq: Four times a day (QID) | INTRAMUSCULAR | Status: DC | PRN
  Administered 2013-11-24: 4 mg via INTRAVENOUS
  Filled 2013-11-19: qty 2

## 2013-11-19 MED ORDER — NITROGLYCERIN 1 MG/10 ML FOR IR/CATH LAB
INTRA_ARTERIAL | Status: AC
  Filled 2013-11-19: qty 10

## 2013-11-19 MED ORDER — LACTATED RINGERS IV SOLN
INTRAVENOUS | Status: DC | PRN
  Administered 2013-11-19: 08:00:00 via INTRAVENOUS

## 2013-11-19 MED ORDER — FENTANYL CITRATE 0.05 MG/ML IJ SOLN: 25.0000 ug | INTRAMUSCULAR | Status: DC | PRN

## 2013-11-19 MED ORDER — IOHEXOL 300 MG/ML  SOLN
150.0000 mL | Freq: Once | INTRAMUSCULAR | Status: AC | PRN
  Administered 2013-11-19: 120 mL via INTRA_ARTERIAL

## 2013-11-19 MED ORDER — SODIUM CHLORIDE 0.9 % IV SOLN: Freq: Once | INTRAVENOUS | Status: DC

## 2013-11-19 MED ORDER — VERAPAMIL HCL 2.5 MG/ML IV SOLN
INTRAVENOUS | Status: AC
  Filled 2013-11-19: qty 200

## 2013-11-19 MED ORDER — FENTANYL CITRATE 0.05 MG/ML IJ SOLN
INTRAMUSCULAR | Status: DC | PRN
  Administered 2013-11-19 (×4): 25 ug via INTRAVENOUS
  Administered 2013-11-19: 100 ug via INTRAVENOUS
  Administered 2013-11-19: 50 ug via INTRAVENOUS

## 2013-11-19 MED ORDER — MIDAZOLAM HCL 5 MG/5ML IJ SOLN
INTRAMUSCULAR | Status: DC | PRN
  Administered 2013-11-19: 2 mg via INTRAVENOUS

## 2013-11-19 MED ORDER — ROCURONIUM BROMIDE 100 MG/10ML IV SOLN
INTRAVENOUS | Status: DC | PRN
  Administered 2013-11-19: 20 mg via INTRAVENOUS
  Administered 2013-11-19: 50 mg via INTRAVENOUS
  Administered 2013-11-19 (×3): 10 mg via INTRAVENOUS

## 2013-11-19 MED ORDER — NICARDIPINE HCL IN NACL 20-0.86 MG/200ML-% IV SOLN: 5.0000 mg/h | INTRAVENOUS | Status: DC

## 2013-11-19 MED ORDER — METOPROLOL TARTRATE 25 MG/10 ML ORAL SUSPENSION
25.0000 mg | Freq: Three times a day (TID) | ORAL | Status: DC
  Administered 2013-11-19 – 2013-11-23 (×10): 25 mg
  Filled 2013-11-19 (×15): qty 10

## 2013-11-19 MED ORDER — VERAPAMIL HCL 2.5 MG/ML IV SOLN
20.0000 mg | Freq: Once | INTRAVENOUS | Status: DC
  Filled 2013-11-19: qty 8

## 2013-11-19 MED ORDER — PHENYLEPHRINE HCL 10 MG/ML IJ SOLN
0.0000 ug/min | INTRAVENOUS | Status: DC
  Administered 2013-11-19: 5 ug/min via INTRAVENOUS
  Filled 2013-11-19: qty 4

## 2013-11-19 MED ORDER — SODIUM CHLORIDE 0.9 % IV SOLN
INTRAVENOUS | Status: DC
  Administered 2013-11-19 (×2): via INTRAVENOUS

## 2013-11-19 MED ORDER — EPHEDRINE SULFATE 50 MG/ML IJ SOLN
INTRAMUSCULAR | Status: DC | PRN
  Administered 2013-11-19: 10 mg via INTRAVENOUS

## 2013-11-19 MED ORDER — VECURONIUM BROMIDE 10 MG IV SOLR
INTRAVENOUS | Status: DC | PRN
  Administered 2013-11-19: 5 mg via INTRAVENOUS

## 2013-11-19 MED ORDER — HEPARIN SODIUM (PORCINE) 1000 UNIT/ML IJ SOLN
INTRAMUSCULAR | Status: DC | PRN
  Administered 2013-11-19: 1000 [IU] via INTRAVENOUS

## 2013-11-19 MED ORDER — LORAZEPAM 2 MG/ML IJ SOLN: 0.5000 mg | INTRAMUSCULAR | Status: DC | PRN

## 2013-11-19 NOTE — Sedation Documentation (Signed)
5Fr Exoseal closure by [facility], RT

## 2013-11-19 NOTE — Sedation Documentation (Signed)
Back on bed ready for Tx to 46M

## 2013-11-19 NOTE — Procedures (Signed)
S/P 4 vessel cerebral arteriogram. RT CFA approach. Findings. 1.Approx 73mm x 6 mm Lt MCA bilobed aneurysm. 2.Severe diffuse decrease in caliber of RT MCA trifurcation branches and RT MCA M1 seg and mod of RT ACA prox most consistent with sig vasospasm. NO definitive aneurysm identified  angiographically at this time.Marland Kitchen Post IA  Infusion of 10 mg of verapamil with improvement in caliber and flow thru RT MCA branches

## 2013-11-19 NOTE — Anesthesia Postprocedure Evaluation (Signed)
  Anesthesia Post-op Note  Patient: Carla Hoover  Procedure(s) Performed: Procedure(s): RADIOLOGY WITH ANESTHESIA (N/A)  Patient Location: ICU  Anesthesia Type:General  Level of Consciousness: sedated  Airway and Oxygen Therapy: Patient remains intubated per anesthesia plan  Post-op Pain: none  Post-op Assessment: Post-op Vital signs reviewed, Patient's Cardiovascular Status Stable, Respiratory Function Stable and Patent Airway  Post-op Vital Signs: Reviewed and stable  Last Vitals:  Filed Vitals:   12/01/2013 1946  BP:   Pulse:   Temp: 36.7 C  Resp:     Complications: No apparent anesthesia complications

## 2013-11-19 NOTE — Progress Notes (Signed)
VASCULAR LAB PRELIMINARY  PRELIMINARY  PRELIMINARY  PRELIMINARY  Transcranial Doppler  Date POD PCO2 HCT BP  MCA ACA PCA OPHT SIPH VERT Basilar  11/29/2013 SB     Right  Left   40  26   --  -30   --  --   21  18   30  31    --  --     --         Right  Left                                            Right  Left                                             Right  Left                                             Right  Left                                            Right  Left                                            Right  Left                                        MCA = Middle Cerebral Artery      OPHT = Opthalmic Artery     BASILAR = Basilar Artery   ACA = Anterior Cerebral Artery     SIPH = Carotid Siphon PCA = Posterior Cerebral Artery   VERT = Verterbral Artery                   Normal MCA = 62+\-12 ACA = 50+\-12 PCA = 42+\-23         Dea Bitting, RVT 11/21/2013, 12:35 PM

## 2013-11-19 NOTE — Progress Notes (Signed)
Pt transported on vent to IR with no complications. Anesthesia working case, placed on their machine and will call when case finished.

## 2013-11-19 NOTE — Sedation Documentation (Signed)
Anesthesia case, angiogram with possible treatment of [facility]

## 2013-11-19 NOTE — Progress Notes (Signed)
Subjective: Patient reports sedated on ventilator  Objective: Vital signs in last 24 hours: Temp:  [98.7 F (37.1 C)-99.8 F (37.7 C)] 98.7 F (37.1 C) (09/07 0800) Pulse Rate:  [67-91] 91 (09/07 0737) Resp:  [14-19] 16 (09/07 0737) BP: (95-163)/(49-101) 131/61 mmHg (09/07 0737) SpO2:  [99 %-100 %] 100 % (09/07 0737) FiO2 (%):  [30 %-100 %] 30 % (09/07 0737) Weight:  [67.8 kg (149 lb 7.6 oz)] 67.8 kg (149 lb 7.6 oz) (09/06 1800)  Intake/Output from previous day: 09/06 0701 - 09/07 0700 In: 1244.5 [I.V.:844.5; Blood:300; IV Piggyback:100] Out: 450 [Urine:450] Intake/Output this shift: Total I/O In: 831.3 [I.V.:731.3; IV Piggyback:100] Out: 105 [Urine:100; Blood:5]  Physical Exam: Patient currently in angio.  Reportedly responsive and following commands.  Lab Results:  Recent Labs  11/29/2013 2345 12/10/2013 0245  WBC  --  7.1  HGB  --  11.1*  HCT  --  32.0*  PLT 205 186   BMET  Recent Labs  12/03/2013 1900 11/14/2013 0245  NA 132* 128*  K 3.6* 3.7  CL 93* 92*  CO2 23 20  GLUCOSE 142* 148*  BUN 10 11  CREATININE 0.99 0.98  CALCIUM 8.5 8.0*    Studies/Results: Ct Angio Head W/cm &/or Wo Cm  11/29/2013   CLINICAL DATA:  Subarachnoid hemorrhage.  Evaluate for aneurysm.  EXAM: CT ANGIOGRAPHY HEAD  TECHNIQUE: Multidetector CT imaging of the head was performed using the standard protocol during bolus administration of intravenous contrast. Multiplanar CT image reconstructions and MIPs were obtained to evaluate the vascular anatomy.  CONTRAST:  13mL OMNIPAQUE IOHEXOL 350 MG/ML SOLN  COMPARISON:  Head CT 11/15/2013  FINDINGS: Extensive acute subarachnoid hemorrhage is again seen, greatest in the right sylvian fissure, right cerebral hemispheric sulci, and along the falx with some hemorrhage in the basilar cisterns. Overall volume of hemorrhage has not significantly changed. 2.7 cm hematoma centered in the region of the right insula has also not significantly changed in size with  moderate surrounding edema. 5 mm of leftward midline shift and partial effacement of the right lateral ventricle are unchanged. There is no hydrocephalus. No gross abnormal enhancement is identified. Orbits are unremarkable. Mastoid air cells and visualized paranasal sinuses are clear.  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 Visualized distal left vertebral artery is patent and supplies the basilar. Left PICA origin is patent. Right PICA appears patent and may arise from a common trunk with the right AICA. SCA origins are patent. Basilar artery is patent without stenosis. Basilar tip is slightly bulbous without discrete aneurysm. There is minimal bilateral PCA irregularity without evidence of significant stenosis. Posterior communicating arteries are not identified.  Internal carotid arteries are patent from skullbase to carotid termini. Moderate carotid siphon atherosclerotic calcification is present bilaterally with mild bilateral carotid siphon narrowing. There is moderate, diffuse narrowing of the right supraclinoid ICA. There is marked irregular narrowing of the proximal right MCA with only trace intermittent opacification of more distal right MCA branches in the area of extensive subarachnoid hemorrhage in the sylvian fissure. No opacified right MCA aneurysm is identified.  The right A1 segment is also moderately narrowed. There is also moderate irregular narrowing of the bilateral A2 and more distal ACA branch vessels. The left M1 segment is patent. There is a complex 7 x 6 mm aneurysm projecting laterally from the left MCA bifurcation. There is a 3 mm small outpouching projecting superiorly from the aneurysm. The aneurysm involves M2 trunk origins.  Review of the MIP images confirms  the above findings.  IMPRESSION: 1. Unchanged appearance of extensive, predominantly right-sided subarachnoid hemorrhage as well as 2.7 cm hematoma in the region of the right insula. Moderate surrounding edema with 5 mm of  unchanged midline shift. 2. No right MCA aneurysm is identified, however there is severe attenuation of the right MCA and its branch vessels which may reflect local mass effect and vasospasm secondary to surrounding hemorrhage, and this could obscure an underlying right MCA aneurysm. Narrowing of the ACAs and right ICA terminus also may reflect vasospasm. Catheter angiography may be helpful for further evaluation. 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  These results were called by telephone at the time of interpretation on 12/07/2013 at 9:55 pm to Dr. Erline Levine , who verbally acknowledged these results.   Electronically Signed   By: Logan Bores   On: 11/21/2013 22:07   Dg Chest 1 View  11/23/2013   CLINICAL DATA:  Acute respiratory failure.  Intubation.  EXAM: CHEST - 1 VIEW  COMPARISON:  Prior today on 11/13/2013  FINDINGS: Endotracheal tube remains in appropriate position. A new nasogastric tube is seen entering stomach.  Heart size is stable. Mild opacity in the lateral left lung base is stable. No evidence of pneumothorax or pleural effusion.  IMPRESSION: Endotracheal tube and nasogastric tube in appropriate position.  Stable mild opacity in lateral left lung base.   Electronically Signed   By: Earle Gell M.D.   On: 12/07/2013 21:18   Ct Head Wo Contrast  12/06/2013   CLINICAL DATA:  Follow-up intracranial hemorrhage.  EXAM: CT HEAD WITHOUT CONTRAST  TECHNIQUE: Contiguous axial images were obtained from the base of the skull through the vertex without intravenous contrast.  COMPARISON:  CT of the head November 18, 2013  FINDINGS: The extensive subarachnoid blood products centered in the right sylvian fissure, extending into the anterior middle cranial fossa, and along the anterior falx. Similar appearance of 2.7 x 2.4 cm right insular intraparenchymal hematoma. Low density surrounding vasogenic edema is relatively unchanged. 5 mm right to left midline shift, partially  effaced right lateral ventricle, no CT findings of entrapment or hydrocephalus. Trace subdural hematoma versus subarachnoid blood products along the posterior falx, and within the left quadrigeminal cistern.  No acute large vascular territory infarct. Moderate calcific atherosclerosis of the carotid siphons. No paranasal sinus air-fluid levels. The mastoid air cells are well aerated.  IMPRESSION: Similar extensive right greater than left subarachnoid blood, with a trace possible posterior falcine subdural hematoma.  Evolving 2.7 x 2.4 cm right insular hematoma with similar 5 mm right to left midline shift. No hydrocephalus.   Electronically Signed   By: Elon Alas   On: 12/09/2013 05:42   Dg Chest Port 1 View  12/09/2013   CLINICAL DATA:  Acute respiratory failure. On ventilator.  EXAM: PORTABLE CHEST - 1 VIEW  COMPARISON:  12/07/2013  FINDINGS: Endotracheal tube and nasogastric tube remain in appropriate position. Mild opacity in lateral left lung base shows no significant change. Lungs are otherwise clear. Heart size is within normal limits. No evidence of pneumothorax.  IMPRESSION: No significant change in mild opacity in the lateral left lung base.   Electronically Signed   By: Earle Gell M.D.   On: 12/05/2013 09:39    Assessment/Plan: Head CT stable without extension of hematoma.  Severe vasospasm on angio without demonstrated Right MCA aneurysm, although our suspicions remain high.  Left MCA bilobed aneurysm well demonstrated.  Patient receiving  intra-arterial therapy for vasospasm.  Will continue support along with ventilatory support today.    LOS: 1 day    Peggyann Shoals, MD 12/05/2013, 10:48 AM

## 2013-11-19 NOTE — Anesthesia Preprocedure Evaluation (Addendum)
Anesthesia Evaluation  Patient identified by MRN, date of birth, ID band  Reviewed: Allergy & Precautions, H&P , NPO status , Patient's Chart, lab work & pertinent test results, Unable to perform ROS - Chart review only  Airway       Dental   Pulmonary Current Smoker,          Cardiovascular hypertension, + CAD, + Past MI, + Cardiac Stents, + Peripheral Vascular Disease and +CHF     Neuro/Psych PSYCHIATRIC DISORDERS Anxiety Depression    GI/Hepatic GERD-  ,  Endo/Other    Renal/GU Renal disease     Musculoskeletal  (+) Arthritis -,   Abdominal   Peds  Hematology   Anesthesia Other Findings   Reproductive/Obstetrics                          Anesthesia Physical Anesthesia Plan  ASA: IV and emergent  Anesthesia Plan: General   Post-op Pain Management:    Induction: Inhalational  Airway Management Planned: Oral ETT  Additional Equipment: Arterial line  Intra-op Plan:   Post-operative Plan: Post-operative intubation/ventilation  Informed Consent:   Plan Discussed with: CRNA, Anesthesiologist and Surgeon  Anesthesia Plan Comments: (Pt emergently brought to IR, VSS, intubated, Aline placed.  )       Anesthesia Quick Evaluation

## 2013-11-19 NOTE — Progress Notes (Signed)
Dr Vertell Limber called after CTA results. 2 units of platelets orders.

## 2013-11-19 NOTE — Progress Notes (Signed)
Transported patient on the vent to C.T. Patient remained stable during transport.

## 2013-11-19 NOTE — Progress Notes (Signed)
SLP Cancellation Note  Patient Details Name: Carla Hoover MRN: [id number] DOB: 1950-06-06   Cancelled treatment:       Reason Eval/Treat Not Completed: Medical issues which prohibited therapy (Patient on vent. Will f/u 9/8. )  Carla Hoover, Carla Hoover [phone]  Carla Hoover 12/07/2013, 8:47 AM

## 2013-11-19 NOTE — Transfer of Care (Signed)
Immediate Anesthesia Transfer of Care Note  Patient: Carla Hoover  Procedure(s) Performed: Procedure(s): RADIOLOGY WITH ANESTHESIA (N/A)  Patient Location: ICU  Anesthesia Type:General  Level of Consciousness: Patient remains intubated per anesthesia plan  Airway & Oxygen Therapy: Patient remains intubated per anesthesia plan and Patient placed on Ventilator (see vital sign flow sheet for setting)  Post-op Assessment: Report given to PACU RN  Post vital signs: Reviewed and stable  Complications: No apparent anesthesia complications

## 2013-11-19 NOTE — Progress Notes (Signed)
OT Cancellation Note  Patient Details Name: VARIE MACHAMER MRN: [id number] DOB: 10/13/1950   Cancelled Treatment:    Reason Eval/Treat Not Completed: Medical issues which prohibited therapy - pt on bedrest.  Darlina Rumpf [place], OTR/L [phone]  12/11/2013, 2:40 PM

## 2013-11-20 ENCOUNTER — Inpatient Hospital Stay (HOSPITAL_COMMUNITY): Payer: Medicare Other

## 2013-11-20 ENCOUNTER — Encounter (HOSPITAL_COMMUNITY): Payer: Self-pay | Admitting: Interventional Radiology

## 2013-11-20 DIAGNOSIS — J9600 Acute respiratory failure, unspecified whether with hypoxia or hypercapnia: Secondary | ICD-10-CM

## 2013-11-20 DIAGNOSIS — Z9911 Dependence on respirator [ventilator] status: Secondary | ICD-10-CM

## 2013-11-20 DIAGNOSIS — IMO0002 Reserved for concepts with insufficient information to code with codable children: Secondary | ICD-10-CM

## 2013-11-20 LAB — PREPARE PLATELET PHERESIS
UNIT DIVISION: 0
UNIT DIVISION: 0

## 2013-11-20 LAB — CBC WITH DIFFERENTIAL/PLATELET
BASOS PCT: 1 % (ref 0–1)
Basophils Absolute: 0 10*3/uL (ref 0.0–0.1)
EOS PCT: 0 % (ref 0–5)
Eosinophils Absolute: 0 10*3/uL (ref 0.0–0.7)
HEMATOCRIT: 28 % — AB (ref 36.0–46.0)
HEMOGLOBIN: 9.6 g/dL — AB (ref 12.0–15.0)
LYMPHS PCT: 12 % (ref 12–46)
Lymphs Abs: 1 10*3/uL (ref 0.7–4.0)
MCH: 34.2 pg — ABNORMAL HIGH (ref 26.0–34.0)
MCHC: 34.3 g/dL (ref 30.0–36.0)
MCV: 99.6 fL (ref 78.0–100.0)
MONO ABS: 0.8 10*3/uL (ref 0.1–1.0)
Monocytes Relative: 10 % (ref 3–12)
Neutro Abs: 6.2 10*3/uL (ref 1.7–7.7)
Neutrophils Relative %: 78 % — ABNORMAL HIGH (ref 43–77)
Platelets: 248 10*3/uL (ref 150–400)
RBC: 2.81 MIL/uL — ABNORMAL LOW (ref 3.87–5.11)
RDW: 13.6 % (ref 11.5–15.5)
WBC: 8 10*3/uL (ref 4.0–10.5)

## 2013-11-20 LAB — COMPREHENSIVE METABOLIC PANEL
ALT: 24 U/L (ref 0–35)
ANION GAP: 13 (ref 5–15)
AST: 49 U/L — ABNORMAL HIGH (ref 0–37)
Albumin: 2.6 g/dL — ABNORMAL LOW (ref 3.5–5.2)
Alkaline Phosphatase: 86 U/L (ref 39–117)
BILIRUBIN TOTAL: 0.9 mg/dL (ref 0.3–1.2)
BUN: 10 mg/dL (ref 6–23)
CHLORIDE: 96 meq/L (ref 96–112)
CO2: 22 meq/L (ref 19–32)
CREATININE: 0.82 mg/dL (ref 0.50–1.10)
Calcium: 7.2 mg/dL — ABNORMAL LOW (ref 8.4–10.5)
GFR calc Af Amer: 85 mL/min — ABNORMAL LOW (ref >90)
GFR, EST NON AFRICAN AMERICAN: 73 mL/min — AB (ref >90)
Glucose, Bld: 109 mg/dL — ABNORMAL HIGH (ref 70–99)
Potassium: 3.2 mEq/L — ABNORMAL LOW (ref 3.7–5.3)
Sodium: 131 mEq/L — ABNORMAL LOW (ref 137–147)
Total Protein: 5.8 g/dL — ABNORMAL LOW (ref 6.0–8.3)

## 2013-11-20 MED ORDER — VALPROATE SODIUM 500 MG/5ML IV SOLN
500.0000 mg | Freq: Three times a day (TID) | INTRAVENOUS | Status: DC
  Administered 2013-11-20 – 2013-11-25 (×15): 500 mg via INTRAVENOUS
  Filled 2013-11-20 (×17): qty 5

## 2013-11-20 MED ORDER — VITAMIN B-1 100 MG PO TABS
100.0000 mg | ORAL_TABLET | Freq: Every day | ORAL | Status: DC
  Administered 2013-11-20 – 2013-12-06 (×16): 100 mg
  Filled 2013-11-20 (×18): qty 1

## 2013-11-20 MED ORDER — FOLIC ACID 1 MG PO TABS
1.0000 mg | ORAL_TABLET | Freq: Every day | ORAL | Status: DC
  Administered 2013-11-20 – 2013-12-06 (×17): 1 mg
  Filled 2013-11-20 (×18): qty 1

## 2013-11-20 MED FILL — Fentanyl Citrate Inj 0.05 MG/ML: INTRAMUSCULAR | Qty: 2 | Status: AC

## 2013-11-20 NOTE — Progress Notes (Signed)
INITIAL NUTRITION ASSESSMENT  DOCUMENTATION CODES Per approved criteria  -Not Applicable   INTERVENTION: If pt remains intubated recommend:  Vital High Protein start at 15 ml/hr and increase by 10 ml every 4 hours to goal rate of 35 ml/hr  30 ml Prostat daily  MVI daily  TF provides: 940 kcal, 88 grams protein, and 702 ml H2O TF and propofol provides: 1367 total kcal (98% of needs)  NUTRITION DIAGNOSIS: Inadequate oral intake related to inability to eat as evidenced by NPO status  Goal: Pt to meet >/= 90% of their estimated nutrition needs   Monitor:  Respiratory status, TF initiation/tolerance, weight trend  Reason for Assessment: Ventilator  63 y.o. female  Admitting Dx: ICH with SAH  ASSESSMENT: Pt fell one day prior to admission, presented with left sided weakness and facial drop. Per CT pt has ICH with SAH.  Patient is currently intubated on ventilator support MV: 9.4 L/min Temp (24hrs), Avg:98.3 F (36.8 C), Min:98.1 F (36.7 C), Max:98.7 F (37.1 C)  Propofol: 16.2 ml/hr provides: 427 kcal from lipid per day  Pt with hx of ETOH currently on thiamine and folic acid, also hx of COPD and tobacco abuse. Per notes hx of IBS.  Sodium low  Potassium low Nutrition-focused physical exam WDL.   Height: Ht Readings from Last 1 Encounters:  12/04/2013 5\' 4"  (1.626 m)    Weight: Wt Readings from Last 1 Encounters:  11/24/2013 149 lb 7.6 oz (67.8 kg)    Ideal Body Weight: 54.5 kg   % Ideal Body Weight: 124%  Wt Readings from Last 10 Encounters:  11/15/2013 149 lb 7.6 oz (67.8 kg)  12/11/2013 149 lb 7.6 oz (67.8 kg)  11/07/13 152 lb 12.8 oz (69.31 kg)  06/13/13 154 lb (69.854 kg)  06/13/13 154 lb (69.854 kg)  06/04/13 154 lb (69.854 kg)  03/30/13 156 lb 12.8 oz (71.124 kg)  01/18/13 157 lb 12.8 oz (71.578 kg)  10/26/12 156 lb (70.761 kg)  08/02/12 154 lb 6.4 oz (70.035 kg)    Usual Body Weight: 154 lb   % Usual Body Weight: 97%  BMI:  Body mass index is  25.64 kg/(m^2).  Estimated Nutritional Needs: Kcal: 1389 Protein: 85-100 grams Fluid: > 1.5 L/day  Skin: groin incision  Diet Order: NPO  EDUCATION NEEDS: -No education needs identified at this time   Intake/Output Summary (Last 24 hours) at 11/20/13 0836 Last data filed at 11/20/13 0800  Gross per 24 hour  Intake 3124.3 ml  Output   2065 ml  Net 1059.3 ml    Last BM: PTA   Labs:   Recent Labs Lab 12/09/2013 1900 11/28/2013 0245 11/20/13 0550  NA 132* 128* 131*  K 3.6* 3.7 3.2*  CL 93* 92* 96  CO2 23 20 22   BUN 10 11 10   CREATININE 0.99 0.98 0.82  CALCIUM 8.5 8.0* 7.2*  MG 1.0*  --   --   GLUCOSE 142* 148* 109*    CBG (last 3)  No results found for this basename: GLUCAP,  in the last 72 hours  Scheduled Meds: .  stroke: mapping our early stages of recovery book   Does not apply Once  . sodium chloride   Intravenous Once  . antiseptic oral rinse  7 mL Mouth Rinse QID  . chlorhexidine  15 mL Mouth Rinse BID  . FLUoxetine  20 mg Per Tube Daily  . folic acid  1 mg Per Tube Daily  . levETIRAcetam  500 mg Intravenous Q12H  .  metoprolol tartrate  25 mg Per Tube Q8H  . niMODipine  60 mg Per Tube Q4H  . pantoprazole sodium  40 mg Per Tube Q24H  . senna-docusate  1 tablet Oral BID  . simvastatin  40 mg Per Tube q1800  . sodium chloride 0.9 % 200 mL with verapamil (ISOPTIN) 20 mg infusion   Intravenous to XRAY  . thiamine  100 mg Per Tube Daily    Continuous Infusions: . 0.9 % NaCl with KCl 20 mEq / L Stopped (12/11/2013 0745)  . niCARDipine Stopped (11/16/2013 1145)  . phenylephrine (NEO-SYNEPHRINE) Adult infusion Stopped (12/11/2013 1604)  . propofol 20 mcg/kg/min (11/20/13 0244)    Past Medical History  Diagnosis Date  . Hypertension     a. Hypotension noted 05/2012.  Marland Kitchen Anxiety   . Depression   . Arthritis   . Irritable bowel syndrome (IBS)   . Diverticulitis   . Insomnia   . Eczema   . Gout   . GI bleed     Possible mild GI bleed 04/2011, hemoccult  negative at [facility] and no colonoscopy/EGD felt necessary by GI at that time  . Ischemic cardiomyopathy     IMPROVED. a. EF 40-45% by echo at [facility] 04/2011. b. Improved to 55% by echo 05/2012.  Marland Kitchen CAD (coronary artery disease)     a. s/p two overlapping BMS to LAD 05/12/11, Occluded mid LCX with collaterals. b. NSTEMI 05/2012: PTCA/DES to mid Cx and PTCA/DES to prox-mid LAD 05/18/12.  Marland Kitchen Hyperlipidemia   . AAA (abdominal aortic aneurysm) 05/19/2012    a. Infrarenal AAA by CT at [facility] 3.4cm 05/2012.  . Tobacco abuse   . Hepatic lesion     a. Right hepatic lobe density of uncertain significance by CT at [facility] 05/2012 - needs f/u PCP.  Marland Kitchen Acute renal insufficiency     05/2012 - Cr 1.7. Improved.  . Tobacco use disorder 05/25/2012  . GERD (gastroesophageal reflux disease) 01/18/2013    Past Surgical History  Procedure Laterality Date  . Abdominal hysterectomy    . Cardiac catheterization    . Coronary angioplasty  05/2011    2 BMS to mid LAD  . Colonoscopy      about 10 years, Dr. Hinton Lovely  . Colonoscopy N/A 02/15/2013    Procedure: COLONOSCOPY;  Surgeon: Daneil Dolin, MD;  [facility];  Service: Endoscopy;  Laterality: N/A;  2:15-moved to [place] notified pt  . Esophagogastroduodenoscopy (egd) with esophageal dilation N/A 02/15/2013    Procedure: ESOPHAGOGASTRODUODENOSCOPY (EGD) WITH ESOPHAGEAL DILATION;  Surgeon: Daneil Dolin, MD;  Location: [facility];  Service: Endoscopy;  Laterality: N/A;    Maylon Peppers RD, [place], [place] Pager [phone] After Hours Pager

## 2013-11-20 NOTE — Progress Notes (Signed)
PT Cancellation Note  Patient Details Name: Carla Hoover MRN: [id number] DOB: 02/06/1951   Cancelled Treatment:    Reason Eval/Treat Not Completed: Patient not medically ready (intubated and bedrest orders active)   Duncan Dull 11/20/2013, 1:32 PM Alben Deeds, [place] DPT  [phone]

## 2013-11-20 NOTE — Progress Notes (Signed)
Aspirin ordered and given at [place]. (24 hour post tpa).

## 2013-11-20 NOTE — Progress Notes (Signed)
Subjective: Patient reports still intubated, but awakens and follows commands  Objective: Vital signs in last 24 hours: Temp:  [98.1 F (36.7 C)-98.7 F (37.1 C)] 98.3 F (36.8 C) (09/08 0800) Pulse Rate:  [54-91] 70 (09/08 0813) Resp:  [14-21] 19 (09/08 0813) BP: (95-142)/(40-62) 122/50 mmHg (09/08 0813) SpO2:  [94 %-100 %] 98 % (09/08 0813) Arterial Line BP: (58-178)/(41-60) 138/60 mmHg (09/08 0800) FiO2 (%):  [30 %] 30 % (09/08 0813)  Intake/Output from previous day: 09/07 0701 - 09/08 0700 In: 2999.4 [I.V.:2544.4; NG/GT:55; IV Piggyback:400] Out: 1865 [Urine:1850; Blood:15] Intake/Output this shift: Total I/O In: 356.2 [I.V.:166.2; NG/GT:90; IV Piggyback:100] Out: 200 [Urine:200]  Physical Exam: PERRL, EOMI.  MAEW and follows commands.  Lab Results:  Recent Labs  11/29/2013 0245 11/20/13 0550  WBC 7.1 8.0  HGB 11.1* 9.6*  HCT 32.0* 28.0*  PLT 186 248   BMET  Recent Labs  11/13/2013 0245 11/20/13 0550  NA 128* 131*  K 3.7 3.2*  CL 92* 96  CO2 20 22  GLUCOSE 148* 109*  BUN 11 10  CREATININE 0.98 0.82  CALCIUM 8.0* 7.2*    Studies/Results: Ct Angio Head W/cm &/or Wo Cm  11/29/2013   CLINICAL DATA:  Subarachnoid hemorrhage.  Evaluate for aneurysm.  EXAM: CT ANGIOGRAPHY HEAD  TECHNIQUE: Multidetector CT imaging of the head was performed using the standard protocol during bolus administration of intravenous contrast. Multiplanar CT image reconstructions and MIPs were obtained to evaluate the vascular anatomy.  CONTRAST:  24mL OMNIPAQUE IOHEXOL 350 MG/ML SOLN  COMPARISON:  Head CT 11/23/2013  FINDINGS: Extensive acute subarachnoid hemorrhage is again seen, greatest in the right sylvian fissure, right cerebral hemispheric sulci, and along the falx with some hemorrhage in the basilar cisterns. Overall volume of hemorrhage has not significantly changed. 2.7 cm hematoma centered in the region of the right insula has also not significantly changed in size with moderate  surrounding edema. 5 mm of leftward midline shift and partial effacement of the right lateral ventricle are unchanged. There is no hydrocephalus. No gross abnormal enhancement is identified. Orbits are unremarkable. Mastoid air cells and visualized paranasal sinuses are clear.  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 Visualized distal left vertebral artery is patent and supplies the basilar. Left PICA origin is patent. Right PICA appears patent and may arise from a common trunk with the right AICA. SCA origins are patent. Basilar artery is patent without stenosis. Basilar tip is slightly bulbous without discrete aneurysm. There is minimal bilateral PCA irregularity without evidence of significant stenosis. Posterior communicating arteries are not identified.  Internal carotid arteries are patent from skullbase to carotid termini. Moderate carotid siphon atherosclerotic calcification is present bilaterally with mild bilateral carotid siphon narrowing. There is moderate, diffuse narrowing of the right supraclinoid ICA. There is marked irregular narrowing of the proximal right MCA with only trace intermittent opacification of more distal right MCA branches in the area of extensive subarachnoid hemorrhage in the sylvian fissure. No opacified right MCA aneurysm is identified.  The right A1 segment is also moderately narrowed. There is also moderate irregular narrowing of the bilateral A2 and more distal ACA branch vessels. The left M1 segment is patent. There is a complex 7 x 6 mm aneurysm projecting laterally from the left MCA bifurcation. There is a 3 mm small outpouching projecting superiorly from the aneurysm. The aneurysm involves M2 trunk origins.  Review of the MIP images confirms the above findings.  IMPRESSION: 1. Unchanged appearance of extensive, predominantly right-sided subarachnoid  hemorrhage as well as 2.7 cm hematoma in the region of the right insula. Moderate surrounding edema with 5 mm of unchanged  midline shift. 2. No right MCA aneurysm is identified, however there is severe attenuation of the right MCA and its branch vessels which may reflect local mass effect and vasospasm secondary to surrounding hemorrhage, and this could obscure an underlying right MCA aneurysm. Narrowing of the ACAs and right ICA terminus also may reflect vasospasm. Catheter angiography may be helpful for further evaluation. 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  These results were called by telephone at the time of interpretation on 11/27/2013 at 9:55 pm to Dr. Erline Levine , who verbally acknowledged these results.   Electronically Signed   By: Logan Bores   On: 11/25/2013 22:07   Dg Chest 1 View  11/23/2013   CLINICAL DATA:  Acute respiratory failure.  Intubation.  EXAM: CHEST - 1 VIEW  COMPARISON:  Prior today on 12/03/2013  FINDINGS: Endotracheal tube remains in appropriate position. A new nasogastric tube is seen entering stomach.  Heart size is stable. Mild opacity in the lateral left lung base is stable. No evidence of pneumothorax or pleural effusion.  IMPRESSION: Endotracheal tube and nasogastric tube in appropriate position.  Stable mild opacity in lateral left lung base.   Electronically Signed   By: Earle Gell M.D.   On: 12/05/2013 21:18   Ct Head Wo Contrast  11/20/2013   CLINICAL DATA:  Followup intracranial hemorrhage.  EXAM: CT HEAD WITHOUT CONTRAST  TECHNIQUE: Contiguous axial images were obtained from the base of the skull through the vertex without intravenous contrast.  COMPARISON:  12/04/2013.  FINDINGS: Right frontal parenchymal hematoma is unchanged in size. Hemorrhage extends to the subarachnoid space over the right frontal, parietal and temporal lobes. Subdural hemorrhage tracks along the interhemispheric fissure. Subarachnoid hemorrhage in the posterior fossa is evident along the right pre pontine cistern extending toward the right cerebellopontine ankle, and in the  left quadrigeminal plate cistern. There is no evidence of new intracranial hemorrhage. No evidence of an ischemic infarct.  Vasogenic edema surrounding the right frontal parenchymal hematoma similar. Mass effect with partial effacement of the right lateral ventricle and 4.8 mm of left midline shift are without substantial change.  Visualized sinuses and mastoid air cells are clear.  IMPRESSION: 1. No change in the intracranial hemorrhage since the most recent prior study. Right frontal parenchymal hematoma is stable in size as is the degree of mass effect. Subarachnoid hemorrhage and areas of subdural hemorrhage are stable. No new intracranial hemorrhage. 2. Mass effect and midline shift ir stable. 3. No hydrocephalus.   Electronically Signed   By: Lajean Manes M.D.   On: 11/20/2013 09:03   Ct Head Wo Contrast  11/17/2013   CLINICAL DATA:  Follow-up intracranial hemorrhage.  EXAM: CT HEAD WITHOUT CONTRAST  TECHNIQUE: Contiguous axial images were obtained from the base of the skull through the vertex without intravenous contrast.  COMPARISON:  CT of the head November 18, 2013  FINDINGS: The extensive subarachnoid blood products centered in the right sylvian fissure, extending into the anterior middle cranial fossa, and along the anterior falx. Similar appearance of 2.7 x 2.4 cm right insular intraparenchymal hematoma. Low density surrounding vasogenic edema is relatively unchanged. 5 mm right to left midline shift, partially effaced right lateral ventricle, no CT findings of entrapment or hydrocephalus. Trace subdural hematoma versus subarachnoid blood products along the posterior falx, and within the  left quadrigeminal cistern.  No acute large vascular territory infarct. Moderate calcific atherosclerosis of the carotid siphons. No paranasal sinus air-fluid levels. The mastoid air cells are well aerated.  IMPRESSION: Similar extensive right greater than left subarachnoid blood, with a trace possible posterior  falcine subdural hematoma.  Evolving 2.7 x 2.4 cm right insular hematoma with similar 5 mm right to left midline shift. No hydrocephalus.   Electronically Signed   By: Elon Alas   On: 12/12/2013 05:42   Dg Chest Port 1 View  11/20/2013   CLINICAL DATA:  Respiratory failure, followup  EXAM: PORTABLE CHEST - 1 VIEW  COMPARISON:  Portable chest x-ray 11/15/2013  FINDINGS: The tip of the endotracheal tube is approximately 3.0 cm above the carina. Opacity has increased somewhat of the left lung base most consistent with atelectasis with slight elevation of the left hemidiaphragm. Heart size is stable. NG tube remains.  IMPRESSION: Slight increase in left basilar opacity most consistent with atelectasis.   Electronically Signed   By: Ivar Drape M.D.   On: 11/20/2013 07:59   Dg Chest Port 1 View  11/14/2013   CLINICAL DATA:  Acute respiratory failure. On ventilator.  EXAM: PORTABLE CHEST - 1 VIEW  COMPARISON:  11/30/2013  FINDINGS: Endotracheal tube and nasogastric tube remain in appropriate position. Mild opacity in lateral left lung base shows no significant change. Lungs are otherwise clear. Heart size is within normal limits. No evidence of pneumothorax.  IMPRESSION: No significant change in mild opacity in the lateral left lung base.   Electronically Signed   By: Earle Gell M.D.   On: 12/04/2013 09:39    Assessment/Plan: Head CT stable.  OK to extubate.  Continue to support BP (known vasospasm, but with unclipped aneurysm, keep 130-160).    LOS: 2 days    Peggyann Shoals, MD 11/20/2013, 10:42 AM

## 2013-11-20 NOTE — Progress Notes (Signed)
OT Cancellation Note  Patient Details Name: Carla Hoover MRN: [id number] DOB: 07-23-1950   Cancelled Treatment:    Reason Eval/Treat Not Completed: Patient not medically ready (intubated bedrest)  [name] Pager: [phone]  11/20/2013, 11:17 AM

## 2013-11-20 NOTE — Consult Note (Signed)
PULMONARY / CRITICAL CARE MEDICINE   Name: Carla Hoover MRN: [id number] DOB: [birth year]    ADMISSION DATE:  11/16/2013 CONSULTATION DATE:  11/18/2013  REFERRING MD :  Erline Levine  CHIEF COMPLAINT:  Weakness  INITIAL PRESENTATION:  63 yo female fell and hit her head one day prior to admit.  Presented to [facility] with Lt sided weakness and facial drop.  CT head showed ICH with SAH.  She required intubation prior to transfer to [facility].  PCCM consulted for vent/medical management.  STUDIES:  9/06 CT head ([place]) 9/07 Cerebral arteriogram >> 7 mm Lt MCA Aneurysm, vasospasm of Rt MCA  SIGNIFICANT EVENTS: 9/06 Transfer from [facility] to [place]:  Going for repeat CT head this AM. VITAL SIGNS: Temp:  [98.1 F (36.7 C)-98.7 F (37.1 C)] 98.3 F (36.8 C) (09/08 0800) Pulse Rate:  [54-91] 70 (09/08 0813) Resp:  [14-21] 19 (09/08 0813) BP: (95-142)/(40-62) 122/50 mmHg (09/08 0813) SpO2:  [94 %-100 %] 98 % (09/08 0813) Arterial Line BP: (58-178)/(41-60) 138/60 mmHg (09/08 0800) FiO2 (%):  [30 %] 30 % (09/08 0813) VENTILATOR SETTINGS: Vent Mode:  [-] PRVC FiO2 (%):  [30 %] 30 % Set Rate:  [14 bmp] 14 bmp Vt Set:  [500 mL] 500 mL PEEP:  [5 cmH20] 5 cmH20 Plateau Pressure:  [17 cmH20-22 cmH20] 17 cmH20 INTAKE / OUTPUT:  Intake/Output Summary (Last 24 hours) at 11/20/13 0820 Last data filed at 11/20/13 0800  Gross per 24 hour  Intake 3124.3 ml  Output   2065 ml  Net 1059.3 ml    PHYSICAL EXAMINATION: General:  Laying in bed.  Intubated, sedated with propofol. Neuro:  sedated HEENT:  PERRL Cardiovascular:  NRRR, no MRG Lungs:  CTAB, no wrr Abdomen:  Soft, NTND, +BS Musculoskeletal:  No CCE Skin:  No rashes or other lesions.   LABS:  CBC  Recent Labs Lab 11/17/2013 2345 11/26/2013 0245 11/20/13 0550  WBC  --  7.1 8.0  HGB  --  11.1* 9.6*  HCT  --  32.0* 28.0*  PLT 205 186 248   Coag's  Recent Labs Lab 12/03/2013 1900  INR 0.93    BMET  Recent Labs Lab 12/11/2013 1900 11/17/2013 0245 11/20/13 0550  NA 132* 128* 131*  K 3.6* 3.7 3.2*  CL 93* 92* 96  CO2 23 20 22   BUN 10 11 10   CREATININE 0.99 0.98 0.82  GLUCOSE 142* 148* 109*   Electrolytes  Recent Labs Lab 12/01/2013 1900 11/13/2013 0245 11/20/13 0550  CALCIUM 8.5 8.0* 7.2*  MG 1.0*  --   --    ABG  Recent Labs Lab 12/12/2013 2014  PHART 7.425  PCO2ART 34.2*  PO2ART 216.0*   Liver Enzymes  Recent Labs Lab 11/19/2013 1900 11/20/13 0550  AST 74* 49*  ALT 37* 24  ALKPHOS 122* 86  BILITOT 1.2 0.9  ALBUMIN 3.3* 2.6*   Imaging Ct Head Wo Contrast  11/28/2013   CLINICAL DATA:  Follow-up intracranial hemorrhage.  EXAM: CT HEAD WITHOUT CONTRAST  TECHNIQUE: Contiguous axial images were obtained from the base of the skull through the vertex without intravenous contrast.  COMPARISON:  CT of the head November 18, 2013  FINDINGS: The extensive subarachnoid blood products centered in the right sylvian fissure, extending into the anterior middle cranial fossa, and along the anterior falx. Similar appearance of 2.7 x 2.4 cm right insular intraparenchymal hematoma. Low density surrounding vasogenic edema is relatively unchanged. 5 mm right to left midline shift, partially effaced  right lateral ventricle, no CT findings of entrapment or hydrocephalus. Trace subdural hematoma versus subarachnoid blood products along the posterior falx, and within the left quadrigeminal cistern.  No acute large vascular territory infarct. Moderate calcific atherosclerosis of the carotid siphons. No paranasal sinus air-fluid levels. The mastoid air cells are well aerated.  IMPRESSION: Similar extensive right greater than left subarachnoid blood, with a trace possible posterior falcine subdural hematoma.  Evolving 2.7 x 2.4 cm right insular hematoma with similar 5 mm right to left midline shift. No hydrocephalus.   Electronically Signed   By: Elon Alas   On: 11/23/2013 05:42   Dg  Chest Port 1 View  11/18/2013   CLINICAL DATA:  Acute respiratory failure. On ventilator.  EXAM: PORTABLE CHEST - 1 VIEW  COMPARISON:  11/26/2013  FINDINGS: Endotracheal tube and nasogastric tube remain in appropriate position. Mild opacity in lateral left lung base shows no significant change. Lungs are otherwise clear. Heart size is within normal limits. No evidence of pneumothorax.  IMPRESSION: No significant change in mild opacity in the lateral left lung base.   Electronically Signed   By: Earle Gell M.D.   On: 11/20/2013 09:39     ASSESSMENT / PLAN: NEUROLOGIC A:   ICH with SAH. Hx of anxiety, depression, insomnia. Hx of ETOH. P:   RASS goal: -1 PAD 3 with diprivan and prn versed/fentanyl Keppra for seizure prevention per neurology Nimotop per neurosurgery F/u TCD's Continue prozac Will need CIWA when off GABAnergic agents for sedation.  Thiamine, folic acid  PULMONARY OETT 9/06 >> A: Acute respiratory failure in setting of ICH. Atelectasis. Hx of COPD, tobacco abuse. P:   Full vent support until neuro status more stable F/u CXR PRN albuterol Hold outpt advair for now  CARDIOVASCULAR Lt radial aline 9/07 >>  A:  Hx of CAD, hyperlipidemia, HTN, chronic systolic heart failure, AAA. P:  Monitor hemodynamics Hold ASA, plavix in setting of ICH Continue lasix, imdur, lopressor, pravachol for now  RENAL A:   CKD. Hyponatremia. Hypokalemia. P:   Monitor renal fx, urine outpt, electrolytes  GASTROINTESTINAL A:   Nutrition. Hx of IBS. P:   NPO Protonix for SUP Tube feeds if unable to extubate soon  HEMATOLOGIC A:   Anemia of critical illness. P:  SCD for DVT prevention F/u CBC  INFECTIOUS A:   No evidence for infection. P:   Monitor clinically  ENDOCRINE A:   No acute issues.   P:   Monitor blood sugar on BMET  TODAY'S SUMMARY: Will continue full vent support until neuro status more stable.  CC time 35 minutes.  Chesley Mires, MD [facility]  Pulmonary/Critical Care 11/20/2013, 8:24 AM Pager:  [phone] After 3pm call: [phone]

## 2013-11-20 NOTE — Consult Note (Signed)
Stroke Consult Note    Chief Complaint:  Brain hemorrhage HPI: Carla Hoover is an 63 y.o. female  is unable to provide any history as she is intubated. There is no imaging family available at the bedside. As per review of the patient's chart the patient fell on 11/17/13 the night. She was brought into [facility] hospital with left-sided weakness and facial droop. She was complaining of a headache and CT scan of the head showed extensive blood in the right sylvian fissure and layering in the region of the right MCA as well as a evolving 2.7 x 2.4 cm right insular hematoma with similar 5 mm right to left midline shift. No hydrocephalus. CT angiogram personally reviewed 19/6/15 she was stable appearance of predominantly right-sided subarachnoid hemorrhage and 2.7 cm right insular hematoma with 5 mm midline shift. No definite right MCA aneurysm was noted but diminished caliber of the right MCA branches history local mass effect versus vasospasm. 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 She subsequently underwent catheter angiogram by Dr. Estanislado Pandy on9/8/15 which confirmed the left MCA aneurysm but no clear right MCA aneurysm was noted but diminished caliber of branches of right MCA and ACA suggested vasospasm. Patient was treated with intra-arterial verapamil with improvement in vessel calibre. Patient has remained and blood pressure tightly controlled. She has been started on nimodipine. She underwent transcranial Doppler study done earlier today which I have personally reviewed is suboptimal with waveforms but shows low normal mean flow velocities in anterior circulation with elevated pulsatility indices suggestive of intraventricular pressure. Posterior circulation evaluation is not done due to presence of straps and technical difficulties. The patient is intubated but arousable and follows commands and complains of headache. She is able to move all 4 extremities but has mild left-sided  weaknes  LSN: p.m. 11/17/13 tPA Given: No: ICH  Past Medical History  Diagnosis Date  . Hypertension     a. Hypotension noted 05/2012.  Marland Kitchen Anxiety   . Depression   . Arthritis   . Irritable bowel syndrome (IBS)   . Diverticulitis   . Insomnia   . Eczema   . Gout   . GI bleed     Possible mild GI bleed 04/2011, hemoccult negative at [facility] and no colonoscopy/EGD felt necessary by GI at that time  . Ischemic cardiomyopathy     IMPROVED. a. EF 40-45% by echo at [facility] 04/2011. b. Improved to 55% by echo 05/2012.  Marland Kitchen CAD (coronary artery disease)     a. s/p two overlapping BMS to LAD 05/12/11, Occluded mid LCX with collaterals. b. NSTEMI 05/2012: PTCA/DES to mid Cx and PTCA/DES to prox-mid LAD 05/18/12.  Marland Kitchen Hyperlipidemia   . AAA (abdominal aortic aneurysm) 05/19/2012    a. Infrarenal AAA by CT at [facility] 3.4cm 05/2012.  . Tobacco abuse   . Hepatic lesion     a. Right hepatic lobe density of uncertain significance by CT at [facility] 05/2012 - needs f/u PCP.  Marland Kitchen Acute renal insufficiency     05/2012 - Cr 1.7. Improved.  . Tobacco use disorder 05/25/2012  . GERD (gastroesophageal reflux disease) 01/18/2013    Past Surgical History  Procedure Laterality Date  . Abdominal hysterectomy    . Cardiac catheterization    . Coronary angioplasty  05/2011    2 BMS to mid LAD  . Colonoscopy      about 10 years, Dr. Hinton Lovely  . Colonoscopy N/A 02/15/2013    Procedure: COLONOSCOPY;  Surgeon: Daneil Dolin, MD;  Location: [facility];  Service: Endoscopy;  Laterality: N/A;  2:15-moved to [place] notified pt  . Esophagogastroduodenoscopy (egd) with esophageal dilation N/A 02/15/2013    Procedure: ESOPHAGOGASTRODUODENOSCOPY (EGD) WITH ESOPHAGEAL DILATION;  Surgeon: Daneil Dolin, MD;  Location: [facility];  Service: Endoscopy;  Laterality: N/A;    Family History  Problem Relation Age of Onset  . Colon cancer Neg Hx   . Lung cancer Brother     recurrent X 4   Social History:   reports that she has been smoking Cigarettes.  She has been smoking about 0.00 packs per day for the past 50 years. She has never used smokeless tobacco. She reports that she drinks about 1.2 ounces of alcohol per week. She reports that she does not use illicit drugs.  Allergies:  Allergies  Allergen Reactions  . Penicillins Swelling and Other (See Comments)    Per patient report, she has facial and tongue swellling  . Percocet [Oxycodone-Acetaminophen] Other (See Comments)    Hallucinations  . Percodan [Oxycodone-Aspirin] Other (See Comments)    Hallucinations    Medications Prior to Admission  Medication Sig Dispense Refill  . amLODipine (NORVASC) 5 MG tablet Take 5 mg by mouth daily.      Marland Kitchen aspirin 81 MG tablet Take 81 mg by mouth daily.      . clopidogrel (PLAVIX) 75 MG tablet Take 1 tablet (75 mg total) by mouth daily.  30 tablet  11  . FLUoxetine (PROZAC) 20 MG capsule Take 20 mg by mouth daily.      . Fluticasone-Salmeterol (ADVAIR DISKUS) 100-50 MCG/DOSE AEPB Inhale 1 puff into the lungs every 12 (twelve) hours.       . furosemide (LASIX) 20 MG tablet Take 20 mg by mouth daily.       . isosorbide mononitrate (IMDUR) 60 MG 24 hr tablet Take 1 tablet (60 mg total) by mouth daily.  30 tablet  6  . LORazepam (ATIVAN) 1 MG tablet Take 1 tablet (1 mg total) by mouth every 6 (six) hours as needed for anxiety.  30 tablet  0  . metoprolol (LOPRESSOR) 50 MG tablet Take 50 mg by mouth 2 (two) times daily.      . nitroGLYCERIN (NITROSTAT) 0.4 MG SL tablet Place 0.4 mg under the tongue every 5 (five) minutes as needed for chest pain. For chest pain      . pantoprazole (PROTONIX) 40 MG tablet Take 1 tablet (40 mg total) by mouth daily before breakfast.  30 tablet  11  . polyethylene glycol (MIRALAX / GLYCOLAX) packet Take 17 g by mouth daily.       . pravastatin (PRAVACHOL) 80 MG tablet Take 80 mg by mouth daily.        ROS: Positive for headache, facial weakness, vomitting and left sided  weakness  Physical Examination: Blood pressure 147/74, pulse 62, temperature 98.7 F (37.1 C), temperature source Axillary, resp. rate 22, height 5' 4"  (1.626 m), weight 149 lb 7.6 oz (67.8 kg), SpO2 99.00%.   [name] intubated.   Afebrile. Head is nontraumatic. Neck is supple without bruit.    l. Cardiac exam no murmur or gallop. Lungs are clear to auscultation. Distal pulses are well felt.  Neurologic Examination: Drowsy but arouses easily and follows simple midline and one-step commands. Pupils are equal and reactive. Fundi were not visualized. Vision acuity cannot be reliably tested. Right gaze preference but able to look  to the left past midline but not all the way. Blinks to threat on the right but not as well on the left. Left lower facial weakness. Tongue midline. Able to move all 4 extremities but right side more than left. Has antigravity strength on the left with mild weakness of left grip and hip flexors. Deep tendon reflexes are 2+ symmetric. Right plantar is downgoing left is equivocal. Withdraws to painful stimuli not 4 extremities. Coordination and gait cannot be tested.  Results for orders placed during the hospital encounter of 12/09/2013 (from the past 48 hour(s))  MRSA PCR SCREENING     Status: None   Collection Time    11/27/2013  6:37 PM      Result Value Ref Range   MRSA by PCR NEGATIVE  NEGATIVE   Comment:            The GeneXpert MRSA Assay (FDA     approved for NASAL specimens     only), is one component of a     comprehensive MRSA colonization     surveillance program. It is not     intended to diagnose MRSA     infection nor to guide or     monitor treatment for     MRSA infections.  TRIGLYCERIDES     Status: None   Collection Time    11/15/2013  7:00 PM      Result Value Ref Range   Triglycerides 90  <150 mg/dL  COMPREHENSIVE METABOLIC PANEL     Status: Abnormal   Collection Time    11/17/2013  7:00 PM      Result Value Ref Range   Sodium  132 (*) 137 - 147 mEq/L   Potassium 3.6 (*) 3.7 - 5.3 mEq/L   Chloride 93 (*) 96 - 112 mEq/L   CO2 23  19 - 32 mEq/L   Glucose, Bld 142 (*) 70 - 99 mg/dL   BUN 10  6 - 23 mg/dL   Creatinine, Ser 0.99  0.50 - 1.10 mg/dL   Calcium 8.5  8.4 - 10.5 mg/dL   Total Protein 6.8  6.0 - 8.3 g/dL   Albumin 3.3 (*) 3.5 - 5.2 g/dL   AST 74 (*) 0 - 37 U/L   ALT 37 (*) 0 - 35 U/L   Alkaline Phosphatase 122 (*) 39 - 117 U/L   Total Bilirubin 1.2  0.3 - 1.2 mg/dL   GFR calc non Af Amer 59 (*) >90 mL/min   GFR calc Af Amer 68 (*) >90 mL/min   Comment: (NOTE)     The eGFR has been calculated using the CKD EPI equation.     This calculation has not been validated in all clinical situations.     eGFR's persistently <90 mL/min signify possible Chronic Kidney     Disease.   Anion gap 16 (*) 5 - 15  PROTIME-INR     Status: None   Collection Time    12/01/2013  7:00 PM      Result Value Ref Range   Prothrombin Time 12.5  11.6 - 15.2 seconds   INR 0.93  0.00 - 1.49  MAGNESIUM     Status: Abnormal   Collection Time    12/07/2013  7:00 PM      Result Value Ref Range   Magnesium 1.0 (*) 1.5 - 2.5 mg/dL  BLOOD GAS, ARTERIAL     Status: Abnormal   Collection Time    12/02/2013  8:14 PM  Result Value Ref Range   FIO2 0.60     Delivery systems VENTILATOR     Mode PRESSURE REGULATED VOLUME CONTROL     VT 500     Rate 14     Peep/cpap 5.0     pH, Arterial 7.425  7.350 - 7.450   pCO2 arterial 34.2 (*) 35.0 - 45.0 mmHg   pO2, Arterial 216.0 (*) 80.0 - 100.0 mmHg   Bicarbonate 22.0  20.0 - 24.0 mEq/L   TCO2 23.1  0 - 100 mmol/L   Acid-base deficit 1.7  0.0 - 2.0 mmol/L   O2 Saturation 99.5     Patient temperature 98.6     Collection site BRACHIAL ARTERY     Drawn by [id number]     Sample type ARTERIAL DRAW     Allens test (pass/fail) PASS  PASS  PREPARE PLATELET PHERESIS     Status: None   Collection Time    11/30/2013 11:00 PM      Result Value Ref Range   Unit Number [id number]     Blood Component  Type PLTPHER LR2     Unit division 00     Status of Unit ISSUED,FINAL     Transfusion Status OK TO TRANSFUSE     Unit Number [id number]     Blood Component Type PLTPHER LR1     Unit division 00     Status of Unit ISSUED,FINAL     Transfusion Status OK TO TRANSFUSE    TYPE AND SCREEN     Status: None   Collection Time    11/28/2013 11:45 PM      Result Value Ref Range   ABO/RH(D) A NEG     Antibody Screen NEG     Sample Expiration 11/21/2013     Unit Number [id number]     Blood Component Type RED CELLS,LR     Unit division 00     Status of Unit ALLOCATED     Transfusion Status OK TO TRANSFUSE     Crossmatch Result Compatible     Unit Number [id number]     Blood Component Type RBC LR PHER2     Unit division 00     Status of Unit ALLOCATED     Transfusion Status OK TO TRANSFUSE     Crossmatch Result Compatible    PLATELET COUNT     Status: None   Collection Time    12/01/2013 11:45 PM      Result Value Ref Range   Platelets 205  150 - 400 K/uL  ABO/RH     Status: None   Collection Time    11/17/2013 11:45 PM      Result Value Ref Range   ABO/RH(D) A NEG    CBC     Status: Abnormal   Collection Time    11/15/2013  2:45 AM      Result Value Ref Range   WBC 7.1  4.0 - 10.5 K/uL   RBC 3.19 (*) 3.87 - 5.11 MIL/uL   Hemoglobin 11.1 (*) 12.0 - 15.0 g/dL   HCT 32.0 (*) 36.0 - 46.0 %   MCV 100.3 (*) 78.0 - 100.0 fL   MCH 34.8 (*) 26.0 - 34.0 pg   MCHC 34.7  30.0 - 36.0 g/dL   RDW 13.1  11.5 - 15.5 %   Platelets 186  150 - 400 K/uL  BASIC METABOLIC PANEL     Status: Abnormal   Collection Time    12/02/2013  2:45 AM      Result Value Ref Range   Sodium 128 (*) 137 - 147 mEq/L   Potassium 3.7  3.7 - 5.3 mEq/L   Chloride 92 (*) 96 - 112 mEq/L   CO2 20  19 - 32 mEq/L   Glucose, Bld 148 (*) 70 - 99 mg/dL   BUN 11  6 - 23 mg/dL   Creatinine, Ser 0.98  0.50 - 1.10 mg/dL   Calcium 8.0 (*) 8.4 - 10.5 mg/dL   GFR calc non Af Amer 59 (*) >90 mL/min   GFR calc Af Amer 69 (*) >90  mL/min   Comment: (NOTE)     The eGFR has been calculated using the CKD EPI equation.     This calculation has not been validated in all clinical situations.     eGFR's persistently <90 mL/min signify possible Chronic Kidney     Disease.   Anion gap 16 (*) 5 - 15  PREPARE RBC (CROSSMATCH)     Status: None   Collection Time    11/17/2013  9:14 AM      Result Value Ref Range   Order Confirmation ORDER PROCESSED BY BLOOD BANK    POCT ACTIVATED CLOTTING TIME     Status: None   Collection Time    12/06/2013 10:52 AM      Result Value Ref Range   Activated Clotting Time 123    CBC WITH DIFFERENTIAL     Status: Abnormal   Collection Time    11/20/13  5:50 AM      Result Value Ref Range   WBC 8.0  4.0 - 10.5 K/uL   RBC 2.81 (*) 3.87 - 5.11 MIL/uL   Hemoglobin 9.6 (*) 12.0 - 15.0 g/dL   HCT 28.0 (*) 36.0 - 46.0 %   MCV 99.6  78.0 - 100.0 fL   MCH 34.2 (*) 26.0 - 34.0 pg   MCHC 34.3  30.0 - 36.0 g/dL   RDW 13.6  11.5 - 15.5 %   Platelets 248  150 - 400 K/uL   Comment: REPEATED TO VERIFY   Neutrophils Relative % 78 (*) 43 - 77 %   Neutro Abs 6.2  1.7 - 7.7 K/uL   Lymphocytes Relative 12  12 - 46 %   Lymphs Abs 1.0  0.7 - 4.0 K/uL   Monocytes Relative 10  3 - 12 %   Monocytes Absolute 0.8  0.1 - 1.0 K/uL   Eosinophils Relative 0  0 - 5 %   Eosinophils Absolute 0.0  0.0 - 0.7 K/uL   Basophils Relative 1  0 - 1 %   Basophils Absolute 0.0  0.0 - 0.1 K/uL  COMPREHENSIVE METABOLIC PANEL     Status: Abnormal   Collection Time    11/20/13  5:50 AM      Result Value Ref Range   Sodium 131 (*) 137 - 147 mEq/L   Potassium 3.2 (*) 3.7 - 5.3 mEq/L   Chloride 96  96 - 112 mEq/L   CO2 22  19 - 32 mEq/L   Glucose, Bld 109 (*) 70 - 99 mg/dL   BUN 10  6 - 23 mg/dL   Creatinine, Ser 0.82  0.50 - 1.10 mg/dL   Calcium 7.2 (*) 8.4 - 10.5 mg/dL   Total Protein 5.8 (*) 6.0 - 8.3 g/dL   Albumin 2.6 (*) 3.5 - 5.2 g/dL   AST 49 (*) 0 - 37 U/L   ALT 24  0 -  35 U/L   Alkaline Phosphatase 86  39 - 117  U/L   Total Bilirubin 0.9  0.3 - 1.2 mg/dL   GFR calc non Af Amer 73 (*) >90 mL/min   GFR calc Af Amer 85 (*) >90 mL/min   Comment: (NOTE)     The eGFR has been calculated using the CKD EPI equation.     This calculation has not been validated in all clinical situations.     eGFR's persistently <90 mL/min signify possible Chronic Kidney     Disease.   Anion gap 13  5 - 15   Ct Angio Head W/cm &/or Wo Cm  11/20/2013   CLINICAL DATA:  Subarachnoid hemorrhage.  Evaluate for aneurysm.  EXAM: CT ANGIOGRAPHY HEAD  TECHNIQUE: Multidetector CT imaging of the head was performed using the standard protocol during bolus administration of intravenous contrast. Multiplanar CT image reconstructions and MIPs were obtained to evaluate the vascular anatomy.  CONTRAST:  46m OMNIPAQUE IOHEXOL 350 MG/ML SOLN  COMPARISON:  Head CT 11/16/2013  FINDINGS: Extensive acute subarachnoid hemorrhage is again seen, greatest in the right sylvian fissure, right cerebral hemispheric sulci, and along the falx with some hemorrhage in the basilar cisterns. Overall volume of hemorrhage has not significantly changed. 2.7 cm hematoma centered in the region of the right insula has also not significantly changed in size with moderate surrounding edema. 5 mm of leftward midline shift and partial effacement of the right lateral ventricle are unchanged. There is no hydrocephalus. No gross abnormal enhancement is identified. Orbits are unremarkable. Mastoid air cells and visualized paranasal sinuses are clear.  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 Visualized distal left vertebral artery is patent and supplies the basilar. Left PICA origin is patent. Right PICA appears patent and may arise from a common trunk with the right AICA. SCA origins are patent. Basilar artery is patent without stenosis. Basilar tip is slightly bulbous without discrete aneurysm. There is minimal bilateral PCA irregularity without evidence of significant stenosis.  Posterior communicating arteries are not identified.  Internal carotid arteries are patent from skullbase to carotid termini. Moderate carotid siphon atherosclerotic calcification is present bilaterally with mild bilateral carotid siphon narrowing. There is moderate, diffuse narrowing of the right supraclinoid ICA. There is marked irregular narrowing of the proximal right MCA with only trace intermittent opacification of more distal right MCA branches in the area of extensive subarachnoid hemorrhage in the sylvian fissure. No opacified right MCA aneurysm is identified.  The right A1 segment is also moderately narrowed. There is also moderate irregular narrowing of the bilateral A2 and more distal ACA branch vessels. The left M1 segment is patent. There is a complex 7 x 6 mm aneurysm projecting laterally from the left MCA bifurcation. There is a 3 mm small outpouching projecting superiorly from the aneurysm. The aneurysm involves M2 trunk origins.  Review of the MIP images confirms the above findings.  IMPRESSION: 1. Unchanged appearance of extensive, predominantly right-sided subarachnoid hemorrhage as well as 2.7 cm hematoma in the region of the right insula. Moderate surrounding edema with 5 mm of unchanged midline shift. 2. No right MCA aneurysm is identified, however there is severe attenuation of the right MCA and its branch vessels which may reflect local mass effect and vasospasm secondary to surrounding hemorrhage, and this could obscure an underlying right MCA aneurysm. Narrowing of the ACAs and right ICA terminus also may reflect vasospasm. Catheter angiography may be helpful for further evaluation. 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  4. Occluded right vertebral artery.  These results were called by telephone at the time of interpretation on 12/12/2013 at 9:55 pm to Dr. Erline Levine , who verbally acknowledged these results.   Electronically Signed   By: Logan Bores   On: 12/10/2013 22:07   Dg Chest 1  View  11/24/2013   CLINICAL DATA:  Acute respiratory failure.  Intubation.  EXAM: CHEST - 1 VIEW  COMPARISON:  Prior today on 12/02/2013  FINDINGS: Endotracheal tube remains in appropriate position. A new nasogastric tube is seen entering stomach.  Heart size is stable. Mild opacity in the lateral left lung base is stable. No evidence of pneumothorax or pleural effusion.  IMPRESSION: Endotracheal tube and nasogastric tube in appropriate position.  Stable mild opacity in lateral left lung base.   Electronically Signed   By: Earle Gell M.D.   On: 12/06/2013 21:18   Ct Head Wo Contrast  11/20/2013   CLINICAL DATA:  Followup intracranial hemorrhage.  EXAM: CT HEAD WITHOUT CONTRAST  TECHNIQUE: Contiguous axial images were obtained from the base of the skull through the vertex without intravenous contrast.  COMPARISON:  11/26/2013.  FINDINGS: Right frontal parenchymal hematoma is unchanged in size. Hemorrhage extends to the subarachnoid space over the right frontal, parietal and temporal lobes. Subdural hemorrhage tracks along the interhemispheric fissure. Subarachnoid hemorrhage in the posterior fossa is evident along the right pre pontine cistern extending toward the right cerebellopontine ankle, and in the left quadrigeminal plate cistern. There is no evidence of new intracranial hemorrhage. No evidence of an ischemic infarct.  Vasogenic edema surrounding the right frontal parenchymal hematoma similar. Mass effect with partial effacement of the right lateral ventricle and 4.8 mm of left midline shift are without substantial change.  Visualized sinuses and mastoid air cells are clear.  IMPRESSION: 1. No change in the intracranial hemorrhage since the most recent prior study. Right frontal parenchymal hematoma is stable in size as is the degree of mass effect. Subarachnoid hemorrhage and areas of subdural hemorrhage are stable. No new intracranial hemorrhage. 2. Mass effect and midline shift ir stable. 3. No  hydrocephalus.   Electronically Signed   By: Lajean Manes M.D.   On: 11/20/2013 09:03   Ct Head Wo Contrast  11/29/2013   CLINICAL DATA:  Follow-up intracranial hemorrhage.  EXAM: CT HEAD WITHOUT CONTRAST  TECHNIQUE: Contiguous axial images were obtained from the base of the skull through the vertex without intravenous contrast.  COMPARISON:  CT of the head November 18, 2013  FINDINGS: The extensive subarachnoid blood products centered in the right sylvian fissure, extending into the anterior middle cranial fossa, and along the anterior falx. Similar appearance of 2.7 x 2.4 cm right insular intraparenchymal hematoma. Low density surrounding vasogenic edema is relatively unchanged. 5 mm right to left midline shift, partially effaced right lateral ventricle, no CT findings of entrapment or hydrocephalus. Trace subdural hematoma versus subarachnoid blood products along the posterior falx, and within the left quadrigeminal cistern.  No acute large vascular territory infarct. Moderate calcific atherosclerosis of the carotid siphons. No paranasal sinus air-fluid levels. The mastoid air cells are well aerated.  IMPRESSION: Similar extensive right greater than left subarachnoid blood, with a trace possible posterior falcine subdural hematoma.  Evolving 2.7 x 2.4 cm right insular hematoma with similar 5 mm right to left midline shift. No hydrocephalus.   Electronically Signed   By: Elon Alas   On: 11/14/2013 05:42   Ir Transcath/rx/inf Non Thrombolysis  11/20/2013  CLINICAL DATA:  Right-sided subarachnoid hemorrhage and intra cerebral hemorrhage.  EXAM: IR ANGIO INTRA EXTRACRAN SEL INTERNAL CAROTID UNI RIGHT MOD SED BILATERAL COMMON CAROTID ARTERY AND BILATERAL VERTEBRAL ARTERY ANGIOGRAMS FOLLOWED BY ENDOVASCULAR TREATMENT OF SEVERE VASOSPASM OF THE RIGHT MIDDLE CEREBRAL ARTERY BRANCHES USING INTRA-ARTERIAL VERAPAMIL.  ANESTHESIA/SEDATION: General.  MEDICATIONS: As per general anesthesia.  CONTRAST:  179m  OMNIPAQUE IOHEXOL 300 MG/ML  SOLN  PROCEDURE: Following a full explanation of the procedure along with the potential associated complications, an informed witnessed consent was obtained from the patient's sister.  The right groin was prepped and draped in the usual sterile fashion. Thereafter using modified Seldinger technique, transfemoral access into the right common femoral artery was obtained without difficulty. Over a 0.035 inch guidewire, a 5 French Pinnacle sheath was inserted. Through this, and also over a 0.035 inch guidewire, a 5 French JB1 catheter was advanced to the aortic arch region and selectively positioned in the right common carotid artery, the right subclavian artery, the left common carotid artery and the left vertebral artery.  There were no acute complications. The patient tolerated the procedure well.  COMPLICATIONS: None immediate.  FINDINGS: The right subclavian arteriogram demonstrates the origin of the right vertebral artery to be patent. 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  The right common carotid arteriogram demonstrates the right external carotid artery and its major branches to be normal.  The right internal carotid artery at the bulb has a smooth shallow plaque without associated stenosis.  No ulcerations are seen.  The vessel is, otherwise, seen to opacify normally to the cranial skull base. The petrous and the cavernous segments are widely patent. There is mild smooth narrowing of the supraclinoid segment. The right middle cerebral artery proximally extending to the right middle cerebral artery M2 M3 branches demonstrate significantly attenuated caliber throughout the distribution with delayed antegrade flow more distally compared to the right anterior cerebral artery distribution.  The right anterior cerebral A1 segment also demonstrates moderate to severe smooth  narrowing. There is mild mass-effect on the right A2 segment probably related to the underlying hematoma. A 3D rotational arteriogram was then performed centered over the right middle cerebral artery distribution. This failed to elucidate angiographic evidence of an intracranial aneurysm at this time.  The left common carotid arteriogram demonstrates the left external carotid artery and its major branches to be normal.  The left internal carotid artery at the bulb demonstrates a smooth atherosclerotic plaque along the lateral posterior wall with an associated narrowing of approximately 10 to 20% by the NASCET criteria.  No acute ulcerations are seen. The vessel is, otherwise, seen to opacify normally to the cranial skull base. The petrous, the cavernous and the supraclinoid segments are widely patent.  The left middle cerebral artery and the left anterior cerebral artery opacify normally into the capillary and venous phases.  There is cross-filling via the anterior communicating artery of the right anterior cerebral artery A2 segment.  Again demonstrated is a mild right to left midline shift of the A2 segments of both anterior cerebral arteries as described earlier.  Arising in the left MCA bifurcation region is a bilobed aneurysm projecting anteriorly. This measures approximately 7 mm x 6 mm, with a small superiorly projecting daughter aneurysm. The left vertebral artery origin is normal.  The vessel opacifies normally to the cranial skull base. The left vertebrobasilar junction and the left posterior inferior cerebellar  artery opacify normally. The basilar artery, the posterior cerebral arteries, the superior cerebellar arteries and the anterior inferior cerebellar arteries opacify normally into the capillary and venous phases.  Small bulbous configuration at the basilar artery apex probably represents confluence of the vessels arising from this region.  Anastomosis of the left anterior inferior cerebellar artery  with the left posterior-inferior cerebellar artery complex is seen. There is a stump of a right vertebrobasilar junction with no antegrade clearance consistent with a right vertebral artery occlusion as described earlier.  ENDOVASCULAR TREATMENT OF SEVERE VASOSPASM OF THE RIGHT MIDDLE CEREBRAL ARTERY AND THE RIGHT ANTERIOR CEREBRAL ARTERY:  The angiographic findings were discussed with the referring neurosurgeon.  It was decided to improve perfusion into the right cerebral hemisphere to avoid a potential catastrophic ischemic infarct. However, consideration of the fact that a possible aneurysm in the right middle cerebral artery distribution had not been entirely excluded just because it had not been angiographically demonstrated this time.  After discussion with the family it was elected to proceed with treatment of the right middle and the right anterior cerebral artery distribution vasospasm using intra-arterial verapamil.  Using biplane roadmap technique and constant fluoroscopic guidance, over a 0.035 inch Roadrunner guidewire, the 5 [place] JB1 catheter was advanced to the distal cervical segment of the right internal carotid artery. The guidewire was removed. Good aspiration was obtained from the hub of the 5 [place] diagnostic catheter. A control arteriogram performed showed no change in the intracranial circulation.  At this time approximately 11 mL of verapamil was then infused slowly via a pump over approximately 30 minutes. Throughout the procedure, the patient's blood pressure and heart rate remained stable.  At the end of this, a control arteriogram performed through the 5 [place] guide catheter in the right internal carotid artery demonstrated a modestly improved caliber and flow through the right middle and the right anterior cerebral artery distribution. No abnormal filling defects were seen.  As far as noted was the release of the conical configuration at the origin of the inferior division and also  the superior division of the right middle cerebral artery. An intracranial angioplasty was not performed in view of the unprotected nature of the etiology of the intracerebral and the subarachnoid hemorrhage.  The diagnostic catheter was then retrieved into the abdominal aorta and then removed. The 5 French Pinnacle sheath was then successfully retrieved with the application of an external closure device.  IMPRESSION: Severe vasospasm of the right middle cerebral artery distribution and moderate severe of the right anterior cerebral artery A1 segment with treatment and infusion intra-arterially of 11 mg of verapamil.  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   Electronically Signed   By: Luanne Bras M.D.   On: 11/20/2013 10:01   Ir 3d Primitivo Gauze Darreld Mclean  11/20/2013   CLINICAL DATA:  Right-sided subarachnoid hemorrhage and intra cerebral hemorrhage.  EXAM: IR ANGIO INTRA EXTRACRAN SEL INTERNAL CAROTID UNI RIGHT MOD SED BILATERAL COMMON CAROTID ARTERY AND BILATERAL VERTEBRAL ARTERY ANGIOGRAMS FOLLOWED BY ENDOVASCULAR TREATMENT OF SEVERE VASOSPASM OF THE RIGHT MIDDLE CEREBRAL ARTERY BRANCHES USING INTRA-ARTERIAL VERAPAMIL.  ANESTHESIA/SEDATION: General.  MEDICATIONS: As per general anesthesia.  CONTRAST:  152m OMNIPAQUE IOHEXOL 300 MG/ML  SOLN  PROCEDURE: Following a full explanation of the procedure along with the potential associated complications, an informed witnessed consent was obtained from the patient's sister.  The  right groin was prepped and draped in the usual sterile fashion. Thereafter using modified Seldinger technique, transfemoral access into the right common femoral artery was obtained without difficulty. Over a 0.035 inch guidewire, a 5 French Pinnacle sheath was inserted. Through this, and also over a 0.035 inch guidewire, a 5 [place] JB1 catheter was advanced to the  aortic arch region and selectively positioned in the right common carotid artery, the right subclavian artery, the left common carotid artery and the left vertebral artery.  There were no acute complications. The patient tolerated the procedure well.  COMPLICATIONS: None immediate.  FINDINGS: The right subclavian arteriogram demonstrates the origin of the right vertebral artery to be patent. 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  The right common carotid arteriogram demonstrates the right external carotid artery and its major branches to be normal.  The right internal carotid artery at the bulb has a smooth shallow plaque without associated stenosis.  No ulcerations are seen.  The vessel is, otherwise, seen to opacify normally to the cranial skull base. The petrous and the cavernous segments are widely patent. There is mild smooth narrowing of the supraclinoid segment. The right middle cerebral artery proximally extending to the right middle cerebral artery M2 M3 branches demonstrate significantly attenuated caliber throughout the distribution with delayed antegrade flow more distally compared to the right anterior cerebral artery distribution.  The right anterior cerebral A1 segment also demonstrates moderate to severe smooth narrowing. There is mild mass-effect on the right A2 segment probably related to the underlying hematoma. A 3D rotational arteriogram was then performed centered over the right middle cerebral artery distribution. This failed to elucidate angiographic evidence of an intracranial aneurysm at this time.  The left common carotid arteriogram demonstrates the left external carotid artery and its major branches to be normal.  The left internal carotid artery at the bulb demonstrates a smooth atherosclerotic plaque along the lateral posterior wall with an associated narrowing of  approximately 10 to 20% by the NASCET criteria.  No acute ulcerations are seen. The vessel is, otherwise, seen to opacify normally to the cranial skull base. The petrous, the cavernous and the supraclinoid segments are widely patent.  The left middle cerebral artery and the left anterior cerebral artery opacify normally into the capillary and venous phases.  There is cross-filling via the anterior communicating artery of the right anterior cerebral artery A2 segment.  Again demonstrated is a mild right to left midline shift of the A2 segments of both anterior cerebral arteries as described earlier.  Arising in the left MCA bifurcation region is a bilobed aneurysm projecting anteriorly. This measures approximately 7 mm x 6 mm, with a small superiorly projecting daughter aneurysm. The left vertebral artery origin is normal.  The vessel opacifies normally to the cranial skull base. The left vertebrobasilar junction and the left posterior inferior cerebellar artery opacify normally. The basilar artery, the posterior cerebral arteries, the superior cerebellar arteries and the anterior inferior cerebellar arteries opacify normally into the capillary and venous phases.  Small bulbous configuration at the basilar artery apex probably represents confluence of the vessels arising from this region.  Anastomosis of the left anterior inferior cerebellar artery with the left posterior-inferior cerebellar artery complex is seen. There is a stump of a right vertebrobasilar junction with no antegrade clearance consistent with a right vertebral artery occlusion as described earlier.  ENDOVASCULAR TREATMENT OF SEVERE VASOSPASM OF  THE RIGHT MIDDLE CEREBRAL ARTERY AND THE RIGHT ANTERIOR CEREBRAL ARTERY:  The angiographic findings were discussed with the referring neurosurgeon.  It was decided to improve perfusion into the right cerebral hemisphere to avoid a potential catastrophic ischemic infarct. However, consideration of the fact  that a possible aneurysm in the right middle cerebral artery distribution had not been entirely excluded just because it had not been angiographically demonstrated this time.  After discussion with the family it was elected to proceed with treatment of the right middle and the right anterior cerebral artery distribution vasospasm using intra-arterial verapamil.  Using biplane roadmap technique and constant fluoroscopic guidance, over a 0.035 inch Roadrunner guidewire, the 5 [place] JB1 catheter was advanced to the distal cervical segment of the right internal carotid artery. The guidewire was removed. Good aspiration was obtained from the hub of the 5 [place] diagnostic catheter. A control arteriogram performed showed no change in the intracranial circulation.  At this time approximately 11 mL of verapamil was then infused slowly via a pump over approximately 30 minutes. Throughout the procedure, the patient's blood pressure and heart rate remained stable.  At the end of this, a control arteriogram performed through the 5 [place] guide catheter in the right internal carotid artery demonstrated a modestly improved caliber and flow through the right middle and the right anterior cerebral artery distribution. No abnormal filling defects were seen.  As far as noted was the release of the conical configuration at the origin of the inferior division and also the superior division of the right middle cerebral artery. An intracranial angioplasty was not performed in view of the unprotected nature of the etiology of the intracerebral and the subarachnoid hemorrhage.  The diagnostic catheter was then retrieved into the abdominal aorta and then removed. The 5 French Pinnacle sheath was then successfully retrieved with the application of an external closure device.  IMPRESSION: Severe vasospasm of the right middle cerebral artery distribution and moderate severe of the right anterior cerebral artery A1 segment with treatment and  infusion intra-arterially of 11 mg of verapamil.  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   Electronically Signed   By: Luanne Bras M.D.   On: 11/20/2013 10:01   Dg Chest Port 1 View  11/20/2013   CLINICAL DATA:  Respiratory failure, followup  EXAM: PORTABLE CHEST - 1 VIEW  COMPARISON:  Portable chest x-ray 11/27/2013  FINDINGS: The tip of the endotracheal tube is approximately 3.0 cm above the carina. Opacity has increased somewhat of the left lung base most consistent with atelectasis with slight elevation of the left hemidiaphragm. Heart size is stable. NG tube remains.  IMPRESSION: Slight increase in left basilar opacity most consistent with atelectasis.   Electronically Signed   By: Ivar Drape M.D.   On: 11/20/2013 07:59   Dg Chest Port 1 View  11/26/2013   CLINICAL DATA:  Acute respiratory failure. On ventilator.  EXAM: PORTABLE CHEST - 1 VIEW  COMPARISON:  12/05/2013  FINDINGS: Endotracheal tube and nasogastric tube remain in appropriate position. Mild opacity in lateral left lung base shows no significant change. Lungs are otherwise clear. Heart size is within normal limits. No evidence of pneumothorax.  IMPRESSION: No significant change in mild opacity in the lateral left lung base.   Electronically Signed   By: Earle Gell M.D.   On: 12/12/2013 09:39   Ir  Angio Intra Extracran Sel Com Carotid Innominate Uni L Mod Sed  11/20/2013   CLINICAL DATA:  Right-sided subarachnoid hemorrhage and intra cerebral hemorrhage.  EXAM: IR ANGIO INTRA EXTRACRAN SEL INTERNAL CAROTID UNI RIGHT MOD SED BILATERAL COMMON CAROTID ARTERY AND BILATERAL VERTEBRAL ARTERY ANGIOGRAMS FOLLOWED BY ENDOVASCULAR TREATMENT OF SEVERE VASOSPASM OF THE RIGHT MIDDLE CEREBRAL ARTERY BRANCHES USING INTRA-ARTERIAL VERAPAMIL.  ANESTHESIA/SEDATION: General.  MEDICATIONS: As per general anesthesia.  CONTRAST:   155m OMNIPAQUE IOHEXOL 300 MG/ML  SOLN  PROCEDURE: Following a full explanation of the procedure along with the potential associated complications, an informed witnessed consent was obtained from the patient's sister.  The right groin was prepped and draped in the usual sterile fashion. Thereafter using modified Seldinger technique, transfemoral access into the right common femoral artery was obtained without difficulty. Over a 0.035 inch guidewire, a 5 French Pinnacle sheath was inserted. Through this, and also over a 0.035 inch guidewire, a 5 [place] JB1 catheter was advanced to the aortic arch region and selectively positioned in the right common carotid artery, the right subclavian artery, the left common carotid artery and the left vertebral artery.  There were no acute complications. The patient tolerated the procedure well.  COMPLICATIONS: None immediate.  FINDINGS: The right subclavian arteriogram demonstrates the origin of the right vertebral artery to be patent. 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  The right common carotid arteriogram demonstrates the right external carotid artery and its major branches to be normal.  The right internal carotid artery at the bulb has a smooth shallow plaque without associated stenosis.  No ulcerations are seen.  The vessel is, otherwise, seen to opacify normally to the cranial skull base. The petrous and the cavernous segments are widely patent. There is mild smooth narrowing of the supraclinoid segment. The right middle cerebral artery proximally extending to the right middle cerebral artery M2 M3 branches demonstrate significantly attenuated caliber throughout the distribution with delayed antegrade flow more distally compared to the right anterior cerebral artery distribution.  The right anterior cerebral A1 segment also demonstrates moderate to severe  smooth narrowing. There is mild mass-effect on the right A2 segment probably related to the underlying hematoma. A 3D rotational arteriogram was then performed centered over the right middle cerebral artery distribution. This failed to elucidate angiographic evidence of an intracranial aneurysm at this time.  The left common carotid arteriogram demonstrates the left external carotid artery and its major branches to be normal.  The left internal carotid artery at the bulb demonstrates a smooth atherosclerotic plaque along the lateral posterior wall with an associated narrowing of approximately 10 to 20% by the NASCET criteria.  No acute ulcerations are seen. The vessel is, otherwise, seen to opacify normally to the cranial skull base. The petrous, the cavernous and the supraclinoid segments are widely patent.  The left middle cerebral artery and the left anterior cerebral artery opacify normally into the capillary and venous phases.  There is cross-filling via the anterior communicating artery of the right anterior cerebral artery A2 segment.  Again demonstrated is a mild right to left midline shift of the A2 segments of both anterior cerebral arteries as described earlier.  Arising in the left MCA bifurcation region is a bilobed aneurysm projecting anteriorly. This measures approximately 7 mm x 6 mm, with a small superiorly projecting daughter aneurysm. The left vertebral artery origin is normal.  The vessel opacifies normally  to the cranial skull base. The left vertebrobasilar junction and the left posterior inferior cerebellar artery opacify normally. The basilar artery, the posterior cerebral arteries, the superior cerebellar arteries and the anterior inferior cerebellar arteries opacify normally into the capillary and venous phases.  Small bulbous configuration at the basilar artery apex probably represents confluence of the vessels arising from this region.  Anastomosis of the left anterior inferior cerebellar  artery with the left posterior-inferior cerebellar artery complex is seen. There is a stump of a right vertebrobasilar junction with no antegrade clearance consistent with a right vertebral artery occlusion as described earlier.  ENDOVASCULAR TREATMENT OF SEVERE VASOSPASM OF THE RIGHT MIDDLE CEREBRAL ARTERY AND THE RIGHT ANTERIOR CEREBRAL ARTERY:  The angiographic findings were discussed with the referring neurosurgeon.  It was decided to improve perfusion into the right cerebral hemisphere to avoid a potential catastrophic ischemic infarct. However, consideration of the fact that a possible aneurysm in the right middle cerebral artery distribution had not been entirely excluded just because it had not been angiographically demonstrated this time.  After discussion with the family it was elected to proceed with treatment of the right middle and the right anterior cerebral artery distribution vasospasm using intra-arterial verapamil.  Using biplane roadmap technique and constant fluoroscopic guidance, over a 0.035 inch Roadrunner guidewire, the 5 [place] JB1 catheter was advanced to the distal cervical segment of the right internal carotid artery. The guidewire was removed. Good aspiration was obtained from the hub of the 5 [place] diagnostic catheter. A control arteriogram performed showed no change in the intracranial circulation.  At this time approximately 11 mL of verapamil was then infused slowly via a pump over approximately 30 minutes. Throughout the procedure, the patient's blood pressure and heart rate remained stable.  At the end of this, a control arteriogram performed through the 5 [place] guide catheter in the right internal carotid artery demonstrated a modestly improved caliber and flow through the right middle and the right anterior cerebral artery distribution. No abnormal filling defects were seen.  As far as noted was the release of the conical configuration at the origin of the inferior division and  also the superior division of the right middle cerebral artery. An intracranial angioplasty was not performed in view of the unprotected nature of the etiology of the intracerebral and the subarachnoid hemorrhage.  The diagnostic catheter was then retrieved into the abdominal aorta and then removed. The 5 [place] Pinnacle sheath was then successfully retrieved with the application of an external closure device.  IMPRESSION: Severe vasospasm of the right middle cerebral artery distribution and moderate severe of the right anterior cerebral artery A1 segment with treatment and infusion intra-arterially of 11 mg of verapamil.  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   Electronically Signed   By: Luanne Bras M.D.   On: 11/20/2013 10:01   Ir Angio Intra Extracran Sel Internal Carotid Uni R Mod Sed  11/20/2013   CLINICAL DATA:  Right-sided subarachnoid hemorrhage and intra cerebral hemorrhage.  EXAM: IR ANGIO INTRA EXTRACRAN SEL INTERNAL CAROTID UNI RIGHT MOD SED BILATERAL COMMON CAROTID ARTERY AND BILATERAL VERTEBRAL ARTERY ANGIOGRAMS FOLLOWED BY ENDOVASCULAR TREATMENT OF SEVERE VASOSPASM OF THE RIGHT MIDDLE CEREBRAL ARTERY BRANCHES USING INTRA-ARTERIAL VERAPAMIL.  ANESTHESIA/SEDATION: General.  MEDICATIONS: As per general anesthesia.  CONTRAST:  129m OMNIPAQUE IOHEXOL 300 MG/ML  SOLN  PROCEDURE: Following a full  explanation of the procedure along with the potential associated complications, an informed witnessed consent was obtained from the patient's sister.  The right groin was prepped and draped in the usual sterile fashion. Thereafter using modified Seldinger technique, transfemoral access into the right common femoral artery was obtained without difficulty. Over a 0.035 inch guidewire, a 5 [place] Pinnacle sheath was inserted. Through this, and also over a 0.035 inch guidewire, a 5  [place] JB1 catheter was advanced to the aortic arch region and selectively positioned in the right common carotid artery, the right subclavian artery, the left common carotid artery and the left vertebral artery.  There were no acute complications. The patient tolerated the procedure well.  COMPLICATIONS: None immediate.  FINDINGS: The right subclavian arteriogram demonstrates the origin of the right vertebral artery to be patent. 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  The right common carotid arteriogram demonstrates the right external carotid artery and its major branches to be normal.  The right internal carotid artery at the bulb has a smooth shallow plaque without associated stenosis.  No ulcerations are seen.  The vessel is, otherwise, seen to opacify normally to the cranial skull base. The petrous and the cavernous segments are widely patent. There is mild smooth narrowing of the supraclinoid segment. The right middle cerebral artery proximally extending to the right middle cerebral artery M2 M3 branches demonstrate significantly attenuated caliber throughout the distribution with delayed antegrade flow more distally compared to the right anterior cerebral artery distribution.  The right anterior cerebral A1 segment also demonstrates moderate to severe smooth narrowing. There is mild mass-effect on the right A2 segment probably related to the underlying hematoma. A 3D rotational arteriogram was then performed centered over the right middle cerebral artery distribution. This failed to elucidate angiographic evidence of an intracranial aneurysm at this time.  The left common carotid arteriogram demonstrates the left external carotid artery and its major branches to be normal.  The left internal carotid artery at the bulb demonstrates a smooth atherosclerotic plaque along the lateral posterior wall  with an associated narrowing of approximately 10 to 20% by the NASCET criteria.  No acute ulcerations are seen. The vessel is, otherwise, seen to opacify normally to the cranial skull base. The petrous, the cavernous and the supraclinoid segments are widely patent.  The left middle cerebral artery and the left anterior cerebral artery opacify normally into the capillary and venous phases.  There is cross-filling via the anterior communicating artery of the right anterior cerebral artery A2 segment.  Again demonstrated is a mild right to left midline shift of the A2 segments of both anterior cerebral arteries as described earlier.  Arising in the left MCA bifurcation region is a bilobed aneurysm projecting anteriorly. This measures approximately 7 mm x 6 mm, with a small superiorly projecting daughter aneurysm. The left vertebral artery origin is normal.  The vessel opacifies normally to the cranial skull base. The left vertebrobasilar junction and the left posterior inferior cerebellar artery opacify normally. The basilar artery, the posterior cerebral arteries, the superior cerebellar arteries and the anterior inferior cerebellar arteries opacify normally into the capillary and venous phases.  Small bulbous configuration at the basilar artery apex probably represents confluence of the vessels arising from this region.  Anastomosis of the left anterior inferior cerebellar artery with the left posterior-inferior cerebellar artery complex is seen. There is a stump of a right vertebrobasilar  junction with no antegrade clearance consistent with a right vertebral artery occlusion as described earlier.  ENDOVASCULAR TREATMENT OF SEVERE VASOSPASM OF THE RIGHT MIDDLE CEREBRAL ARTERY AND THE RIGHT ANTERIOR CEREBRAL ARTERY:  The angiographic findings were discussed with the referring neurosurgeon.  It was decided to improve perfusion into the right cerebral hemisphere to avoid a potential catastrophic ischemic infarct.  However, consideration of the fact that a possible aneurysm in the right middle cerebral artery distribution had not been entirely excluded just because it had not been angiographically demonstrated this time.  After discussion with the family it was elected to proceed with treatment of the right middle and the right anterior cerebral artery distribution vasospasm using intra-arterial verapamil.  Using biplane roadmap technique and constant fluoroscopic guidance, over a 0.035 inch Roadrunner guidewire, the 5 [place] JB1 catheter was advanced to the distal cervical segment of the right internal carotid artery. The guidewire was removed. Good aspiration was obtained from the hub of the 5 [place] diagnostic catheter. A control arteriogram performed showed no change in the intracranial circulation.  At this time approximately 11 mL of verapamil was then infused slowly via a pump over approximately 30 minutes. Throughout the procedure, the patient's blood pressure and heart rate remained stable.  At the end of this, a control arteriogram performed through the 5 [place] guide catheter in the right internal carotid artery demonstrated a modestly improved caliber and flow through the right middle and the right anterior cerebral artery distribution. No abnormal filling defects were seen.  As far as noted was the release of the conical configuration at the origin of the inferior division and also the superior division of the right middle cerebral artery. An intracranial angioplasty was not performed in view of the unprotected nature of the etiology of the intracerebral and the subarachnoid hemorrhage.  The diagnostic catheter was then retrieved into the abdominal aorta and then removed. The 5 French Pinnacle sheath was then successfully retrieved with the application of an external closure device.  IMPRESSION: Severe vasospasm of the right middle cerebral artery distribution and moderate severe of the right anterior cerebral  artery A1 segment with treatment and infusion intra-arterially of 11 mg of verapamil.  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   Electronically Signed   By: Luanne Bras M.D.   On: 11/20/2013 10:01   Ir Angio Vertebral Sel Subclavian Innominate Uni R Mod Sed  11/20/2013   CLINICAL DATA:  Right-sided subarachnoid hemorrhage and intra cerebral hemorrhage.  EXAM: IR ANGIO INTRA EXTRACRAN SEL INTERNAL CAROTID UNI RIGHT MOD SED BILATERAL COMMON CAROTID ARTERY AND BILATERAL VERTEBRAL ARTERY ANGIOGRAMS FOLLOWED BY ENDOVASCULAR TREATMENT OF SEVERE VASOSPASM OF THE RIGHT MIDDLE CEREBRAL ARTERY BRANCHES USING INTRA-ARTERIAL VERAPAMIL.  ANESTHESIA/SEDATION: General.  MEDICATIONS: As per general anesthesia.  CONTRAST:  147m OMNIPAQUE IOHEXOL 300 MG/ML  SOLN  PROCEDURE: Following a full explanation of the procedure along with the potential associated complications, an informed witnessed consent was obtained from the patient's sister.  The right groin was prepped and draped in the usual sterile fashion. Thereafter using modified Seldinger technique, transfemoral access into the right common femoral artery was obtained without difficulty. Over a 0.035 inch guidewire, a 5 French Pinnacle sheath was inserted. Through this, and also over a 0.035 inch guidewire, a 5 [place] JB1 catheter was advanced to the aortic arch region and selectively positioned in the right  common carotid artery, the right subclavian artery, the left common carotid artery and the left vertebral artery.  There were no acute complications. The patient tolerated the procedure well.  COMPLICATIONS: None immediate.  FINDINGS: The right subclavian arteriogram demonstrates the origin of the right vertebral artery to be patent. However, there is significantly delayed hemodynamic antegrade flow with subsequent complete occlusion of the  right vertebral artery at the level of C2.  The right common carotid arteriogram demonstrates the right external carotid artery and its major branches to be normal.  The right internal carotid artery at the bulb has a smooth shallow plaque without associated stenosis.  No ulcerations are seen.  The vessel is, otherwise, seen to opacify normally to the cranial skull base. The petrous and the cavernous segments are widely patent. There is mild smooth narrowing of the supraclinoid segment. The right middle cerebral artery proximally extending to the right middle cerebral artery M2 M3 branches demonstrate significantly attenuated caliber throughout the distribution with delayed antegrade flow more distally compared to the right anterior cerebral artery distribution.  The right anterior cerebral A1 segment also demonstrates moderate to severe smooth narrowing. There is mild mass-effect on the right A2 segment probably related to the underlying hematoma. A 3D rotational arteriogram was then performed centered over the right middle cerebral artery distribution. This failed to elucidate angiographic evidence of an intracranial aneurysm at this time.  The left common carotid arteriogram demonstrates the left external carotid artery and its major branches to be normal.  The left internal carotid artery at the bulb demonstrates a smooth atherosclerotic plaque along the lateral posterior wall with an associated narrowing of approximately 10 to 20% by the NASCET criteria.  No acute ulcerations are seen. The vessel is, otherwise, seen to opacify normally to the cranial skull base. The petrous, the cavernous and the supraclinoid segments are widely patent.  The left middle cerebral artery and the left anterior cerebral artery opacify normally into the capillary and venous phases.  There is cross-filling via the anterior communicating artery of the right anterior cerebral artery A2 segment.  Again demonstrated is a mild right to  left midline shift of the A2 segments of both anterior cerebral arteries as described earlier.  Arising in the left MCA bifurcation region is a bilobed aneurysm projecting anteriorly. This measures approximately 7 mm x 6 mm, with a small superiorly projecting daughter aneurysm. The left vertebral artery origin is normal.  The vessel opacifies normally to the cranial skull base. The left vertebrobasilar junction and the left posterior inferior cerebellar artery opacify normally. The basilar artery, the posterior cerebral arteries, the superior cerebellar arteries and the anterior inferior cerebellar arteries opacify normally into the capillary and venous phases.  Small bulbous configuration at the basilar artery apex probably represents confluence of the vessels arising from this region.  Anastomosis of the left anterior inferior cerebellar artery with the left posterior-inferior cerebellar artery complex is seen. There is a stump of a right vertebrobasilar junction with no antegrade clearance consistent with a right vertebral artery occlusion as described earlier.  ENDOVASCULAR TREATMENT OF SEVERE VASOSPASM OF THE RIGHT MIDDLE CEREBRAL ARTERY AND THE RIGHT ANTERIOR CEREBRAL ARTERY:  The angiographic findings were discussed with the referring neurosurgeon.  It was decided to improve perfusion into the right cerebral hemisphere to avoid a potential catastrophic ischemic infarct. However, consideration of the fact that a possible aneurysm in the right middle cerebral artery distribution had not been entirely excluded just because it had not been  angiographically demonstrated this time.  After discussion with the family it was elected to proceed with treatment of the right middle and the right anterior cerebral artery distribution vasospasm using intra-arterial verapamil.  Using biplane roadmap technique and constant fluoroscopic guidance, over a 0.035 inch Roadrunner guidewire, the 5 [place] JB1 catheter was advanced to  the distal cervical segment of the right internal carotid artery. The guidewire was removed. Good aspiration was obtained from the hub of the 5 [place] diagnostic catheter. A control arteriogram performed showed no change in the intracranial circulation.  At this time approximately 11 mL of verapamil was then infused slowly via a pump over approximately 30 minutes. Throughout the procedure, the patient's blood pressure and heart rate remained stable.  At the end of this, a control arteriogram performed through the 5 [place] guide catheter in the right internal carotid artery demonstrated a modestly improved caliber and flow through the right middle and the right anterior cerebral artery distribution. No abnormal filling defects were seen.  As far as noted was the release of the conical configuration at the origin of the inferior division and also the superior division of the right middle cerebral artery. An intracranial angioplasty was not performed in view of the unprotected nature of the etiology of the intracerebral and the subarachnoid hemorrhage.  The diagnostic catheter was then retrieved into the abdominal aorta and then removed. The 5 French Pinnacle sheath was then successfully retrieved with the application of an external closure device.  IMPRESSION: Severe vasospasm of the right middle cerebral artery distribution and moderate severe of the right anterior cerebral artery A1 segment with treatment and infusion intra-arterially of 11 mg of verapamil.  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   Electronically Signed   By: Luanne Bras M.D.   On: 11/20/2013 10:01   Ir Angio Vertebral Sel Vertebral Uni L Mod Sed  11/20/2013   CLINICAL DATA:  Right-sided subarachnoid hemorrhage and intra cerebral hemorrhage.  EXAM: IR ANGIO INTRA EXTRACRAN SEL INTERNAL CAROTID UNI RIGHT MOD SED  BILATERAL COMMON CAROTID ARTERY AND BILATERAL VERTEBRAL ARTERY ANGIOGRAMS FOLLOWED BY ENDOVASCULAR TREATMENT OF SEVERE VASOSPASM OF THE RIGHT MIDDLE CEREBRAL ARTERY BRANCHES USING INTRA-ARTERIAL VERAPAMIL.  ANESTHESIA/SEDATION: General.  MEDICATIONS: As per general anesthesia.  CONTRAST:  135m OMNIPAQUE IOHEXOL 300 MG/ML  SOLN  PROCEDURE: Following a full explanation of the procedure along with the potential associated complications, an informed witnessed consent was obtained from the patient's sister.  The right groin was prepped and draped in the usual sterile fashion. Thereafter using modified Seldinger technique, transfemoral access into the right common femoral artery was obtained without difficulty. Over a 0.035 inch guidewire, a 5 French Pinnacle sheath was inserted. Through this, and also over a 0.035 inch guidewire, a 5 [place] JB1 catheter was advanced to the aortic arch region and selectively positioned in the right common carotid artery, the right subclavian artery, the left common carotid artery and the left vertebral artery.  There were no acute complications. The patient tolerated the procedure well.  COMPLICATIONS: None immediate.  FINDINGS: The right subclavian arteriogram demonstrates the origin of the right vertebral artery to be patent. 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  The right common carotid arteriogram demonstrates the right external carotid artery and its major branches to be  normal.  The right internal carotid artery at the bulb has a smooth shallow plaque without associated stenosis.  No ulcerations are seen.  The vessel is, otherwise, seen to opacify normally to the cranial skull base. The petrous and the cavernous segments are widely patent. There is mild smooth narrowing of the supraclinoid segment. The right middle cerebral artery proximally extending to the right  middle cerebral artery M2 M3 branches demonstrate significantly attenuated caliber throughout the distribution with delayed antegrade flow more distally compared to the right anterior cerebral artery distribution.  The right anterior cerebral A1 segment also demonstrates moderate to severe smooth narrowing. There is mild mass-effect on the right A2 segment probably related to the underlying hematoma. A 3D rotational arteriogram was then performed centered over the right middle cerebral artery distribution. This failed to elucidate angiographic evidence of an intracranial aneurysm at this time.  The left common carotid arteriogram demonstrates the left external carotid artery and its major branches to be normal.  The left internal carotid artery at the bulb demonstrates a smooth atherosclerotic plaque along the lateral posterior wall with an associated narrowing of approximately 10 to 20% by the NASCET criteria.  No acute ulcerations are seen. The vessel is, otherwise, seen to opacify normally to the cranial skull base. The petrous, the cavernous and the supraclinoid segments are widely patent.  The left middle cerebral artery and the left anterior cerebral artery opacify normally into the capillary and venous phases.  There is cross-filling via the anterior communicating artery of the right anterior cerebral artery A2 segment.  Again demonstrated is a mild right to left midline shift of the A2 segments of both anterior cerebral arteries as described earlier.  Arising in the left MCA bifurcation region is a bilobed aneurysm projecting anteriorly. This measures approximately 7 mm x 6 mm, with a small superiorly projecting daughter aneurysm. The left vertebral artery origin is normal.  The vessel opacifies normally to the cranial skull base. The left vertebrobasilar junction and the left posterior inferior cerebellar artery opacify normally. The basilar artery, the posterior cerebral arteries, the superior cerebellar  arteries and the anterior inferior cerebellar arteries opacify normally into the capillary and venous phases.  Small bulbous configuration at the basilar artery apex probably represents confluence of the vessels arising from this region.  Anastomosis of the left anterior inferior cerebellar artery with the left posterior-inferior cerebellar artery complex is seen. There is a stump of a right vertebrobasilar junction with no antegrade clearance consistent with a right vertebral artery occlusion as described earlier.  ENDOVASCULAR TREATMENT OF SEVERE VASOSPASM OF THE RIGHT MIDDLE CEREBRAL ARTERY AND THE RIGHT ANTERIOR CEREBRAL ARTERY:  The angiographic findings were discussed with the referring neurosurgeon.  It was decided to improve perfusion into the right cerebral hemisphere to avoid a potential catastrophic ischemic infarct. However, consideration of the fact that a possible aneurysm in the right middle cerebral artery distribution had not been entirely excluded just because it had not been angiographically demonstrated this time.  After discussion with the family it was elected to proceed with treatment of the right middle and the right anterior cerebral artery distribution vasospasm using intra-arterial verapamil.  Using biplane roadmap technique and constant fluoroscopic guidance, over a 0.035 inch Roadrunner guidewire, the 5 [place] JB1 catheter was advanced to the distal cervical segment of the right internal carotid artery. The guidewire was removed. Good aspiration was obtained from the hub of the 5 [place] diagnostic catheter. A control arteriogram performed showed no change in  the intracranial circulation.  At this time approximately 11 mL of verapamil was then infused slowly via a pump over approximately 30 minutes. Throughout the procedure, the patient's blood pressure and heart rate remained stable.  At the end of this, a control arteriogram performed through the 5 [place] guide catheter in the right  internal carotid artery demonstrated a modestly improved caliber and flow through the right middle and the right anterior cerebral artery distribution. No abnormal filling defects were seen.  As far as noted was the release of the conical configuration at the origin of the inferior division and also the superior division of the right middle cerebral artery. An intracranial angioplasty was not performed in view of the unprotected nature of the etiology of the intracerebral and the subarachnoid hemorrhage.  The diagnostic catheter was then retrieved into the abdominal aorta and then removed. The 5 French Pinnacle sheath was then successfully retrieved with the application of an external closure device.  IMPRESSION: Severe vasospasm of the right middle cerebral artery distribution and moderate severe of the right anterior cerebral artery A1 segment with treatment and infusion intra-arterially of 11 mg of verapamil.  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   Electronically Signed   By: Luanne Bras M.D.   On: 11/20/2013 10:01    Assessment: 63 y.o. 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 She has surrounding cytotoxic edema and midline shift and is employed left MCA aneurysm as well.     Plan:  Patient is critically ill and at significant risk for neurological worsening vasospasm, cerebral ischemia, developing hydrocephalus. Continued present ICU level care with strict control of blood pressure adequate hydration. Continue routine and checking transcanal Doppler studies Mondays, Wednesdays and Fridays. Suggest repeat cerebral catheter angiogram in 7 days to re\re evaluate for right MCA  aneurysm not seen on the initial angiogram and treatment for that as necessary. Check MRI scan of the brain and as well as increasing cerebral edema and right-to-left midline shift may need to consider craniotomy versus hypertonic saline to cerebral edema. DVT and GI prophylaxis. Keppra to Depakote to help with headache as well as seizure prophylaxis. I had a long discussion With Dr. Colbert Coyer and Dr.Nundkumar who agree with the plan I spent 40 minutes of neuro critical care time in the care of this patient  Antony Contras, MD 11/20/2013, 1:48 PM

## 2013-11-20 NOTE — Consult Note (Signed)
STROKE TEAM PROGRESS NOTE   HISTORY 63 y.o. 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 She has surrounding cytotoxic edema and midline shift and is employed left MCA aneurysm as well.     SUBJECTIVE (INTERVAL HISTORY)  Patient has neurological remained stable overnight. She is awake and following commands. Left upper extremity strength seems to have declined though she can move the left lower extremity well. Her blood pressure has been adequately controlled. She is not complaining of headache this a.m. Repeat CT head this a.m. shows stable appearance of the right insular hematoma with subarachnoid hemorrhage and vasogenic edema with 4.4 mm right-to-left midline shift TCD done today is again suboptimal with absent bitemporal windows  OBJECTIVE Temp:  [98 F (36.7 C)-99.5 F (37.5 C)] 98 F (36.7 C) (09/09 0757) Pulse Rate:  [47-74] 53 (09/09 1100) Cardiac Rhythm:  [-] Sinus bradycardia (09/09 0800) Resp:  [13-28] 17 (09/09 1100) BP: (111-178)/(48-90) 157/88 mmHg (09/09 1100) SpO2:  [98 %-100 %] 100 % (09/09 1100) Arterial Line BP: (81-169)/(42-70) 168/66 mmHg (09/09 1100) FiO2 (%):  [30 %] 30 % (09/09 0327)   Recent Labs Lab 11/15/2013 1900 11/14/2013 0245 11/20/13 0550 11/21/13 0500  NA 132* 128* 131* 131*  K 3.6* 3.7 3.2* 2.7*  CL 93* 92* 96 95*  CO2 23 20 22 22   GLUCOSE 142* 148* 109* 124*  BUN 10 11 10 9   CREATININE 0.99 0.98 0.82 0.72  CALCIUM 8.5 8.0* 7.2* 7.0*  MG 1.0*  --   --   --     Recent Labs Lab 11/23/2013 1900 11/20/13 0550  AST 74* 49*  ALT 37* 24  ALKPHOS 122* 86  BILITOT 1.2 0.9  PROT 6.8 5.8*  ALBUMIN 3.3* 2.6*    Recent Labs Lab 11/16/2013 2345 12/03/2013 0245 11/20/13 0550 11/21/13 0500  WBC  --  7.1 8.0 7.8  NEUTROABS  --   --  6.2  --   HGB  --  11.1*  9.6* 9.9*  HCT  --  32.0* 28.0* 28.6*  MCV  --  100.3* 99.6 99.3  PLT 205 186 248 215    Recent Labs  11/22/2013 1900  LABPROT 12.5  INR 0.93       Component Value Date/Time   CHOL 118 05/18/2012 0512   TRIG 90 11/21/2013 1900   HDL 66 05/18/2012 0512   CHOLHDL 1.8 05/18/2012 0512   VLDL 14 05/18/2012 0512   LDLCALC 38 05/18/2012 0512    Ct Angio Head W/cm &/or Wo Cm 12/10/2013    1. Unchanged appearance of extensive, predominantly right-sided subarachnoid hemorrhage as well as 2.7 cm hematoma in the region of the right insula. Moderate surrounding edema with 5 mm of unchanged midline shift. 2. No right MCA aneurysm is identified, however there is severe attenuation of the right MCA and its branch vessels which may reflect local mass effect and vasospasm secondary to surrounding hemorrhage, and this could obscure an underlying right MCA aneurysm. Narrowing of the ACAs and right ICA terminus also may reflect vasospasm. Catheter angiography may be helpful for further evaluation. 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    Dg Chest 1 View 11/21/2013 Improved aeration of the left lung base with mild basilar atelectasis remaining 11/20/2013   Slight increase in left basilar opacity most consistent with atelectasis.  12/10/2013   No significant change in mild opacity in the lateral left lung base.    12/05/2013   Endotracheal tube and nasogastric tube in appropriate position.  Stable mild opacity in lateral left lung base.     Ct Head Wo Contrast 11/21/2013 1. No significant interval change in intraparenchymal, subarachnoid, and subdural hemorrhage as compared to 11/20/13. Associated vasogenic edema is similar with stable 4-5 mm of right-to-left midline shift. No hydrocephalus. 2. No new hemorrhage.   11/20/2013    1. No change in the intracranial hemorrhage since the most recent prior study. Right frontal parenchymal hematoma is stable in size as is the degree of mass effect.  Subarachnoid hemorrhage and areas of subdural hemorrhage are stable. No new intracranial hemorrhage. 2. Mass effect and midline shift ir stable. 3. No hydrocephalus.   12/10/2013    Similar extensive right greater than left subarachnoid blood, with a trace possible posterior falcine subdural hematoma.  Evolving 2.7 x 2.4 cm right insular hematoma with similar 5 mm right to left midline shift. No hydrocephalus.     TCD 12/10/2013 Limited study due to poor windows and neck strap limiting evaluation. Low normal mean flow velocities in identified vessels of anterior circulation. Globally elevated pulsatility indices suggest increase intracranial presure or diffuse intracranial atherosclerosis.    PHYSICAL EXAM Filed Vitals:   11/21/13 1300  BP: 164/78  Pulse: 62  Temp:   Resp: 43  [name] intubated. Afebrile. Head is nontraumatic. Neck is supple without bruit. l. Cardiac exam no murmur or gallop. Lungs are clear to auscultation. Distal pulses are well felt.  Neurologic Examination:  Awake and follows simple midline and one-step commands. Pupils are equal and reactive. Fundi were not visualized. Vision acuity cannot be reliably tested. Right gaze preference but able to look to the left past midline but not all the way. Blinks to threat on the right but not as well on the left. Left lower facial weakness. Tongue midline. Able to move all 4 extremities but right side more than left. Has significant weakness in LUE  t with mild weakness of left   hip flexors and 3/5 LLE strength. Deep tendon reflexes are 2+ symmetric. Right plantar is downgoing left is equivocal. Withdraws to painful stimuli not 4 extremities. Coordination and gait cannot be tested    ASSESSMENT/PLAN Carla Hoover is a 63 y.o. female with history of CHF, depression, elevated cholesterol, COPD, MI, and CAD on aspirin and plavix s/p fall where she hit her head. Presenting to [facility] 11/26/2013 with left  sided weakness, facial droop and headache. Intubated there and transferred to [facility]. Imaging confirms a right SAH, R IPH, no acute infarct.   Hemorrhage:  R insular IPH with R perisylvian SAH & R SDH likely from R MCA aneurysm not seen on angio due to severe vasospasm with vasogenic edema and mass effect   Respiratory failure secondary to hemorhhage, intubated at [facility],     aspirin 81 mg orally every day and clopidogrel 75 mg orally every day prior to admission,   SCDs for VTE prophylaxis  NPO   Bedrest  Resultant  Left hemiplegia  Therapy needs:  Pending  Ongoing aggressive risk factor management  Disposition:  pending  Hypertension Ischemic Cardiomyopathy   Home meds:  Lasix, lopressor, imdur.  BP 98-178/53-90 past 24h (11/21/2013 @ 11:24 AM)  SBP goal below 180  Unstable  Hyperlipidemia  Home meds:  pravachol.   Other Stroke Risk Factors Advanced age  Cigarette smoker, advised to stop smoking ETOH use, etiology of fall.   Coronary artery disease - MI, angioplasty 2013  Ischemic cardiomyopathy  Other Active Problems     Other Pertinent History  AAA, infrarenal  R hepatic lobe lesion  Hospital day # 3  This patient is critically ill and at significant risk of neurological worsening, death and care requires constant monitoring of vital signs, hemodynamics,respiratory and cardiac monitoring,review of multiple databases, neurological assessment, discussion with family, other specialists and medical decision making of high complexity.I have made any additions or clarifications directly to the above note.  I spent 30 minutes of neurocritical care time  in the care of  this patient. Agree with increasing hydration and liberalizing blood pressure goal  to decrease vasospasm and continued nimodipine and TCD. Unfortunately she has poor windows which limits this evaluation. Extubate  per CCM  Antony Contras, MD  To contact Stroke Continuity provider, please refer to  [email or web address] After hours, contact General Neurology

## 2013-11-20 NOTE — Progress Notes (Signed)
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V.:166.2; NG/GT:190; IV Piggyback:100] Out: 525 [Urine:525]  PE:  Afeb; vss On vent Semi alert Can focus on me Follows my fingers with eyes Rt gaze is wnl; no L gaze--cannot pass midline Moves Rt hand and leg to command Moves Left leg to command No movement on Left hand/arm Can nod yes/no- appropriate Rt groin NT; no bleeding; no hematoma Rt foot 2+ pulses H/H: 9.6/28 (11.2/32) Bun/Cr stable   Lab Results:   Recent Labs  12/03/2013 0245 11/20/13 0550  WBC 7.1 8.0  HGB 11.1* 9.6*  HCT 32.0* 28.0*  PLT 186 248   BMET  Recent Labs  11/22/2013 0245 11/20/13 0550  NA 128* 131*  K 3.7 3.2*  CL 92* 96  CO2 20 22  GLUCOSE 148* 109*  BUN 11 10  CREATININE 0.98 0.82  CALCIUM 8.0* 7.2*   PT/INR  Recent Labs  12/07/2013 1900  LABPROT 12.5  INR 0.93   ABG  Recent Labs  12/10/2013 2014  PHART 7.425  HCO3 22.0    Studies/Results: Ct Angio Head W/cm &/or Wo Cm  11/16/2013   CLINICAL DATA:  Subarachnoid hemorrhage.  Evaluate for aneurysm.  EXAM: CT ANGIOGRAPHY HEAD  TECHNIQUE: Multidetector CT imaging of the head was performed using the standard protocol during bolus administration of intravenous contrast. Multiplanar CT image reconstructions and MIPs were obtained to evaluate the vascular anatomy.   CONTRAST:  46mL OMNIPAQUE IOHEXOL 350 MG/ML SOLN  COMPARISON:  Head CT 11/17/2013  FINDINGS: Extensive acute subarachnoid hemorrhage is again seen, greatest in the right sylvian fissure, right cerebral hemispheric sulci, and along the falx with some hemorrhage in the basilar cisterns. Overall volume of hemorrhage has not significantly changed. 2.7 cm hematoma centered in the region of the right insula has also not significantly changed in size with moderate surrounding edema. 5 mm of leftward midline shift and partial effacement of the right lateral ventricle are unchanged. There is no hydrocephalus. No gross abnormal enhancement is identified. Orbits are unremarkable. Mastoid air cells and visualized paranasal sinuses are clear.  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 Visualized distal left vertebral artery is patent and supplies the basilar. Left PICA origin is patent. Right PICA appears patent and may arise from a common trunk with the right AICA. SCA origins are patent. Basilar artery is patent without stenosis. Basilar tip is slightly bulbous without discrete aneurysm. There is minimal bilateral PCA irregularity without evidence of significant stenosis. Posterior communicating arteries are not identified.  Internal carotid arteries are patent from skullbase to carotid termini. Moderate carotid siphon atherosclerotic calcification is present bilaterally with mild bilateral carotid siphon narrowing. There is moderate, diffuse narrowing of the right supraclinoid ICA. There is marked irregular narrowing of the proximal right MCA with only trace intermittent opacification of more distal right MCA branches in the area of extensive subarachnoid  hemorrhage in the sylvian fissure. No opacified right MCA aneurysm is identified.  The right A1 segment is also moderately narrowed. There is also moderate irregular narrowing of the bilateral A2 and more distal ACA branch vessels. The left M1 segment is patent. There is a  complex 7 x 6 mm aneurysm projecting laterally from the left MCA bifurcation. There is a 3 mm small outpouching projecting superiorly from the aneurysm. The aneurysm involves M2 trunk origins.  Review of the MIP images confirms the above findings.  IMPRESSION: 1. Unchanged appearance of extensive, predominantly right-sided subarachnoid hemorrhage as well as 2.7 cm hematoma in the region of the right insula. Moderate surrounding edema with 5 mm of unchanged midline shift. 2. No right MCA aneurysm is identified, however there is severe attenuation of the right MCA and its branch vessels which may reflect local mass effect and vasospasm secondary to surrounding hemorrhage, and this could obscure an underlying right MCA aneurysm. Narrowing of the ACAs and right ICA terminus also may reflect vasospasm. Catheter angiography may be helpful for further evaluation. 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  These results were called by telephone at the time of interpretation on 11/17/2013 at 9:55 pm to Dr. Erline Levine , who verbally acknowledged these results.   Electronically Signed   By: Logan Bores   On: 12/11/2013 22:07   Dg Chest 1 View  12/07/2013   CLINICAL DATA:  Acute respiratory failure.  Intubation.  EXAM: CHEST - 1 VIEW  COMPARISON:  Prior today on 11/14/2013  FINDINGS: Endotracheal tube remains in appropriate position. A new nasogastric tube is seen entering stomach.  Heart size is stable. Mild opacity in the lateral left lung base is stable. No evidence of pneumothorax or pleural effusion.  IMPRESSION: Endotracheal tube and nasogastric tube in appropriate position.  Stable mild opacity in lateral left lung base.   Electronically Signed   By: Earle Gell M.D.   On: 12/01/2013 21:18   Ct Head Wo Contrast  11/20/2013   CLINICAL DATA:  Followup intracranial hemorrhage.  EXAM: CT HEAD WITHOUT CONTRAST  TECHNIQUE: Contiguous axial images were obtained from the base of the skull  through the vertex without intravenous contrast.  COMPARISON:  12/01/2013.  FINDINGS: Right frontal parenchymal hematoma is unchanged in size. Hemorrhage extends to the subarachnoid space over the right frontal, parietal and temporal lobes. Subdural hemorrhage tracks along the interhemispheric fissure. Subarachnoid hemorrhage in the posterior fossa is evident along the right pre pontine cistern extending toward the right cerebellopontine ankle, and in the left quadrigeminal plate cistern. There is no evidence of new intracranial hemorrhage. No evidence of an ischemic infarct.  Vasogenic edema surrounding the right frontal parenchymal hematoma similar. Mass effect with partial effacement of the right lateral ventricle and 4.8 mm of left midline shift are without substantial change.  Visualized sinuses and mastoid air cells are clear.  IMPRESSION: 1. No change in the intracranial hemorrhage since the most recent prior study. Right frontal parenchymal hematoma is stable in size as is the degree of mass effect. Subarachnoid hemorrhage and areas of subdural hemorrhage are stable. No new intracranial hemorrhage. 2. Mass effect and midline shift ir stable. 3. No hydrocephalus.   Electronically Signed   By: Lajean Manes M.D.   On: 11/20/2013 09:03   Ct Head Wo Contrast  11/20/2013   CLINICAL DATA:  Follow-up intracranial hemorrhage.  EXAM: CT HEAD WITHOUT CONTRAST  TECHNIQUE: Contiguous axial images were obtained from the  base of the skull through the vertex without intravenous contrast.  COMPARISON:  CT of the head November 18, 2013  FINDINGS: The extensive subarachnoid blood products centered in the right sylvian fissure, extending into the anterior middle cranial fossa, and along the anterior falx. Similar appearance of 2.7 x 2.4 cm right insular intraparenchymal hematoma. Low density surrounding vasogenic edema is relatively unchanged. 5 mm right to left midline shift, partially effaced right lateral ventricle, no CT  findings of entrapment or hydrocephalus. Trace subdural hematoma versus subarachnoid blood products along the posterior falx, and within the left quadrigeminal cistern.  No acute large vascular territory infarct. Moderate calcific atherosclerosis of the carotid siphons. No paranasal sinus air-fluid levels. The mastoid air cells are well aerated.  IMPRESSION: Similar extensive right greater than left subarachnoid blood, with a trace possible posterior falcine subdural hematoma.  Evolving 2.7 x 2.4 cm right insular hematoma with similar 5 mm right to left midline shift. No hydrocephalus.   Electronically Signed   By: Elon Alas   On: 11/25/2013 05:42   Ir Transcath/rx/inf Non Thrombolysis  11/20/2013   CLINICAL DATA:  Right-sided subarachnoid hemorrhage and intra cerebral hemorrhage.  EXAM: IR ANGIO INTRA EXTRACRAN SEL INTERNAL CAROTID UNI RIGHT MOD SED BILATERAL COMMON CAROTID ARTERY AND BILATERAL VERTEBRAL ARTERY ANGIOGRAMS FOLLOWED BY ENDOVASCULAR TREATMENT OF SEVERE VASOSPASM OF THE RIGHT MIDDLE CEREBRAL ARTERY BRANCHES USING INTRA-ARTERIAL VERAPAMIL.  ANESTHESIA/SEDATION: General.  MEDICATIONS: As per general anesthesia.  CONTRAST:  151mL OMNIPAQUE IOHEXOL 300 MG/ML  SOLN  PROCEDURE: Following a full explanation of the procedure along with the potential associated complications, an informed witnessed consent was obtained from the patient's sister.  The right groin was prepped and draped in the usual sterile fashion. Thereafter using modified Seldinger technique, transfemoral access into the right common femoral artery was obtained without difficulty. Over a 0.035 inch guidewire, a 5 French Pinnacle sheath was inserted. Through this, and also over a 0.035 inch guidewire, a 5 French JB1 catheter was advanced to the aortic arch region and selectively positioned in the right common carotid artery, the right subclavian artery, the left common carotid artery and the left vertebral artery.  There were no acute  complications. The patient tolerated the procedure well.  COMPLICATIONS: None immediate.  FINDINGS: The right subclavian arteriogram demonstrates the origin of the right vertebral artery to be patent. 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  The right common carotid arteriogram demonstrates the right external carotid artery and its major branches to be normal.  The right internal carotid artery at the bulb has a smooth shallow plaque without associated stenosis.  No ulcerations are seen.  The vessel is, otherwise, seen to opacify normally to the cranial skull base. The petrous and the cavernous segments are widely patent. There is mild smooth narrowing of the supraclinoid segment. The right middle cerebral artery proximally extending to the right middle cerebral artery M2 M3 branches demonstrate significantly attenuated caliber throughout the distribution with delayed antegrade flow more distally compared to the right anterior cerebral artery distribution.  The right anterior cerebral A1 segment also demonstrates moderate to severe smooth narrowing. There is mild mass-effect on the right A2 segment probably related to the underlying hematoma. A 3D rotational arteriogram was then performed centered over the right middle cerebral artery distribution. This failed to elucidate angiographic evidence of an intracranial aneurysm at this time.  The left common carotid arteriogram demonstrates the  left external carotid artery and its major branches to be normal.  The left internal carotid artery at the bulb demonstrates a smooth atherosclerotic plaque along the lateral posterior wall with an associated narrowing of approximately 10 to 20% by the NASCET criteria.  No acute ulcerations are seen. The vessel is, otherwise, seen to opacify normally to the cranial skull base. The petrous, the cavernous and the  supraclinoid segments are widely patent.  The left middle cerebral artery and the left anterior cerebral artery opacify normally into the capillary and venous phases.  There is cross-filling via the anterior communicating artery of the right anterior cerebral artery A2 segment.  Again demonstrated is a mild right to left midline shift of the A2 segments of both anterior cerebral arteries as described earlier.  Arising in the left MCA bifurcation region is a bilobed aneurysm projecting anteriorly. This measures approximately 7 mm x 6 mm, with a small superiorly projecting daughter aneurysm. The left vertebral artery origin is normal.  The vessel opacifies normally to the cranial skull base. The left vertebrobasilar junction and the left posterior inferior cerebellar artery opacify normally. The basilar artery, the posterior cerebral arteries, the superior cerebellar arteries and the anterior inferior cerebellar arteries opacify normally into the capillary and venous phases.  Small bulbous configuration at the basilar artery apex probably represents confluence of the vessels arising from this region.  Anastomosis of the left anterior inferior cerebellar artery with the left posterior-inferior cerebellar artery complex is seen. There is a stump of a right vertebrobasilar junction with no antegrade clearance consistent with a right vertebral artery occlusion as described earlier.  ENDOVASCULAR TREATMENT OF SEVERE VASOSPASM OF THE RIGHT MIDDLE CEREBRAL ARTERY AND THE RIGHT ANTERIOR CEREBRAL ARTERY:  The angiographic findings were discussed with the referring neurosurgeon.  It was decided to improve perfusion into the right cerebral hemisphere to avoid a potential catastrophic ischemic infarct. However, consideration of the fact that a possible aneurysm in the right middle cerebral artery distribution had not been entirely excluded just because it had not been angiographically demonstrated this time.  After discussion  with the family it was elected to proceed with treatment of the right middle and the right anterior cerebral artery distribution vasospasm using intra-arterial verapamil.  Using biplane roadmap technique and constant fluoroscopic guidance, over a 0.035 inch Roadrunner guidewire, the 5 [place] JB1 catheter was advanced to the distal cervical segment of the right internal carotid artery. The guidewire was removed. Good aspiration was obtained from the hub of the 5 [place] diagnostic catheter. A control arteriogram performed showed no change in the intracranial circulation.  At this time approximately 11 mL of verapamil was then infused slowly via a pump over approximately 30 minutes. Throughout the procedure, the patient's blood pressure and heart rate remained stable.  At the end of this, a control arteriogram performed through the 5 [place] guide catheter in the right internal carotid artery demonstrated a modestly improved caliber and flow through the right middle and the right anterior cerebral artery distribution. No abnormal filling defects were seen.  As far as noted was the release of the conical configuration at the origin of the inferior division and also the superior division of the right middle cerebral artery. An intracranial angioplasty was not performed in view of the unprotected nature of the etiology of the intracerebral and the subarachnoid hemorrhage.  The diagnostic catheter was then retrieved into the abdominal aorta and then removed. The 5 French Pinnacle sheath was then successfully retrieved with  the application of an external closure device.  IMPRESSION: Severe vasospasm of the right middle cerebral artery distribution and moderate severe of the right anterior cerebral artery A1 segment with treatment and infusion intra-arterially of 11 mg of verapamil.  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   Electronically Signed   By: Luanne Bras M.D.   On: 11/20/2013 10:01   Ir 3d Primitivo Gauze Darreld Mclean  11/20/2013   CLINICAL DATA:  Right-sided subarachnoid hemorrhage and intra cerebral hemorrhage.  EXAM: IR ANGIO INTRA EXTRACRAN SEL INTERNAL CAROTID UNI RIGHT MOD SED BILATERAL COMMON CAROTID ARTERY AND BILATERAL VERTEBRAL ARTERY ANGIOGRAMS FOLLOWED BY ENDOVASCULAR TREATMENT OF SEVERE VASOSPASM OF THE RIGHT MIDDLE CEREBRAL ARTERY BRANCHES USING INTRA-ARTERIAL VERAPAMIL.  ANESTHESIA/SEDATION: General.  MEDICATIONS: As per general anesthesia.  CONTRAST:  130mL OMNIPAQUE IOHEXOL 300 MG/ML  SOLN  PROCEDURE: Following a full explanation of the procedure along with the potential associated complications, an informed witnessed consent was obtained from the patient's sister.  The right groin was prepped and draped in the usual sterile fashion. Thereafter using modified Seldinger technique, transfemoral access into the right common femoral artery was obtained without difficulty. Over a 0.035 inch guidewire, a 5 French Pinnacle sheath was inserted. Through this, and also over a 0.035 inch guidewire, a 5 French JB1 catheter was advanced to the aortic arch region and selectively positioned in the right common carotid artery, the right subclavian artery, the left common carotid artery and the left vertebral artery.  There were no acute complications. The patient tolerated the procedure well.  COMPLICATIONS: None immediate.  FINDINGS: The right subclavian arteriogram demonstrates the origin of the right vertebral artery to be patent. 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  The right common carotid arteriogram demonstrates the right external carotid artery and its major branches to be normal.  The right internal carotid artery at the bulb has a smooth shallow plaque without associated  stenosis.  No ulcerations are seen.  The vessel is, otherwise, seen to opacify normally to the cranial skull base. The petrous and the cavernous segments are widely patent. There is mild smooth narrowing of the supraclinoid segment. The right middle cerebral artery proximally extending to the right middle cerebral artery M2 M3 branches demonstrate significantly attenuated caliber throughout the distribution with delayed antegrade flow more distally compared to the right anterior cerebral artery distribution.  The right anterior cerebral A1 segment also demonstrates moderate to severe smooth narrowing. There is mild mass-effect on the right A2 segment probably related to the underlying hematoma. A 3D rotational arteriogram was then performed centered over the right middle cerebral artery distribution. This failed to elucidate angiographic evidence of an intracranial aneurysm at this time.  The left common carotid arteriogram demonstrates the left external carotid artery and its major branches to be normal.  The left internal carotid artery at the bulb demonstrates a smooth atherosclerotic plaque along the lateral posterior wall with an associated narrowing of approximately 10 to 20% by the NASCET criteria.  No acute ulcerations are seen. The vessel is, otherwise, seen to opacify normally to the cranial skull base. The petrous, the cavernous and the supraclinoid segments are widely patent.  The left middle cerebral artery and the left anterior cerebral artery opacify normally into the capillary and venous phases.  There  is cross-filling via the anterior communicating artery of the right anterior cerebral artery A2 segment.  Again demonstrated is a mild right to left midline shift of the A2 segments of both anterior cerebral arteries as described earlier.  Arising in the left MCA bifurcation region is a bilobed aneurysm projecting anteriorly. This measures approximately 7 mm x 6 mm, with a small superiorly projecting  daughter aneurysm. The left vertebral artery origin is normal.  The vessel opacifies normally to the cranial skull base. The left vertebrobasilar junction and the left posterior inferior cerebellar artery opacify normally. The basilar artery, the posterior cerebral arteries, the superior cerebellar arteries and the anterior inferior cerebellar arteries opacify normally into the capillary and venous phases.  Small bulbous configuration at the basilar artery apex probably represents confluence of the vessels arising from this region.  Anastomosis of the left anterior inferior cerebellar artery with the left posterior-inferior cerebellar artery complex is seen. There is a stump of a right vertebrobasilar junction with no antegrade clearance consistent with a right vertebral artery occlusion as described earlier.  ENDOVASCULAR TREATMENT OF SEVERE VASOSPASM OF THE RIGHT MIDDLE CEREBRAL ARTERY AND THE RIGHT ANTERIOR CEREBRAL ARTERY:  The angiographic findings were discussed with the referring neurosurgeon.  It was decided to improve perfusion into the right cerebral hemisphere to avoid a potential catastrophic ischemic infarct. However, consideration of the fact that a possible aneurysm in the right middle cerebral artery distribution had not been entirely excluded just because it had not been angiographically demonstrated this time.  After discussion with the family it was elected to proceed with treatment of the right middle and the right anterior cerebral artery distribution vasospasm using intra-arterial verapamil.  Using biplane roadmap technique and constant fluoroscopic guidance, over a 0.035 inch Roadrunner guidewire, the 5 [place] JB1 catheter was advanced to the distal cervical segment of the right internal carotid artery. The guidewire was removed. Good aspiration was obtained from the hub of the 5 [place] diagnostic catheter. A control arteriogram performed showed no change in the intracranial circulation.  At  this time approximately 11 mL of verapamil was then infused slowly via a pump over approximately 30 minutes. Throughout the procedure, the patient's blood pressure and heart rate remained stable.  At the end of this, a control arteriogram performed through the 5 [place] guide catheter in the right internal carotid artery demonstrated a modestly improved caliber and flow through the right middle and the right anterior cerebral artery distribution. No abnormal filling defects were seen.  As far as noted was the release of the conical configuration at the origin of the inferior division and also the superior division of the right middle cerebral artery. An intracranial angioplasty was not performed in view of the unprotected nature of the etiology of the intracerebral and the subarachnoid hemorrhage.  The diagnostic catheter was then retrieved into the abdominal aorta and then removed. The 5 French Pinnacle sheath was then successfully retrieved with the application of an external closure device.  IMPRESSION: Severe vasospasm of the right middle cerebral artery distribution and moderate severe of the right anterior cerebral artery A1 segment with treatment and infusion intra-arterially of 11 mg of verapamil.  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   Electronically Signed   By: Luanne Bras M.D.   On: 11/20/2013 10:01   Dg Chest Port 1 View  11/20/2013  CLINICAL DATA:  Respiratory failure, followup  EXAM: PORTABLE CHEST - 1 VIEW  COMPARISON:  Portable chest x-ray 11/13/2013  FINDINGS: The tip of the endotracheal tube is approximately 3.0 cm above the carina. Opacity has increased somewhat of the left lung base most consistent with atelectasis with slight elevation of the left hemidiaphragm. Heart size is stable. NG tube remains.  IMPRESSION: Slight increase in left basilar opacity most  consistent with atelectasis.   Electronically Signed   By: Ivar Drape M.D.   On: 11/20/2013 07:59   Dg Chest Port 1 View  11/27/2013   CLINICAL DATA:  Acute respiratory failure. On ventilator.  EXAM: PORTABLE CHEST - 1 VIEW  COMPARISON:  11/29/2013  FINDINGS: Endotracheal tube and nasogastric tube remain in appropriate position. Mild opacity in lateral left lung base shows no significant change. Lungs are otherwise clear. Heart size is within normal limits. No evidence of pneumothorax.  IMPRESSION: No significant change in mild opacity in the lateral left lung base.   Electronically Signed   By: Earle Gell M.D.   On: 11/19/2013 09:39   Ir Angio Intra Extracran Sel Com Carotid Innominate Uni L Mod Sed  11/20/2013   CLINICAL DATA:  Right-sided subarachnoid hemorrhage and intra cerebral hemorrhage.  EXAM: IR ANGIO INTRA EXTRACRAN SEL INTERNAL CAROTID UNI RIGHT MOD SED BILATERAL COMMON CAROTID ARTERY AND BILATERAL VERTEBRAL ARTERY ANGIOGRAMS FOLLOWED BY ENDOVASCULAR TREATMENT OF SEVERE VASOSPASM OF THE RIGHT MIDDLE CEREBRAL ARTERY BRANCHES USING INTRA-ARTERIAL VERAPAMIL.  ANESTHESIA/SEDATION: General.  MEDICATIONS: As per general anesthesia.  CONTRAST:  167mL OMNIPAQUE IOHEXOL 300 MG/ML  SOLN  PROCEDURE: Following a full explanation of the procedure along with the potential associated complications, an informed witnessed consent was obtained from the patient's sister.  The right groin was prepped and draped in the usual sterile fashion. Thereafter using modified Seldinger technique, transfemoral access into the right common femoral artery was obtained without difficulty. Over a 0.035 inch guidewire, a 5 French Pinnacle sheath was inserted. Through this, and also over a 0.035 inch guidewire, a 5 French JB1 catheter was advanced to the aortic arch region and selectively positioned in the right common carotid artery, the right subclavian artery, the left common carotid artery and the left vertebral artery.  There  were no acute complications. The patient tolerated the procedure well.  COMPLICATIONS: None immediate.  FINDINGS: The right subclavian arteriogram demonstrates the origin of the right vertebral artery to be patent. 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  The right common carotid arteriogram demonstrates the right external carotid artery and its major branches to be normal.  The right internal carotid artery at the bulb has a smooth shallow plaque without associated stenosis.  No ulcerations are seen.  The vessel is, otherwise, seen to opacify normally to the cranial skull base. The petrous and the cavernous segments are widely patent. There is mild smooth narrowing of the supraclinoid segment. The right middle cerebral artery proximally extending to the right middle cerebral artery M2 M3 branches demonstrate significantly attenuated caliber throughout the distribution with delayed antegrade flow more distally compared to the right anterior cerebral artery distribution.  The right anterior cerebral A1 segment also demonstrates moderate to severe smooth narrowing. There is mild mass-effect on the right A2 segment probably related to the underlying hematoma. A 3D rotational arteriogram was then performed centered over the right middle cerebral artery distribution. This failed to elucidate angiographic evidence  of an intracranial aneurysm at this time.  The left common carotid arteriogram demonstrates the left external carotid artery and its major branches to be normal.  The left internal carotid artery at the bulb demonstrates a smooth atherosclerotic plaque along the lateral posterior wall with an associated narrowing of approximately 10 to 20% by the NASCET criteria.  No acute ulcerations are seen. The vessel is, otherwise, seen to opacify normally to the cranial skull base. The petrous, the cavernous and  the supraclinoid segments are widely patent.  The left middle cerebral artery and the left anterior cerebral artery opacify normally into the capillary and venous phases.  There is cross-filling via the anterior communicating artery of the right anterior cerebral artery A2 segment.  Again demonstrated is a mild right to left midline shift of the A2 segments of both anterior cerebral arteries as described earlier.  Arising in the left MCA bifurcation region is a bilobed aneurysm projecting anteriorly. This measures approximately 7 mm x 6 mm, with a small superiorly projecting daughter aneurysm. The left vertebral artery origin is normal.  The vessel opacifies normally to the cranial skull base. The left vertebrobasilar junction and the left posterior inferior cerebellar artery opacify normally. The basilar artery, the posterior cerebral arteries, the superior cerebellar arteries and the anterior inferior cerebellar arteries opacify normally into the capillary and venous phases.  Small bulbous configuration at the basilar artery apex probably represents confluence of the vessels arising from this region.  Anastomosis of the left anterior inferior cerebellar artery with the left posterior-inferior cerebellar artery complex is seen. There is a stump of a right vertebrobasilar junction with no antegrade clearance consistent with a right vertebral artery occlusion as described earlier.  ENDOVASCULAR TREATMENT OF SEVERE VASOSPASM OF THE RIGHT MIDDLE CEREBRAL ARTERY AND THE RIGHT ANTERIOR CEREBRAL ARTERY:  The angiographic findings were discussed with the referring neurosurgeon.  It was decided to improve perfusion into the right cerebral hemisphere to avoid a potential catastrophic ischemic infarct. However, consideration of the fact that a possible aneurysm in the right middle cerebral artery distribution had not been entirely excluded just because it had not been angiographically demonstrated this time.  After discussion  with the family it was elected to proceed with treatment of the right middle and the right anterior cerebral artery distribution vasospasm using intra-arterial verapamil.  Using biplane roadmap technique and constant fluoroscopic guidance, over a 0.035 inch Roadrunner guidewire, the 5 [place] JB1 catheter was advanced to the distal cervical segment of the right internal carotid artery. The guidewire was removed. Good aspiration was obtained from the hub of the 5 [place] diagnostic catheter. A control arteriogram performed showed no change in the intracranial circulation.  At this time approximately 11 mL of verapamil was then infused slowly via a pump over approximately 30 minutes. Throughout the procedure, the patient's blood pressure and heart rate remained stable.  At the end of this, a control arteriogram performed through the 5 [place] guide catheter in the right internal carotid artery demonstrated a modestly improved caliber and flow through the right middle and the right anterior cerebral artery distribution. No abnormal filling defects were seen.  As far as noted was the release of the conical configuration at the origin of the inferior division and also the superior division of the right middle cerebral artery. An intracranial angioplasty was not performed in view of the unprotected nature of the etiology of the intracerebral and the subarachnoid hemorrhage.  The diagnostic catheter was then retrieved into the  abdominal aorta and then removed. The 5 French Pinnacle sheath was then successfully retrieved with the application of an external closure device.  IMPRESSION: Severe vasospasm of the right middle cerebral artery distribution and moderate severe of the right anterior cerebral artery A1 segment with treatment and infusion intra-arterially of 11 mg of verapamil.  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   Electronically Signed   By: Luanne Bras M.D.   On: 11/20/2013 10:01   Ir Angio Intra Extracran Sel Internal Carotid Uni R Mod Sed  11/20/2013   CLINICAL DATA:  Right-sided subarachnoid hemorrhage and intra cerebral hemorrhage.  EXAM: IR ANGIO INTRA EXTRACRAN SEL INTERNAL CAROTID UNI RIGHT MOD SED BILATERAL COMMON CAROTID ARTERY AND BILATERAL VERTEBRAL ARTERY ANGIOGRAMS FOLLOWED BY ENDOVASCULAR TREATMENT OF SEVERE VASOSPASM OF THE RIGHT MIDDLE CEREBRAL ARTERY BRANCHES USING INTRA-ARTERIAL VERAPAMIL.  ANESTHESIA/SEDATION: General.  MEDICATIONS: As per general anesthesia.  CONTRAST:  139mL OMNIPAQUE IOHEXOL 300 MG/ML  SOLN  PROCEDURE: Following a full explanation of the procedure along with the potential associated complications, an informed witnessed consent was obtained from the patient's sister.  The right groin was prepped and draped in the usual sterile fashion. Thereafter using modified Seldinger technique, transfemoral access into the right common femoral artery was obtained without difficulty. Over a 0.035 inch guidewire, a 5 French Pinnacle sheath was inserted. Through this, and also over a 0.035 inch guidewire, a 5 French JB1 catheter was advanced to the aortic arch region and selectively positioned in the right common carotid artery, the right subclavian artery, the left common carotid artery and the left vertebral artery.  There were no acute complications. The patient tolerated the procedure well.  COMPLICATIONS: None immediate.  FINDINGS: The right subclavian arteriogram demonstrates the origin of the right vertebral artery to be patent. 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  The right common carotid arteriogram demonstrates the right external carotid artery and its major branches to be normal.  The right internal carotid artery at the bulb has a smooth  shallow plaque without associated stenosis.  No ulcerations are seen.  The vessel is, otherwise, seen to opacify normally to the cranial skull base. The petrous and the cavernous segments are widely patent. There is mild smooth narrowing of the supraclinoid segment. The right middle cerebral artery proximally extending to the right middle cerebral artery M2 M3 branches demonstrate significantly attenuated caliber throughout the distribution with delayed antegrade flow more distally compared to the right anterior cerebral artery distribution.  The right anterior cerebral A1 segment also demonstrates moderate to severe smooth narrowing. There is mild mass-effect on the right A2 segment probably related to the underlying hematoma. A 3D rotational arteriogram was then performed centered over the right middle cerebral artery distribution. This failed to elucidate angiographic evidence of an intracranial aneurysm at this time.  The left common carotid arteriogram demonstrates the left external carotid artery and its major branches to be normal.  The left internal carotid artery at the bulb demonstrates a smooth atherosclerotic plaque along the lateral posterior wall with an associated narrowing of approximately 10 to 20% by the NASCET criteria.  No acute ulcerations are seen. The vessel is, otherwise, seen to opacify normally to the cranial skull base. The petrous, the cavernous and the supraclinoid segments are widely patent.  The left middle cerebral artery and the left anterior cerebral artery opacify normally into the capillary and venous phases.  There is cross-filling via the anterior communicating artery of the right anterior cerebral artery A2 segment.  Again demonstrated is a mild right to left midline shift of the A2 segments of both anterior cerebral arteries as described earlier.  Arising in the left MCA bifurcation region is a bilobed aneurysm projecting anteriorly. This measures approximately 7 mm x 6 mm,  with a small superiorly projecting daughter aneurysm. The left vertebral artery origin is normal.  The vessel opacifies normally to the cranial skull base. The left vertebrobasilar junction and the left posterior inferior cerebellar artery opacify normally. The basilar artery, the posterior cerebral arteries, the superior cerebellar arteries and the anterior inferior cerebellar arteries opacify normally into the capillary and venous phases.  Small bulbous configuration at the basilar artery apex probably represents confluence of the vessels arising from this region.  Anastomosis of the left anterior inferior cerebellar artery with the left posterior-inferior cerebellar artery complex is seen. There is a stump of a right vertebrobasilar junction with no antegrade clearance consistent with a right vertebral artery occlusion as described earlier.  ENDOVASCULAR TREATMENT OF SEVERE VASOSPASM OF THE RIGHT MIDDLE CEREBRAL ARTERY AND THE RIGHT ANTERIOR CEREBRAL ARTERY:  The angiographic findings were discussed with the referring neurosurgeon.  It was decided to improve perfusion into the right cerebral hemisphere to avoid a potential catastrophic ischemic infarct. However, consideration of the fact that a possible aneurysm in the right middle cerebral artery distribution had not been entirely excluded just because it had not been angiographically demonstrated this time.  After discussion with the family it was elected to proceed with treatment of the right middle and the right anterior cerebral artery distribution vasospasm using intra-arterial verapamil.  Using biplane roadmap technique and constant fluoroscopic guidance, over a 0.035 inch Roadrunner guidewire, the 5 [place] JB1 catheter was advanced to the distal cervical segment of the right internal carotid artery. The guidewire was removed. Good aspiration was obtained from the hub of the 5 [place] diagnostic catheter. A control arteriogram performed showed no change in  the intracranial circulation.  At this time approximately 11 mL of verapamil was then infused slowly via a pump over approximately 30 minutes. Throughout the procedure, the patient's blood pressure and heart rate remained stable.  At the end of this, a control arteriogram performed through the 5 [place] guide catheter in the right internal carotid artery demonstrated a modestly improved caliber and flow through the right middle and the right anterior cerebral artery distribution. No abnormal filling defects were seen.  As far as noted was the release of the conical configuration at the origin of the inferior division and also the superior division of the right middle cerebral artery. An intracranial angioplasty was not performed in view of the unprotected nature of the etiology of the intracerebral and the subarachnoid hemorrhage.  The diagnostic catheter was then retrieved into the abdominal aorta and then removed. The 5 French Pinnacle sheath was then successfully retrieved with the application of an external closure device.  IMPRESSION: Severe vasospasm of the right middle cerebral artery distribution and moderate severe of the right anterior cerebral artery A1 segment with treatment and infusion intra-arterially of 11 mg of verapamil.  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   Electronically Signed  By: Luanne Bras M.D.   On: 11/20/2013 10:01   Ir Angio Vertebral Sel Subclavian Innominate Uni R Mod Sed  11/20/2013   CLINICAL DATA:  Right-sided subarachnoid hemorrhage and intra cerebral hemorrhage.  EXAM: IR ANGIO INTRA EXTRACRAN SEL INTERNAL CAROTID UNI RIGHT MOD SED BILATERAL COMMON CAROTID ARTERY AND BILATERAL VERTEBRAL ARTERY ANGIOGRAMS FOLLOWED BY ENDOVASCULAR TREATMENT OF SEVERE VASOSPASM OF THE RIGHT MIDDLE CEREBRAL ARTERY BRANCHES USING INTRA-ARTERIAL VERAPAMIL.   ANESTHESIA/SEDATION: General.  MEDICATIONS: As per general anesthesia.  CONTRAST:  117mL OMNIPAQUE IOHEXOL 300 MG/ML  SOLN  PROCEDURE: Following a full explanation of the procedure along with the potential associated complications, an informed witnessed consent was obtained from the patient's sister.  The right groin was prepped and draped in the usual sterile fashion. Thereafter using modified Seldinger technique, transfemoral access into the right common femoral artery was obtained without difficulty. Over a 0.035 inch guidewire, a 5 French Pinnacle sheath was inserted. Through this, and also over a 0.035 inch guidewire, a 5 French JB1 catheter was advanced to the aortic arch region and selectively positioned in the right common carotid artery, the right subclavian artery, the left common carotid artery and the left vertebral artery.  There were no acute complications. The patient tolerated the procedure well.  COMPLICATIONS: None immediate.  FINDINGS: The right subclavian arteriogram demonstrates the origin of the right vertebral artery to be patent. 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  The right common carotid arteriogram demonstrates the right external carotid artery and its major branches to be normal.  The right internal carotid artery at the bulb has a smooth shallow plaque without associated stenosis.  No ulcerations are seen.  The vessel is, otherwise, seen to opacify normally to the cranial skull base. The petrous and the cavernous segments are widely patent. There is mild smooth narrowing of the supraclinoid segment. The right middle cerebral artery proximally extending to the right middle cerebral artery M2 M3 branches demonstrate significantly attenuated caliber throughout the distribution with delayed antegrade flow more distally compared to the right anterior cerebral artery  distribution.  The right anterior cerebral A1 segment also demonstrates moderate to severe smooth narrowing. There is mild mass-effect on the right A2 segment probably related to the underlying hematoma. A 3D rotational arteriogram was then performed centered over the right middle cerebral artery distribution. This failed to elucidate angiographic evidence of an intracranial aneurysm at this time.  The left common carotid arteriogram demonstrates the left external carotid artery and its major branches to be normal.  The left internal carotid artery at the bulb demonstrates a smooth atherosclerotic plaque along the lateral posterior wall with an associated narrowing of approximately 10 to 20% by the NASCET criteria.  No acute ulcerations are seen. The vessel is, otherwise, seen to opacify normally to the cranial skull base. The petrous, the cavernous and the supraclinoid segments are widely patent.  The left middle cerebral artery and the left anterior cerebral artery opacify normally into the capillary and venous phases.  There is cross-filling via the anterior communicating artery of the right anterior cerebral artery A2 segment.  Again demonstrated is a mild right to left midline shift of the A2 segments of both anterior cerebral arteries as described earlier.  Arising in the left MCA bifurcation region is a bilobed aneurysm projecting anteriorly. This measures approximately 7 mm x 6 mm, with a small superiorly projecting daughter aneurysm. The  left vertebral artery origin is normal.  The vessel opacifies normally to the cranial skull base. The left vertebrobasilar junction and the left posterior inferior cerebellar artery opacify normally. The basilar artery, the posterior cerebral arteries, the superior cerebellar arteries and the anterior inferior cerebellar arteries opacify normally into the capillary and venous phases.  Small bulbous configuration at the basilar artery apex probably represents confluence of  the vessels arising from this region.  Anastomosis of the left anterior inferior cerebellar artery with the left posterior-inferior cerebellar artery complex is seen. There is a stump of a right vertebrobasilar junction with no antegrade clearance consistent with a right vertebral artery occlusion as described earlier.  ENDOVASCULAR TREATMENT OF SEVERE VASOSPASM OF THE RIGHT MIDDLE CEREBRAL ARTERY AND THE RIGHT ANTERIOR CEREBRAL ARTERY:  The angiographic findings were discussed with the referring neurosurgeon.  It was decided to improve perfusion into the right cerebral hemisphere to avoid a potential catastrophic ischemic infarct. However, consideration of the fact that a possible aneurysm in the right middle cerebral artery distribution had not been entirely excluded just because it had not been angiographically demonstrated this time.  After discussion with the family it was elected to proceed with treatment of the right middle and the right anterior cerebral artery distribution vasospasm using intra-arterial verapamil.  Using biplane roadmap technique and constant fluoroscopic guidance, over a 0.035 inch Roadrunner guidewire, the 5 [place] JB1 catheter was advanced to the distal cervical segment of the right internal carotid artery. The guidewire was removed. Good aspiration was obtained from the hub of the 5 [place] diagnostic catheter. A control arteriogram performed showed no change in the intracranial circulation.  At this time approximately 11 mL of verapamil was then infused slowly via a pump over approximately 30 minutes. Throughout the procedure, the patient's blood pressure and heart rate remained stable.  At the end of this, a control arteriogram performed through the 5 [place] guide catheter in the right internal carotid artery demonstrated a modestly improved caliber and flow through the right middle and the right anterior cerebral artery distribution. No abnormal filling defects were seen.  As far as  noted was the release of the conical configuration at the origin of the inferior division and also the superior division of the right middle cerebral artery. An intracranial angioplasty was not performed in view of the unprotected nature of the etiology of the intracerebral and the subarachnoid hemorrhage.  The diagnostic catheter was then retrieved into the abdominal aorta and then removed. The 5 French Pinnacle sheath was then successfully retrieved with the application of an external closure device.  IMPRESSION: Severe vasospasm of the right middle cerebral artery distribution and moderate severe of the right anterior cerebral artery A1 segment with treatment and infusion intra-arterially of 11 mg of verapamil.  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   Electronically Signed   By: Luanne Bras M.D.   On: 11/20/2013 10:01   Ir Angio Vertebral Sel Vertebral Uni L Mod Sed  11/20/2013   CLINICAL DATA:  Right-sided subarachnoid hemorrhage and intra cerebral hemorrhage.  EXAM: IR ANGIO INTRA EXTRACRAN SEL INTERNAL CAROTID UNI RIGHT MOD SED BILATERAL COMMON CAROTID ARTERY AND BILATERAL VERTEBRAL ARTERY ANGIOGRAMS FOLLOWED BY ENDOVASCULAR TREATMENT OF SEVERE VASOSPASM OF THE RIGHT MIDDLE CEREBRAL ARTERY BRANCHES USING INTRA-ARTERIAL VERAPAMIL.  ANESTHESIA/SEDATION: General.  MEDICATIONS: As per general anesthesia.  CONTRAST:  147mL OMNIPAQUE IOHEXOL  300 MG/ML  SOLN  PROCEDURE: Following a full explanation of the procedure along with the potential associated complications, an informed witnessed consent was obtained from the patient's sister.  The right groin was prepped and draped in the usual sterile fashion. Thereafter using modified Seldinger technique, transfemoral access into the right common femoral artery was obtained without difficulty. Over a 0.035 inch guidewire, a 5 French  Pinnacle sheath was inserted. Through this, and also over a 0.035 inch guidewire, a 5 [place] JB1 catheter was advanced to the aortic arch region and selectively positioned in the right common carotid artery, the right subclavian artery, the left common carotid artery and the left vertebral artery.  There were no acute complications. The patient tolerated the procedure well.  COMPLICATIONS: None immediate.  FINDINGS: The right subclavian arteriogram demonstrates the origin of the right vertebral artery to be patent. 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  The right common carotid arteriogram demonstrates the right external carotid artery and its major branches to be normal.  The right internal carotid artery at the bulb has a smooth shallow plaque without associated stenosis.  No ulcerations are seen.  The vessel is, otherwise, seen to opacify normally to the cranial skull base. The petrous and the cavernous segments are widely patent. There is mild smooth narrowing of the supraclinoid segment. The right middle cerebral artery proximally extending to the right middle cerebral artery M2 M3 branches demonstrate significantly attenuated caliber throughout the distribution with delayed antegrade flow more distally compared to the right anterior cerebral artery distribution.  The right anterior cerebral A1 segment also demonstrates moderate to severe smooth narrowing. There is mild mass-effect on the right A2 segment probably related to the underlying hematoma. A 3D rotational arteriogram was then performed centered over the right middle cerebral artery distribution. This failed to elucidate angiographic evidence of an intracranial aneurysm at this time.  The left common carotid arteriogram demonstrates the left external carotid artery and its major branches to be normal.  The left internal carotid artery at  the bulb demonstrates a smooth atherosclerotic plaque along the lateral posterior wall with an associated narrowing of approximately 10 to 20% by the NASCET criteria.  No acute ulcerations are seen. The vessel is, otherwise, seen to opacify normally to the cranial skull base. The petrous, the cavernous and the supraclinoid segments are widely patent.  The left middle cerebral artery and the left anterior cerebral artery opacify normally into the capillary and venous phases.  There is cross-filling via the anterior communicating artery of the right anterior cerebral artery A2 segment.  Again demonstrated is a mild right to left midline shift of the A2 segments of both anterior cerebral arteries as described earlier.  Arising in the left MCA bifurcation region is a bilobed aneurysm projecting anteriorly. This measures approximately 7 mm x 6 mm, with a small superiorly projecting daughter aneurysm. The left vertebral artery origin is normal.  The vessel opacifies normally to the cranial skull base. The left vertebrobasilar junction and the left posterior inferior cerebellar artery opacify normally. The basilar artery, the posterior cerebral arteries, the superior cerebellar arteries and the anterior inferior cerebellar arteries opacify normally into the capillary and venous phases.  Small bulbous configuration at the basilar artery apex probably represents confluence of the vessels arising from this region.  Anastomosis of the left anterior inferior cerebellar artery with the left posterior-inferior cerebellar artery complex is  seen. There is a stump of a right vertebrobasilar junction with no antegrade clearance consistent with a right vertebral artery occlusion as described earlier.  ENDOVASCULAR TREATMENT OF SEVERE VASOSPASM OF THE RIGHT MIDDLE CEREBRAL ARTERY AND THE RIGHT ANTERIOR CEREBRAL ARTERY:  The angiographic findings were discussed with the referring neurosurgeon.  It was decided to improve perfusion into  the right cerebral hemisphere to avoid a potential catastrophic ischemic infarct. However, consideration of the fact that a possible aneurysm in the right middle cerebral artery distribution had not been entirely excluded just because it had not been angiographically demonstrated this time.  After discussion with the family it was elected to proceed with treatment of the right middle and the right anterior cerebral artery distribution vasospasm using intra-arterial verapamil.  Using biplane roadmap technique and constant fluoroscopic guidance, over a 0.035 inch Roadrunner guidewire, the 5 [place] JB1 catheter was advanced to the distal cervical segment of the right internal carotid artery. The guidewire was removed. Good aspiration was obtained from the hub of the 5 [place] diagnostic catheter. A control arteriogram performed showed no change in the intracranial circulation.  At this time approximately 11 mL of verapamil was then infused slowly via a pump over approximately 30 minutes. Throughout the procedure, the patient's blood pressure and heart rate remained stable.  At the end of this, a control arteriogram performed through the 5 [place] guide catheter in the right internal carotid artery demonstrated a modestly improved caliber and flow through the right middle and the right anterior cerebral artery distribution. No abnormal filling defects were seen.  As far as noted was the release of the conical configuration at the origin of the inferior division and also the superior division of the right middle cerebral artery. An intracranial angioplasty was not performed in view of the unprotected nature of the etiology of the intracerebral and the subarachnoid hemorrhage.  The diagnostic catheter was then retrieved into the abdominal aorta and then removed. The 5 [place] Pinnacle sheath was then successfully retrieved with the application of an external closure device.  IMPRESSION: Severe vasospasm of the right middle  cerebral artery distribution and moderate severe of the right anterior cerebral artery A1 segment with treatment and infusion intra-arterially of 11 mg of verapamil.  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   Electronically Signed   By: Luanne Bras M.D.   On: 11/20/2013 10:01    Anti-infectives: Anti-infectives   Start     Dose/Rate Route Frequency Ordered Stop   11/21/2013 1145  vancomycin (VANCOCIN) IVPB 1000 mg/200 mL premix     1,000 mg 200 mL/hr over 60 Minutes Intravenous  Once 12/01/2013 1139 11/27/2013 1343      Assessment/Plan: s/p Procedure(s): RADIOLOGY WITH ANESTHESIA (N/A)  R MCA vasospasm; IV verapamil 9/7 L MCA aneurysm On vent Responding somewhat Will report to Dr Estanislado Pandy Will follow   LOS: 2 days    Marshe Shrestha A 11/20/2013

## 2013-11-21 ENCOUNTER — Inpatient Hospital Stay (HOSPITAL_COMMUNITY): Payer: Medicare Other

## 2013-11-21 ENCOUNTER — Encounter (HOSPITAL_COMMUNITY): Payer: Self-pay | Admitting: Radiology

## 2013-11-21 LAB — MAGNESIUM: Magnesium: 1 mg/dL — ABNORMAL LOW (ref 1.5–2.5)

## 2013-11-21 LAB — BASIC METABOLIC PANEL
ANION GAP: 15 (ref 5–15)
Anion gap: 14 (ref 5–15)
BUN: 9 mg/dL (ref 6–23)
BUN: 8 mg/dL (ref 6–23)
CALCIUM: 7 mg/dL — AB (ref 8.4–10.5)
CALCIUM: 7.4 mg/dL — AB (ref 8.4–10.5)
CO2: 22 mEq/L (ref 19–32)
CO2: 22 meq/L (ref 19–32)
Chloride: 95 mEq/L — ABNORMAL LOW (ref 96–112)
Chloride: 94 mEq/L — ABNORMAL LOW (ref 96–112)
Creatinine, Ser: 0.72 mg/dL (ref 0.50–1.10)
Creatinine, Ser: 0.69 mg/dL (ref 0.50–1.10)
GFR calc Af Amer: >90 mL/min (ref >90)
GFR calc non Af Amer: 89 mL/min — ABNORMAL LOW (ref >90)
GFR, EST NON AFRICAN AMERICAN: 88 mL/min — AB (ref >90)
GLUCOSE: 88 mg/dL (ref 70–99)
Glucose, Bld: 124 mg/dL — ABNORMAL HIGH (ref 70–99)
POTASSIUM: 4 meq/L (ref 3.7–5.3)
Potassium: 2.7 mEq/L — CL (ref 3.7–5.3)
SODIUM: 131 meq/L — AB (ref 137–147)
Sodium: 131 mEq/L — ABNORMAL LOW (ref 137–147)

## 2013-11-21 LAB — CBC
HCT: 28.6 % — ABNORMAL LOW (ref 36.0–46.0)
Hemoglobin: 9.9 g/dL — ABNORMAL LOW (ref 12.0–15.0)
MCH: 34.4 pg — ABNORMAL HIGH (ref 26.0–34.0)
MCHC: 34.6 g/dL (ref 30.0–36.0)
MCV: 99.3 fL (ref 78.0–100.0)
PLATELETS: 215 10*3/uL (ref 150–400)
RBC: 2.88 MIL/uL — AB (ref 3.87–5.11)
RDW: 13.2 % (ref 11.5–15.5)
WBC: 7.8 10*3/uL (ref 4.0–10.5)

## 2013-11-21 MED ORDER — LORAZEPAM 2 MG/ML IJ SOLN
1.0000 mg | INTRAMUSCULAR | Status: DC | PRN
  Administered 2013-11-21: 2 mg via INTRAVENOUS
  Administered 2013-11-22: 1 mg via INTRAVENOUS
  Filled 2013-11-21 (×2): qty 1

## 2013-11-21 MED ORDER — SODIUM CHLORIDE 0.9 % IV SOLN: INTRAVENOUS | Status: DC

## 2013-11-21 MED ORDER — POTASSIUM CHLORIDE 10 MEQ/100ML IV SOLN
10.0000 meq | INTRAVENOUS | Status: AC
  Administered 2013-11-21 (×6): 10 meq via INTRAVENOUS
  Filled 2013-11-21 (×6): qty 100

## 2013-11-21 MED ORDER — FENTANYL CITRATE 0.05 MG/ML IJ SOLN
25.0000 ug | INTRAMUSCULAR | Status: DC | PRN
  Administered 2013-11-22: 25 ug via INTRAVENOUS
  Administered 2013-11-23: 50 ug via INTRAVENOUS
  Administered 2013-11-23: 25 ug via INTRAVENOUS
  Administered 2013-11-24 – 2013-12-07 (×22): 50 ug via INTRAVENOUS
  Filled 2013-11-21 (×28): qty 2

## 2013-11-21 NOTE — Progress Notes (Signed)
Pt transported to CT ?

## 2013-11-21 NOTE — Progress Notes (Addendum)
VASCULAR LAB PRELIMINARY  PRELIMINARY  PRELIMINARY  PRELIMINARY  Transcranial Doppler  Date POD PCO2 HCT BP  MCA ACA PCA OPHT SIPH VERT Basilar  11/18/2013 SB     Right  Left   40  26   --  -30   --  --   21  18   30  31    --  --     --    11-21-13 hc     Right  Left   -  -   -  -   -  -   30  23   -39  56   -44  -47   -45      11-23-13 fgn     Right  Left   -  -   -  -   -  -   22  24   35  24   31  27                Right  Left                                             Right  Left                                            Right  Left                                            Right  Left                                        MCA = Middle Cerebral Artery      OPHT = Opthalmic Artery     BASILAR = Basilar Artery   ACA = Anterior Cerebral Artery     SIPH = Carotid Siphon PCA = Posterior Cerebral Artery   VERT = Verterbral Artery                   Normal MCA = 62+\-12 ACA = 50+\-12 PCA = 42+\-23     CESTONE, HELENE, RVT 11/21/2013, 1:18 PM

## 2013-11-21 NOTE — Progress Notes (Signed)
Subjective: Patient reports still intubated  Objective: Vital signs in last 24 hours: Temp:  [98.3 F (36.8 C)-99.5 F (37.5 C)] 98.9 F (37.2 C) (09/09 0310) Pulse Rate:  [50-74] 56 (09/09 0500) Resp:  [13-28] 15 (09/09 0500) BP: (98-178)/(47-90) 128/70 mmHg (09/09 0500) SpO2:  [98 %-100 %] 100 % (09/09 0500) Arterial Line BP: (81-169)/(42-70) 140/55 mmHg (09/09 0500) FiO2 (%):  [30 %] 30 % (09/09 0327)  Intake/Output from previous day: 09/08 0701 - 09/09 0700 In: 1152.5 [I.V.:752.5; NG/GT:190; IV Piggyback:210] Out: 9798 [Urine:1675] Intake/Output this shift:    Physical Exam: Intubated, opens eyes.  PERRL, EOMI.  Following commands all extremities, clearly now with left hemiparesis, worse in arm than leg.  Lab Results:  Recent Labs  11/20/13 0550 11/21/13 0500  WBC 8.0 7.8  HGB 9.6* 9.9*  HCT 28.0* 28.6*  PLT 248 215   BMET  Recent Labs  11/20/13 0550 11/21/13 0500  NA 131* 131*  K 3.2* 2.7*  CL 96 95*  CO2 22 22  GLUCOSE 109* 124*  BUN 10 9  CREATININE 0.82 0.72  CALCIUM 7.2* 7.0*    Studies/Results: Ct Head Wo Contrast  11/21/2013   CLINICAL DATA:  Followup intracranial hemorrhage  EXAM: CT HEAD WITHOUT CONTRAST  TECHNIQUE: Contiguous axial images were obtained from the base of the skull through the vertex without intravenous contrast.  COMPARISON:  Prior CT from 11/20/2013  FINDINGS: Right frontal parenchymal hematoma is grossly similar in size as compared to prior study. Again, hemorrhages is seen within the subarachnoid space over the right frontal, parietal, and temporal lobes. Subdural hemorrhage tracks along the anterior falx. Subarachnoid hemorrhage within the right pre pontine cistern extending towards the right cerebellopontine angle also noted. Hemorrhage present within the left quadrigeminal plate cistern as well. Overall, amount of hemorrhage is not significantly changed. No definite new hemorrhage.  Vasogenic edema surrounding the right frontal  parenchymal hematoma is overall similar in volume but slightly more well defined, compatible with normal expected interval changes. Associated right-to-left midline shift is similar measuring 4-5 mm.  No definite new acute large vessel territory infarct.  Calvarium and scalp soft tissues remain within normal limits. No acute abnormality seen about the orbits. Paranasal sinuses and mastoid air cells are clear.  IMPRESSION: 1. No significant interval change in intraparenchymal, subarachnoid, and subdural hemorrhage as compared to 11/20/13. Associated vasogenic edema is similar with stable 4-5 mm of right-to-left midline shift. No hydrocephalus. 2. No new hemorrhage.   Electronically Signed   By: Jeannine Boga M.D.   On: 11/21/2013 04:54   Ct Head Wo Contrast  11/20/2013   CLINICAL DATA:  Followup intracranial hemorrhage.  EXAM: CT HEAD WITHOUT CONTRAST  TECHNIQUE: Contiguous axial images were obtained from the base of the skull through the vertex without intravenous contrast.  COMPARISON:  12/11/2013.  FINDINGS: Right frontal parenchymal hematoma is unchanged in size. Hemorrhage extends to the subarachnoid space over the right frontal, parietal and temporal lobes. Subdural hemorrhage tracks along the interhemispheric fissure. Subarachnoid hemorrhage in the posterior fossa is evident along the right pre pontine cistern extending toward the right cerebellopontine ankle, and in the left quadrigeminal plate cistern. There is no evidence of new intracranial hemorrhage. No evidence of an ischemic infarct.  Vasogenic edema surrounding the right frontal parenchymal hematoma similar. Mass effect with partial effacement of the right lateral ventricle and 4.8 mm of left midline shift are without substantial change.  Visualized sinuses and mastoid air cells are clear.  IMPRESSION: 1. No  change in the intracranial hemorrhage since the most recent prior study. Right frontal parenchymal hematoma is stable in size as is the  degree of mass effect. Subarachnoid hemorrhage and areas of subdural hemorrhage are stable. No new intracranial hemorrhage. 2. Mass effect and midline shift ir stable. 3. No hydrocephalus.   Electronically Signed   By: Lajean Manes M.D.   On: 11/20/2013 09:03   Ir Transcath/rx/inf Non Thrombolysis  11/20/2013   CLINICAL DATA:  Right-sided subarachnoid hemorrhage and intra cerebral hemorrhage.  EXAM: IR ANGIO INTRA EXTRACRAN SEL INTERNAL CAROTID UNI RIGHT MOD SED BILATERAL COMMON CAROTID ARTERY AND BILATERAL VERTEBRAL ARTERY ANGIOGRAMS FOLLOWED BY ENDOVASCULAR TREATMENT OF SEVERE VASOSPASM OF THE RIGHT MIDDLE CEREBRAL ARTERY BRANCHES USING INTRA-ARTERIAL VERAPAMIL.  ANESTHESIA/SEDATION: General.  MEDICATIONS: As per general anesthesia.  CONTRAST:  151mL OMNIPAQUE IOHEXOL 300 MG/ML  SOLN  PROCEDURE: Following a full explanation of the procedure along with the potential associated complications, an informed witnessed consent was obtained from the patient's sister.  The right groin was prepped and draped in the usual sterile fashion. Thereafter using modified Seldinger technique, transfemoral access into the right common femoral artery was obtained without difficulty. Over a 0.035 inch guidewire, a 5 French Pinnacle sheath was inserted. Through this, and also over a 0.035 inch guidewire, a 5 French JB1 catheter was advanced to the aortic arch region and selectively positioned in the right common carotid artery, the right subclavian artery, the left common carotid artery and the left vertebral artery.  There were no acute complications. The patient tolerated the procedure well.  COMPLICATIONS: None immediate.  FINDINGS: The right subclavian arteriogram demonstrates the origin of the right vertebral artery to be patent. 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  The right common carotid arteriogram demonstrates  the right external carotid artery and its major branches to be normal.  The right internal carotid artery at the bulb has a smooth shallow plaque without associated stenosis.  No ulcerations are seen.  The vessel is, otherwise, seen to opacify normally to the cranial skull base. The petrous and the cavernous segments are widely patent. There is mild smooth narrowing of the supraclinoid segment. The right middle cerebral artery proximally extending to the right middle cerebral artery M2 M3 branches demonstrate significantly attenuated caliber throughout the distribution with delayed antegrade flow more distally compared to the right anterior cerebral artery distribution.  The right anterior cerebral A1 segment also demonstrates moderate to severe smooth narrowing. There is mild mass-effect on the right A2 segment probably related to the underlying hematoma. A 3D rotational arteriogram was then performed centered over the right middle cerebral artery distribution. This failed to elucidate angiographic evidence of an intracranial aneurysm at this time.  The left common carotid arteriogram demonstrates the left external carotid artery and its major branches to be normal.  The left internal carotid artery at the bulb demonstrates a smooth atherosclerotic plaque along the lateral posterior wall with an associated narrowing of approximately 10 to 20% by the NASCET criteria.  No acute ulcerations are seen. The vessel is, otherwise, seen to opacify normally to the cranial skull base. The petrous, the cavernous and the supraclinoid segments are widely patent.  The left middle cerebral artery and the left anterior cerebral artery opacify normally into the capillary and venous phases.  There is cross-filling via the anterior communicating artery of the right anterior cerebral artery A2 segment.  Again demonstrated  is a mild right to left midline shift of the A2 segments of both anterior cerebral arteries as described earlier.   Arising in the left MCA bifurcation region is a bilobed aneurysm projecting anteriorly. This measures approximately 7 mm x 6 mm, with a small superiorly projecting daughter aneurysm. The left vertebral artery origin is normal.  The vessel opacifies normally to the cranial skull base. The left vertebrobasilar junction and the left posterior inferior cerebellar artery opacify normally. The basilar artery, the posterior cerebral arteries, the superior cerebellar arteries and the anterior inferior cerebellar arteries opacify normally into the capillary and venous phases.  Small bulbous configuration at the basilar artery apex probably represents confluence of the vessels arising from this region.  Anastomosis of the left anterior inferior cerebellar artery with the left posterior-inferior cerebellar artery complex is seen. There is a stump of a right vertebrobasilar junction with no antegrade clearance consistent with a right vertebral artery occlusion as described earlier.  ENDOVASCULAR TREATMENT OF SEVERE VASOSPASM OF THE RIGHT MIDDLE CEREBRAL ARTERY AND THE RIGHT ANTERIOR CEREBRAL ARTERY:  The angiographic findings were discussed with the referring neurosurgeon.  It was decided to improve perfusion into the right cerebral hemisphere to avoid a potential catastrophic ischemic infarct. However, consideration of the fact that a possible aneurysm in the right middle cerebral artery distribution had not been entirely excluded just because it had not been angiographically demonstrated this time.  After discussion with the family it was elected to proceed with treatment of the right middle and the right anterior cerebral artery distribution vasospasm using intra-arterial verapamil.  Using biplane roadmap technique and constant fluoroscopic guidance, over a 0.035 inch Roadrunner guidewire, the 5 [place] JB1 catheter was advanced to the distal cervical segment of the right internal carotid artery. The guidewire was removed.  Good aspiration was obtained from the hub of the 5 [place] diagnostic catheter. A control arteriogram performed showed no change in the intracranial circulation.  At this time approximately 11 mL of verapamil was then infused slowly via a pump over approximately 30 minutes. Throughout the procedure, the patient's blood pressure and heart rate remained stable.  At the end of this, a control arteriogram performed through the 5 [place] guide catheter in the right internal carotid artery demonstrated a modestly improved caliber and flow through the right middle and the right anterior cerebral artery distribution. No abnormal filling defects were seen.  As far as noted was the release of the conical configuration at the origin of the inferior division and also the superior division of the right middle cerebral artery. An intracranial angioplasty was not performed in view of the unprotected nature of the etiology of the intracerebral and the subarachnoid hemorrhage.  The diagnostic catheter was then retrieved into the abdominal aorta and then removed. The 5 French Pinnacle sheath was then successfully retrieved with the application of an external closure device.  IMPRESSION: Severe vasospasm of the right middle cerebral artery distribution and moderate severe of the right anterior cerebral artery A1 segment with treatment and infusion intra-arterially of 11 mg of verapamil.  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   Electronically Signed   By: Luanne Bras M.D.   On: 11/20/2013 10:01   Ir 3d Primitivo Gauze Darreld Mclean  11/20/2013   CLINICAL DATA:  Right-sided subarachnoid hemorrhage and intra cerebral hemorrhage.  EXAM: IR ANGIO INTRA EXTRACRAN SEL INTERNAL CAROTID  UNI RIGHT MOD SED BILATERAL COMMON CAROTID ARTERY AND BILATERAL VERTEBRAL ARTERY ANGIOGRAMS FOLLOWED BY ENDOVASCULAR TREATMENT OF SEVERE  VASOSPASM OF THE RIGHT MIDDLE CEREBRAL ARTERY BRANCHES USING INTRA-ARTERIAL VERAPAMIL.  ANESTHESIA/SEDATION: General.  MEDICATIONS: As per general anesthesia.  CONTRAST:  163mL OMNIPAQUE IOHEXOL 300 MG/ML  SOLN  PROCEDURE: Following a full explanation of the procedure along with the potential associated complications, an informed witnessed consent was obtained from the patient's sister.  The right groin was prepped and draped in the usual sterile fashion. Thereafter using modified Seldinger technique, transfemoral access into the right common femoral artery was obtained without difficulty. Over a 0.035 inch guidewire, a 5 French Pinnacle sheath was inserted. Through this, and also over a 0.035 inch guidewire, a 5 French JB1 catheter was advanced to the aortic arch region and selectively positioned in the right common carotid artery, the right subclavian artery, the left common carotid artery and the left vertebral artery.  There were no acute complications. The patient tolerated the procedure well.  COMPLICATIONS: None immediate.  FINDINGS: The right subclavian arteriogram demonstrates the origin of the right vertebral artery to be patent. 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  The right common carotid arteriogram demonstrates the right external carotid artery and its major branches to be normal.  The right internal carotid artery at the bulb has a smooth shallow plaque without associated stenosis.  No ulcerations are seen.  The vessel is, otherwise, seen to opacify normally to the cranial skull base. The petrous and the cavernous segments are widely patent. There is mild smooth narrowing of the supraclinoid segment. The right middle cerebral artery proximally extending to the right middle cerebral artery M2 M3 branches demonstrate significantly attenuated caliber throughout the distribution with delayed  antegrade flow more distally compared to the right anterior cerebral artery distribution.  The right anterior cerebral A1 segment also demonstrates moderate to severe smooth narrowing. There is mild mass-effect on the right A2 segment probably related to the underlying hematoma. A 3D rotational arteriogram was then performed centered over the right middle cerebral artery distribution. This failed to elucidate angiographic evidence of an intracranial aneurysm at this time.  The left common carotid arteriogram demonstrates the left external carotid artery and its major branches to be normal.  The left internal carotid artery at the bulb demonstrates a smooth atherosclerotic plaque along the lateral posterior wall with an associated narrowing of approximately 10 to 20% by the NASCET criteria.  No acute ulcerations are seen. The vessel is, otherwise, seen to opacify normally to the cranial skull base. The petrous, the cavernous and the supraclinoid segments are widely patent.  The left middle cerebral artery and the left anterior cerebral artery opacify normally into the capillary and venous phases.  There is cross-filling via the anterior communicating artery of the right anterior cerebral artery A2 segment.  Again demonstrated is a mild right to left midline shift of the A2 segments of both anterior cerebral arteries as described earlier.  Arising in the left MCA bifurcation region is a bilobed aneurysm projecting anteriorly. This measures approximately 7 mm x 6 mm, with a small superiorly projecting daughter aneurysm. The left vertebral artery origin is normal.  The vessel opacifies normally to the cranial skull base. The left vertebrobasilar junction and the left posterior inferior cerebellar artery opacify normally. The basilar artery, the posterior cerebral arteries, the superior cerebellar arteries and the anterior inferior cerebellar  arteries opacify normally into the capillary and venous phases.  Small bulbous  configuration at the basilar artery apex probably represents confluence of the vessels arising from this region.  Anastomosis of the left anterior inferior cerebellar artery with the left posterior-inferior cerebellar artery complex is seen. There is a stump of a right vertebrobasilar junction with no antegrade clearance consistent with a right vertebral artery occlusion as described earlier.  ENDOVASCULAR TREATMENT OF SEVERE VASOSPASM OF THE RIGHT MIDDLE CEREBRAL ARTERY AND THE RIGHT ANTERIOR CEREBRAL ARTERY:  The angiographic findings were discussed with the referring neurosurgeon.  It was decided to improve perfusion into the right cerebral hemisphere to avoid a potential catastrophic ischemic infarct. However, consideration of the fact that a possible aneurysm in the right middle cerebral artery distribution had not been entirely excluded just because it had not been angiographically demonstrated this time.  After discussion with the family it was elected to proceed with treatment of the right middle and the right anterior cerebral artery distribution vasospasm using intra-arterial verapamil.  Using biplane roadmap technique and constant fluoroscopic guidance, over a 0.035 inch Roadrunner guidewire, the 5 [place] JB1 catheter was advanced to the distal cervical segment of the right internal carotid artery. The guidewire was removed. Good aspiration was obtained from the hub of the 5 [place] diagnostic catheter. A control arteriogram performed showed no change in the intracranial circulation.  At this time approximately 11 mL of verapamil was then infused slowly via a pump over approximately 30 minutes. Throughout the procedure, the patient's blood pressure and heart rate remained stable.  At the end of this, a control arteriogram performed through the 5 [place] guide catheter in the right internal carotid artery demonstrated a modestly improved caliber and flow through the right middle and the right anterior  cerebral artery distribution. No abnormal filling defects were seen.  As far as noted was the release of the conical configuration at the origin of the inferior division and also the superior division of the right middle cerebral artery. An intracranial angioplasty was not performed in view of the unprotected nature of the etiology of the intracerebral and the subarachnoid hemorrhage.  The diagnostic catheter was then retrieved into the abdominal aorta and then removed. The 5 French Pinnacle sheath was then successfully retrieved with the application of an external closure device.  IMPRESSION: Severe vasospasm of the right middle cerebral artery distribution and moderate severe of the right anterior cerebral artery A1 segment with treatment and infusion intra-arterially of 11 mg of verapamil.  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   Electronically Signed   By: Luanne Bras M.D.   On: 11/20/2013 10:01   Dg Chest Port 1 View  11/20/2013   CLINICAL DATA:  Respiratory failure, followup  EXAM: PORTABLE CHEST - 1 VIEW  COMPARISON:  Portable chest x-ray 11/20/2013  FINDINGS: The tip of the endotracheal tube is approximately 3.0 cm above the carina. Opacity has increased somewhat of the left lung base most consistent with atelectasis with slight elevation of the left hemidiaphragm. Heart size is stable. NG tube remains.  IMPRESSION: Slight increase in left basilar opacity most consistent with atelectasis.   Electronically Signed   By: Ivar Drape M.D.   On: 11/20/2013 07:59   Ir Angio Intra Extracran Sel Com Carotid Innominate Uni L Mod Sed  11/20/2013   CLINICAL DATA:  Right-sided subarachnoid  hemorrhage and intra cerebral hemorrhage.  EXAM: IR ANGIO INTRA EXTRACRAN SEL INTERNAL CAROTID UNI RIGHT MOD SED BILATERAL COMMON CAROTID ARTERY AND BILATERAL VERTEBRAL ARTERY ANGIOGRAMS FOLLOWED  BY ENDOVASCULAR TREATMENT OF SEVERE VASOSPASM OF THE RIGHT MIDDLE CEREBRAL ARTERY BRANCHES USING INTRA-ARTERIAL VERAPAMIL.  ANESTHESIA/SEDATION: General.  MEDICATIONS: As per general anesthesia.  CONTRAST:  159mL OMNIPAQUE IOHEXOL 300 MG/ML  SOLN  PROCEDURE: Following a full explanation of the procedure along with the potential associated complications, an informed witnessed consent was obtained from the patient's sister.  The right groin was prepped and draped in the usual sterile fashion. Thereafter using modified Seldinger technique, transfemoral access into the right common femoral artery was obtained without difficulty. Over a 0.035 inch guidewire, a 5 French Pinnacle sheath was inserted. Through this, and also over a 0.035 inch guidewire, a 5 French JB1 catheter was advanced to the aortic arch region and selectively positioned in the right common carotid artery, the right subclavian artery, the left common carotid artery and the left vertebral artery.  There were no acute complications. The patient tolerated the procedure well.  COMPLICATIONS: None immediate.  FINDINGS: The right subclavian arteriogram demonstrates the origin of the right vertebral artery to be patent. 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  The right common carotid arteriogram demonstrates the right external carotid artery and its major branches to be normal.  The right internal carotid artery at the bulb has a smooth shallow plaque without associated stenosis.  No ulcerations are seen.  The vessel is, otherwise, seen to opacify normally to the cranial skull base. The petrous and the cavernous segments are widely patent. There is mild smooth narrowing of the supraclinoid segment. The right middle cerebral artery proximally extending to the right middle cerebral artery M2 M3 branches demonstrate significantly attenuated caliber  throughout the distribution with delayed antegrade flow more distally compared to the right anterior cerebral artery distribution.  The right anterior cerebral A1 segment also demonstrates moderate to severe smooth narrowing. There is mild mass-effect on the right A2 segment probably related to the underlying hematoma. A 3D rotational arteriogram was then performed centered over the right middle cerebral artery distribution. This failed to elucidate angiographic evidence of an intracranial aneurysm at this time.  The left common carotid arteriogram demonstrates the left external carotid artery and its major branches to be normal.  The left internal carotid artery at the bulb demonstrates a smooth atherosclerotic plaque along the lateral posterior wall with an associated narrowing of approximately 10 to 20% by the NASCET criteria.  No acute ulcerations are seen. The vessel is, otherwise, seen to opacify normally to the cranial skull base. The petrous, the cavernous and the supraclinoid segments are widely patent.  The left middle cerebral artery and the left anterior cerebral artery opacify normally into the capillary and venous phases.  There is cross-filling via the anterior communicating artery of the right anterior cerebral artery A2 segment.  Again demonstrated is a mild right to left midline shift of the A2 segments of both anterior cerebral arteries as described earlier.  Arising in the left MCA bifurcation region is a bilobed aneurysm projecting anteriorly. This measures approximately 7 mm x 6 mm, with a small superiorly projecting daughter aneurysm. The left vertebral artery origin is normal.  The vessel opacifies normally to the cranial skull base. The left vertebrobasilar junction and the left posterior inferior cerebellar artery opacify normally. The basilar  artery, the posterior cerebral arteries, the superior cerebellar arteries and the anterior inferior cerebellar arteries opacify normally into the  capillary and venous phases.  Small bulbous configuration at the basilar artery apex probably represents confluence of the vessels arising from this region.  Anastomosis of the left anterior inferior cerebellar artery with the left posterior-inferior cerebellar artery complex is seen. There is a stump of a right vertebrobasilar junction with no antegrade clearance consistent with a right vertebral artery occlusion as described earlier.  ENDOVASCULAR TREATMENT OF SEVERE VASOSPASM OF THE RIGHT MIDDLE CEREBRAL ARTERY AND THE RIGHT ANTERIOR CEREBRAL ARTERY:  The angiographic findings were discussed with the referring neurosurgeon.  It was decided to improve perfusion into the right cerebral hemisphere to avoid a potential catastrophic ischemic infarct. However, consideration of the fact that a possible aneurysm in the right middle cerebral artery distribution had not been entirely excluded just because it had not been angiographically demonstrated this time.  After discussion with the family it was elected to proceed with treatment of the right middle and the right anterior cerebral artery distribution vasospasm using intra-arterial verapamil.  Using biplane roadmap technique and constant fluoroscopic guidance, over a 0.035 inch Roadrunner guidewire, the 5 [place] JB1 catheter was advanced to the distal cervical segment of the right internal carotid artery. The guidewire was removed. Good aspiration was obtained from the hub of the 5 [place] diagnostic catheter. A control arteriogram performed showed no change in the intracranial circulation.  At this time approximately 11 mL of verapamil was then infused slowly via a pump over approximately 30 minutes. Throughout the procedure, the patient's blood pressure and heart rate remained stable.  At the end of this, a control arteriogram performed through the 5 [place] guide catheter in the right internal carotid artery demonstrated a modestly improved caliber and flow through  the right middle and the right anterior cerebral artery distribution. No abnormal filling defects were seen.  As far as noted was the release of the conical configuration at the origin of the inferior division and also the superior division of the right middle cerebral artery. An intracranial angioplasty was not performed in view of the unprotected nature of the etiology of the intracerebral and the subarachnoid hemorrhage.  The diagnostic catheter was then retrieved into the abdominal aorta and then removed. The 5 French Pinnacle sheath was then successfully retrieved with the application of an external closure device.  IMPRESSION: Severe vasospasm of the right middle cerebral artery distribution and moderate severe of the right anterior cerebral artery A1 segment with treatment and infusion intra-arterially of 11 mg of verapamil.  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   Electronically Signed   By: Luanne Bras M.D.   On: 11/20/2013 10:01   Ir Angio Intra Extracran Sel Internal Carotid Uni R Mod Sed  11/20/2013   CLINICAL DATA:  Right-sided subarachnoid hemorrhage and intra cerebral hemorrhage.  EXAM: IR ANGIO INTRA EXTRACRAN SEL INTERNAL CAROTID UNI RIGHT MOD SED BILATERAL COMMON CAROTID ARTERY AND BILATERAL VERTEBRAL ARTERY ANGIOGRAMS FOLLOWED BY ENDOVASCULAR TREATMENT OF SEVERE VASOSPASM OF THE RIGHT MIDDLE CEREBRAL ARTERY BRANCHES USING INTRA-ARTERIAL VERAPAMIL.  ANESTHESIA/SEDATION: General.  MEDICATIONS: As per general anesthesia.  CONTRAST:  140mL OMNIPAQUE IOHEXOL 300 MG/ML  SOLN  PROCEDURE: Following a full explanation of the procedure along with the potential associated complications, an informed witnessed consent was obtained from the patient's sister.  The right groin was prepped and draped in the usual sterile fashion. Thereafter using modified Seldinger technique,  transfemoral access into the right common femoral artery was obtained without difficulty. Over a 0.035 inch guidewire, a 5 French Pinnacle sheath was inserted. Through this, and also over a 0.035 inch guidewire, a 5 [place] JB1 catheter was advanced to the aortic arch region and selectively positioned in the right common carotid artery, the right subclavian artery, the left common carotid artery and the left vertebral artery.  There were no acute complications. The patient tolerated the procedure well.  COMPLICATIONS: None immediate.  FINDINGS: The right subclavian arteriogram demonstrates the origin of the right vertebral artery to be patent. 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  The right common carotid arteriogram demonstrates the right external carotid artery and its major branches to be normal.  The right internal carotid artery at the bulb has a smooth shallow plaque without associated stenosis.  No ulcerations are seen.  The vessel is, otherwise, seen to opacify normally to the cranial skull base. The petrous and the cavernous segments are widely patent. There is mild smooth narrowing of the supraclinoid segment. The right middle cerebral artery proximally extending to the right middle cerebral artery M2 M3 branches demonstrate significantly attenuated caliber throughout the distribution with delayed antegrade flow more distally compared to the right anterior cerebral artery distribution.  The right anterior cerebral A1 segment also demonstrates moderate to severe smooth narrowing. There is mild mass-effect on the right A2 segment probably related to the underlying hematoma. A 3D rotational arteriogram was then performed centered over the right middle cerebral artery distribution. This failed to elucidate angiographic evidence of an intracranial aneurysm at this time.  The left common carotid  arteriogram demonstrates the left external carotid artery and its major branches to be normal.  The left internal carotid artery at the bulb demonstrates a smooth atherosclerotic plaque along the lateral posterior wall with an associated narrowing of approximately 10 to 20% by the NASCET criteria.  No acute ulcerations are seen. The vessel is, otherwise, seen to opacify normally to the cranial skull base. The petrous, the cavernous and the supraclinoid segments are widely patent.  The left middle cerebral artery and the left anterior cerebral artery opacify normally into the capillary and venous phases.  There is cross-filling via the anterior communicating artery of the right anterior cerebral artery A2 segment.  Again demonstrated is a mild right to left midline shift of the A2 segments of both anterior cerebral arteries as described earlier.  Arising in the left MCA bifurcation region is a bilobed aneurysm projecting anteriorly. This measures approximately 7 mm x 6 mm, with a small superiorly projecting daughter aneurysm. The left vertebral artery origin is normal.  The vessel opacifies normally to the cranial skull base. The left vertebrobasilar junction and the left posterior inferior cerebellar artery opacify normally. The basilar artery, the posterior cerebral arteries, the superior cerebellar arteries and the anterior inferior cerebellar arteries opacify normally into the capillary and venous phases.  Small bulbous configuration at the basilar artery apex probably represents confluence of the vessels arising from this region.  Anastomosis of the left anterior inferior cerebellar artery with the left posterior-inferior cerebellar artery complex is seen. There is a stump of a right vertebrobasilar junction with no antegrade clearance consistent with a right vertebral artery occlusion as described earlier.  ENDOVASCULAR TREATMENT OF SEVERE VASOSPASM  OF THE RIGHT MIDDLE CEREBRAL ARTERY AND THE RIGHT ANTERIOR  CEREBRAL ARTERY:  The angiographic findings were discussed with the referring neurosurgeon.  It was decided to improve perfusion into the right cerebral hemisphere to avoid a potential catastrophic ischemic infarct. However, consideration of the fact that a possible aneurysm in the right middle cerebral artery distribution had not been entirely excluded just because it had not been angiographically demonstrated this time.  After discussion with the family it was elected to proceed with treatment of the right middle and the right anterior cerebral artery distribution vasospasm using intra-arterial verapamil.  Using biplane roadmap technique and constant fluoroscopic guidance, over a 0.035 inch Roadrunner guidewire, the 5 [place] JB1 catheter was advanced to the distal cervical segment of the right internal carotid artery. The guidewire was removed. Good aspiration was obtained from the hub of the 5 [place] diagnostic catheter. A control arteriogram performed showed no change in the intracranial circulation.  At this time approximately 11 mL of verapamil was then infused slowly via a pump over approximately 30 minutes. Throughout the procedure, the patient's blood pressure and heart rate remained stable.  At the end of this, a control arteriogram performed through the 5 [place] guide catheter in the right internal carotid artery demonstrated a modestly improved caliber and flow through the right middle and the right anterior cerebral artery distribution. No abnormal filling defects were seen.  As far as noted was the release of the conical configuration at the origin of the inferior division and also the superior division of the right middle cerebral artery. An intracranial angioplasty was not performed in view of the unprotected nature of the etiology of the intracerebral and the subarachnoid hemorrhage.  The diagnostic catheter was then retrieved into the abdominal aorta and then removed. The 5 French Pinnacle sheath  was then successfully retrieved with the application of an external closure device.  IMPRESSION: Severe vasospasm of the right middle cerebral artery distribution and moderate severe of the right anterior cerebral artery A1 segment with treatment and infusion intra-arterially of 11 mg of verapamil.  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   Electronically Signed   By: Luanne Bras M.D.   On: 11/20/2013 10:01   Ir Angio Vertebral Sel Subclavian Innominate Uni R Mod Sed  11/20/2013   CLINICAL DATA:  Right-sided subarachnoid hemorrhage and intra cerebral hemorrhage.  EXAM: IR ANGIO INTRA EXTRACRAN SEL INTERNAL CAROTID UNI RIGHT MOD SED BILATERAL COMMON CAROTID ARTERY AND BILATERAL VERTEBRAL ARTERY ANGIOGRAMS FOLLOWED BY ENDOVASCULAR TREATMENT OF SEVERE VASOSPASM OF THE RIGHT MIDDLE CEREBRAL ARTERY BRANCHES USING INTRA-ARTERIAL VERAPAMIL.  ANESTHESIA/SEDATION: General.  MEDICATIONS: As per general anesthesia.  CONTRAST:  191mL OMNIPAQUE IOHEXOL 300 MG/ML  SOLN  PROCEDURE: Following a full explanation of the procedure along with the potential associated complications, an informed witnessed consent was obtained from the patient's sister.  The right groin was prepped and draped in the usual sterile fashion. Thereafter using modified Seldinger technique, transfemoral access into the right common femoral artery was obtained without difficulty. Over a 0.035 inch guidewire, a 5 French Pinnacle sheath was inserted. Through this, and also over a 0.035 inch guidewire, a 5 [place] JB1 catheter was advanced to the aortic arch region and selectively positioned in the right common carotid artery, the right subclavian artery, the left common carotid artery and the left vertebral artery.  There were no  acute complications. The patient tolerated the procedure well.  COMPLICATIONS: None immediate.   FINDINGS: The right subclavian arteriogram demonstrates the origin of the right vertebral artery to be patent. 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  The right common carotid arteriogram demonstrates the right external carotid artery and its major branches to be normal.  The right internal carotid artery at the bulb has a smooth shallow plaque without associated stenosis.  No ulcerations are seen.  The vessel is, otherwise, seen to opacify normally to the cranial skull base. The petrous and the cavernous segments are widely patent. There is mild smooth narrowing of the supraclinoid segment. The right middle cerebral artery proximally extending to the right middle cerebral artery M2 M3 branches demonstrate significantly attenuated caliber throughout the distribution with delayed antegrade flow more distally compared to the right anterior cerebral artery distribution.  The right anterior cerebral A1 segment also demonstrates moderate to severe smooth narrowing. There is mild mass-effect on the right A2 segment probably related to the underlying hematoma. A 3D rotational arteriogram was then performed centered over the right middle cerebral artery distribution. This failed to elucidate angiographic evidence of an intracranial aneurysm at this time.  The left common carotid arteriogram demonstrates the left external carotid artery and its major branches to be normal.  The left internal carotid artery at the bulb demonstrates a smooth atherosclerotic plaque along the lateral posterior wall with an associated narrowing of approximately 10 to 20% by the NASCET criteria.  No acute ulcerations are seen. The vessel is, otherwise, seen to opacify normally to the cranial skull base. The petrous, the cavernous and the supraclinoid segments are widely patent.  The left middle cerebral artery and the left anterior  cerebral artery opacify normally into the capillary and venous phases.  There is cross-filling via the anterior communicating artery of the right anterior cerebral artery A2 segment.  Again demonstrated is a mild right to left midline shift of the A2 segments of both anterior cerebral arteries as described earlier.  Arising in the left MCA bifurcation region is a bilobed aneurysm projecting anteriorly. This measures approximately 7 mm x 6 mm, with a small superiorly projecting daughter aneurysm. The left vertebral artery origin is normal.  The vessel opacifies normally to the cranial skull base. The left vertebrobasilar junction and the left posterior inferior cerebellar artery opacify normally. The basilar artery, the posterior cerebral arteries, the superior cerebellar arteries and the anterior inferior cerebellar arteries opacify normally into the capillary and venous phases.  Small bulbous configuration at the basilar artery apex probably represents confluence of the vessels arising from this region.  Anastomosis of the left anterior inferior cerebellar artery with the left posterior-inferior cerebellar artery complex is seen. There is a stump of a right vertebrobasilar junction with no antegrade clearance consistent with a right vertebral artery occlusion as described earlier.  ENDOVASCULAR TREATMENT OF SEVERE VASOSPASM OF THE RIGHT MIDDLE CEREBRAL ARTERY AND THE RIGHT ANTERIOR CEREBRAL ARTERY:  The angiographic findings were discussed with the referring neurosurgeon.  It was decided to improve perfusion into the right cerebral hemisphere to avoid a potential catastrophic ischemic infarct. However, consideration of the fact that a possible aneurysm in the right middle cerebral artery distribution had not been entirely excluded just because it had not been angiographically demonstrated this time.  After discussion with the family it was elected to proceed with treatment of the right middle  and the right anterior  cerebral artery distribution vasospasm using intra-arterial verapamil.  Using biplane roadmap technique and constant fluoroscopic guidance, over a 0.035 inch Roadrunner guidewire, the 5 [place] JB1 catheter was advanced to the distal cervical segment of the right internal carotid artery. The guidewire was removed. Good aspiration was obtained from the hub of the 5 [place] diagnostic catheter. A control arteriogram performed showed no change in the intracranial circulation.  At this time approximately 11 mL of verapamil was then infused slowly via a pump over approximately 30 minutes. Throughout the procedure, the patient's blood pressure and heart rate remained stable.  At the end of this, a control arteriogram performed through the 5 [place] guide catheter in the right internal carotid artery demonstrated a modestly improved caliber and flow through the right middle and the right anterior cerebral artery distribution. No abnormal filling defects were seen.  As far as noted was the release of the conical configuration at the origin of the inferior division and also the superior division of the right middle cerebral artery. An intracranial angioplasty was not performed in view of the unprotected nature of the etiology of the intracerebral and the subarachnoid hemorrhage.  The diagnostic catheter was then retrieved into the abdominal aorta and then removed. The 5 French Pinnacle sheath was then successfully retrieved with the application of an external closure device.  IMPRESSION: Severe vasospasm of the right middle cerebral artery distribution and moderate severe of the right anterior cerebral artery A1 segment with treatment and infusion intra-arterially of 11 mg of verapamil.  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   Electronically Signed   By: Luanne Bras M.D.   On: 11/20/2013 10:01    Ir Angio Vertebral Sel Vertebral Uni L Mod Sed  11/20/2013   CLINICAL DATA:  Right-sided subarachnoid hemorrhage and intra cerebral hemorrhage.  EXAM: IR ANGIO INTRA EXTRACRAN SEL INTERNAL CAROTID UNI RIGHT MOD SED BILATERAL COMMON CAROTID ARTERY AND BILATERAL VERTEBRAL ARTERY ANGIOGRAMS FOLLOWED BY ENDOVASCULAR TREATMENT OF SEVERE VASOSPASM OF THE RIGHT MIDDLE CEREBRAL ARTERY BRANCHES USING INTRA-ARTERIAL VERAPAMIL.  ANESTHESIA/SEDATION: General.  MEDICATIONS: As per general anesthesia.  CONTRAST:  163mL OMNIPAQUE IOHEXOL 300 MG/ML  SOLN  PROCEDURE: Following a full explanation of the procedure along with the potential associated complications, an informed witnessed consent was obtained from the patient's sister.  The right groin was prepped and draped in the usual sterile fashion. Thereafter using modified Seldinger technique, transfemoral access into the right common femoral artery was obtained without difficulty. Over a 0.035 inch guidewire, a 5 French Pinnacle sheath was inserted. Through this, and also over a 0.035 inch guidewire, a 5 [place] JB1 catheter was advanced to the aortic arch region and selectively positioned in the right common carotid artery, the right subclavian artery, the left common carotid artery and the left vertebral artery.  There were no acute complications. The patient tolerated the procedure well.  COMPLICATIONS: None immediate.  FINDINGS: The right subclavian arteriogram demonstrates the origin of the right vertebral artery to be patent. 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  The right common carotid arteriogram demonstrates the right external carotid artery and its major branches to be normal.  The right internal carotid artery at the bulb has a smooth shallow plaque without associated stenosis.  No ulcerations  are seen.  The vessel is, otherwise, seen to opacify  normally to the cranial skull base. The petrous and the cavernous segments are widely patent. There is mild smooth narrowing of the supraclinoid segment. The right middle cerebral artery proximally extending to the right middle cerebral artery M2 M3 branches demonstrate significantly attenuated caliber throughout the distribution with delayed antegrade flow more distally compared to the right anterior cerebral artery distribution.  The right anterior cerebral A1 segment also demonstrates moderate to severe smooth narrowing. There is mild mass-effect on the right A2 segment probably related to the underlying hematoma. A 3D rotational arteriogram was then performed centered over the right middle cerebral artery distribution. This failed to elucidate angiographic evidence of an intracranial aneurysm at this time.  The left common carotid arteriogram demonstrates the left external carotid artery and its major branches to be normal.  The left internal carotid artery at the bulb demonstrates a smooth atherosclerotic plaque along the lateral posterior wall with an associated narrowing of approximately 10 to 20% by the NASCET criteria.  No acute ulcerations are seen. The vessel is, otherwise, seen to opacify normally to the cranial skull base. The petrous, the cavernous and the supraclinoid segments are widely patent.  The left middle cerebral artery and the left anterior cerebral artery opacify normally into the capillary and venous phases.  There is cross-filling via the anterior communicating artery of the right anterior cerebral artery A2 segment.  Again demonstrated is a mild right to left midline shift of the A2 segments of both anterior cerebral arteries as described earlier.  Arising in the left MCA bifurcation region is a bilobed aneurysm projecting anteriorly. This measures approximately 7 mm x 6 mm, with a small superiorly projecting daughter aneurysm. The left vertebral artery origin is normal.  The vessel  opacifies normally to the cranial skull base. The left vertebrobasilar junction and the left posterior inferior cerebellar artery opacify normally. The basilar artery, the posterior cerebral arteries, the superior cerebellar arteries and the anterior inferior cerebellar arteries opacify normally into the capillary and venous phases.  Small bulbous configuration at the basilar artery apex probably represents confluence of the vessels arising from this region.  Anastomosis of the left anterior inferior cerebellar artery with the left posterior-inferior cerebellar artery complex is seen. There is a stump of a right vertebrobasilar junction with no antegrade clearance consistent with a right vertebral artery occlusion as described earlier.  ENDOVASCULAR TREATMENT OF SEVERE VASOSPASM OF THE RIGHT MIDDLE CEREBRAL ARTERY AND THE RIGHT ANTERIOR CEREBRAL ARTERY:  The angiographic findings were discussed with the referring neurosurgeon.  It was decided to improve perfusion into the right cerebral hemisphere to avoid a potential catastrophic ischemic infarct. However, consideration of the fact that a possible aneurysm in the right middle cerebral artery distribution had not been entirely excluded just because it had not been angiographically demonstrated this time.  After discussion with the family it was elected to proceed with treatment of the right middle and the right anterior cerebral artery distribution vasospasm using intra-arterial verapamil.  Using biplane roadmap technique and constant fluoroscopic guidance, over a 0.035 inch Roadrunner guidewire, the 5 [place] JB1 catheter was advanced to the distal cervical segment of the right internal carotid artery. The guidewire was removed. Good aspiration was obtained from the hub of the 5 [place] diagnostic catheter. A control arteriogram performed showed no change in the intracranial circulation.  At this time approximately 11 mL of verapamil was then infused slowly via a  pump over  approximately 30 minutes. Throughout the procedure, the patient's blood pressure and heart rate remained stable.  At the end of this, a control arteriogram performed through the 5 [place] guide catheter in the right internal carotid artery demonstrated a modestly improved caliber and flow through the right middle and the right anterior cerebral artery distribution. No abnormal filling defects were seen.  As far as noted was the release of the conical configuration at the origin of the inferior division and also the superior division of the right middle cerebral artery. An intracranial angioplasty was not performed in view of the unprotected nature of the etiology of the intracerebral and the subarachnoid hemorrhage.  The diagnostic catheter was then retrieved into the abdominal aorta and then removed. The 5 [place] Pinnacle sheath was then successfully retrieved with the application of an external closure device.  IMPRESSION: Severe vasospasm of the right middle cerebral artery distribution and moderate severe of the right anterior cerebral artery A1 segment with treatment and infusion intra-arterially of 11 mg of verapamil.  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   Electronically Signed   By: Luanne Bras M.D.   On: 11/20/2013 10:01    Assessment/Plan: Head CT shows edema vs. Evolving infarct.  I would like to increase volume and will increase IVF to 150 cc/hr.  OK to extubate patient.  OK to let BP go as high as 180, although we must balance between uncontrolled aneurysm and vasospasm.    LOS: 3 days    Peggyann Shoals, MD 11/21/2013, 7:27 AM

## 2013-11-21 NOTE — Progress Notes (Signed)
PULMONARY / CRITICAL CARE MEDICINE   Name: Carla Hoover MRN: [id number] DOB: [birth year]    ADMISSION DATE:  12/05/2013 CONSULTATION DATE:  11/18/2013  REFERRING MD :  Erline Levine  CHIEF COMPLAINT:  Weakness  INITIAL PRESENTATION:  63 yo female fell and hit her head one day prior to admit.  Presented to [facility] with Lt sided weakness and facial drop.  CT head showed ICH with SAH.  She required intubation prior to transfer to [facility].  PCCM consulted for vent/medical management.  STUDIES:  9/06 CT head ([place]) 9/07 Cerebral arteriogram >> 7 mm Lt MCA Aneurysm, vasospasm of Rt MCA 9/09 CT head >> no change in ICH, SAH, SDH  SIGNIFICANT EVENTS: 9/06 Transfer from [place] to [facility]  SUBJECTIVE:  Tolerating SBT.  VITAL SIGNS: Temp:  [98 F (36.7 C)-99.5 F (37.5 C)] 98 F (36.7 C) (09/09 0757) Pulse Rate:  [47-74] 53 (09/09 0900) Resp:  [13-28] 14 (09/09 0900) BP: (111-178)/(47-90) 131/58 mmHg (09/09 0900) SpO2:  [98 %-100 %] 100 % (09/09 0900) Arterial Line BP: (81-169)/(42-70) 149/56 mmHg (09/09 0900) FiO2 (%):  [30 %] 30 % (09/09 0327) VENTILATOR SETTINGS: Vent Mode:  [-] PRVC FiO2 (%):  [30 %] 30 % Set Rate:  [14 bmp] 14 bmp Vt Set:  [500 mL] 500 mL PEEP:  [5 cmH20] 5 cmH20 Pressure Support:  [8 cmH20] 8 cmH20 Plateau Pressure:  [16 cmH20] 16 cmH20 INTAKE / OUTPUT:  Intake/Output Summary (Last 24 hours) at 11/21/13 0952 Last data filed at 11/21/13 0800  Gross per 24 hour  Intake 1176.34 ml  Output   1475 ml  Net -298.66 ml    PHYSICAL EXAMINATION: General: no distress Neuro: alert, follows simple commands HEENT: ETT in place Cardiovascular: regular Lungs: no wheeze Abdomen:  Soft, non tender Musculoskeletal: no edema Skin:  No rashes   LABS:  CBC  Recent Labs Lab 11/16/2013 0245 11/20/13 0550 11/21/13 0500  WBC 7.1 8.0 7.8  HGB 11.1* 9.6* 9.9*  HCT 32.0* 28.0* 28.6*  PLT 186 248 215   Coag's  Recent Labs Lab 12/01/2013 1900  INR  0.93   BMET  Recent Labs Lab 11/28/2013 0245 11/20/13 0550 11/21/13 0500  NA 128* 131* 131*  K 3.7 3.2* 2.7*  CL 92* 96 95*  CO2 20 22 22   BUN 11 10 9   CREATININE 0.98 0.82 0.72  GLUCOSE 148* 109* 124*   Electrolytes  Recent Labs Lab 11/28/2013 1900 11/23/2013 0245 11/20/13 0550 11/21/13 0500  CALCIUM 8.5 8.0* 7.2* 7.0*  MG 1.0*  --   --   --    ABG  Recent Labs Lab 11/25/2013 2014  PHART 7.425  PCO2ART 34.2*  PO2ART 216.0*   Liver Enzymes  Recent Labs Lab 11/18/13 1900 11/20/13 0550  AST 74* 49*  ALT 37* 24  ALKPHOS 122* 86  BILITOT 1.2 0.9  ALBUMIN 3.3* 2.6*   Imaging Ct Head Wo Contrast  11/20/2013   CLINICAL DATA:  Followup intracranial hemorrhage.  EXAM: CT HEAD WITHOUT CONTRAST  TECHNIQUE: Contiguous axial images were obtained from the base of the skull through the vertex without intravenous contrast.  COMPARISON:  11/23/2013.  FINDINGS: Right frontal parenchymal hematoma is unchanged in size. Hemorrhage extends to the subarachnoid space over the right frontal, parietal and temporal lobes. Subdural hemorrhage tracks along the interhemispheric fissure. Subarachnoid hemorrhage in the posterior fossa is evident along the right pre pontine cistern extending toward the right cerebellopontine ankle, and in the left quadrigeminal plate cistern. There is  no evidence of new intracranial hemorrhage. No evidence of an ischemic infarct.  Vasogenic edema surrounding the right frontal parenchymal hematoma similar. Mass effect with partial effacement of the right lateral ventricle and 4.8 mm of left midline shift are without substantial change.  Visualized sinuses and mastoid air cells are clear.  IMPRESSION: 1. No change in the intracranial hemorrhage since the most recent prior study. Right frontal parenchymal hematoma is stable in size as is the degree of mass effect. Subarachnoid hemorrhage and areas of subdural hemorrhage are stable. No new intracranial hemorrhage. 2. Mass  effect and midline shift ir stable. 3. No hydrocephalus.   Electronically Signed   By: Lajean Manes M.D.   On: 11/20/2013 09:03   Dg Chest Port 1 View  11/20/2013   CLINICAL DATA:  Respiratory failure, followup  EXAM: PORTABLE CHEST - 1 VIEW  COMPARISON:  Portable chest x-ray 12/02/2013  FINDINGS: The tip of the endotracheal tube is approximately 3.0 cm above the carina. Opacity has increased somewhat of the left lung base most consistent with atelectasis with slight elevation of the left hemidiaphragm. Heart size is stable. NG tube remains.  IMPRESSION: Slight increase in left basilar opacity most consistent with atelectasis.   Electronically Signed   By: Ivar Drape M.D.   On: 11/20/2013 07:59     ASSESSMENT / PLAN: NEUROLOGIC A:   ICH with SAH. Hx of anxiety, depression, insomnia. Hx of ETOH. P:   Valproate for seizure prevention per neurology Nimotop per neurosurgery F/u TCD's Continue prozac Add CIWA protocol after extubation Thiamine, folic acid  PULMONARY OETT 9/06 >> 9/09 A: Acute respiratory failure in setting of ICH. Atelectasis. Hx of COPD, tobacco abuse. P:   Proceed with extubation 9/09 F/u CXR intermittently PRN albuterol Hold outpt advair for now  CARDIOVASCULAR Lt radial aline 9/07 >> 9/09 A:  Hx of CAD, hyperlipidemia, HTN, chronic systolic heart failure, AAA. P:  Monitor hemodynamics Hold ASA, plavix in setting of ICH until okay with neurology/neurosurgery Continue lopressor, zocor (uses pravachol as outpt)  Hold imdur, lasix for now  RENAL A:   CKD. Hyponatremia >> improved. Hypokalemia. P:   Monitor renal fx, urine outpt, electrolytes  GASTROINTESTINAL A:   Nutrition. Hx of IBS. P:   NPO Protonix for SUP Speech to assess swallowing after extubation >> will need panda tube if she has trouble with swallowing  HEMATOLOGIC A:   Anemia of critical illness. P:  Okay to add lovenox for DVT prevention per neurology F/u CBC  INFECTIOUS A:    No evidence for infection. P:   Monitor clinically  ENDOCRINE A:   No acute issues.   P:   Monitor blood sugar on BMET  TODAY'S SUMMARY: Proceed with extubation.  Will need to monitor for ETOH withdrawal.  D/w Dr. Leonie Man.  CC time 35 minutes.  Chesley Mires, MD [facility] Pulmonary/Critical Care 11/21/2013, 9:52 AM Pager:  [phone] After 3pm call: [phone]

## 2013-11-21 NOTE — Progress Notes (Signed)
Rehab Admissions Coordinator Note:  Patient was screened by Andromeda Poppen L for appropriateness for an Inpatient Acute Rehab Consult.  At this time, we are recommending Inpatient Rehab consult.  Laure Leone L 11/21/2013, 4:36 PM  I can be reached at [phone].

## 2013-11-21 NOTE — Evaluation (Signed)
Speech Language Pathology Evaluation Patient Details Name: Carla Hoover MRN: [id number] DOB: 1950/10/02 Today's Date: 11/21/2013 Time: 7824-2353 SLP Time Calculation (min): 19 min  Problem List:  Patient Active Problem List   Diagnosis Date Noted  . ICH (intracerebral hemorrhage) 12/03/2013  . GERD (gastroesophageal reflux disease) 01/18/2013  . Unspecified constipation 01/18/2013  . Abnormal CT scan, colon 01/18/2013  . Ganglion cyst of wrist 06/15/2012  . Tobacco use disorder 05/25/2012  . Renal insufficiency 05/25/2012  . Liver mass 05/19/2012  . AAA (abdominal aortic aneurysm) 05/19/2012  . NSTEMI (non-ST elevated myocardial infarction) 05/18/2012  . Hyperlipidemia   . CAD (coronary artery disease)   . CHF (congestive heart failure)    Past Medical History:  Past Medical History  Diagnosis Date  . Hypertension     a. Hypotension noted 05/2012.  Marland Kitchen Anxiety   . Depression   . Arthritis   . Irritable bowel syndrome (IBS)   . Diverticulitis   . Insomnia   . Eczema   . Gout   . GI bleed     Possible mild GI bleed 04/2011, hemoccult negative at [facility] and no colonoscopy/EGD felt necessary by GI at that time  . Ischemic cardiomyopathy     IMPROVED. a. EF 40-45% by echo at [facility] 04/2011. b. Improved to 55% by echo 05/2012.  Marland Kitchen CAD (coronary artery disease)     a. s/p two overlapping BMS to LAD 05/12/11, Occluded mid LCX with collaterals. b. NSTEMI 05/2012: PTCA/DES to mid Cx and PTCA/DES to prox-mid LAD 05/18/12.  Marland Kitchen Hyperlipidemia   . AAA (abdominal aortic aneurysm) 05/19/2012    a. Infrarenal AAA by CT at [facility] 3.4cm 05/2012.  . Tobacco abuse   . Hepatic lesion     a. Right hepatic lobe density of uncertain significance by CT at [facility] 05/2012 - needs f/u PCP.  Marland Kitchen Acute renal insufficiency     05/2012 - Cr 1.7. Improved.  . Tobacco use disorder 05/25/2012  . GERD (gastroesophageal reflux disease) 01/18/2013   Past Surgical History:  Past Surgical History   Procedure Laterality Date  . Abdominal hysterectomy    . Cardiac catheterization    . Coronary angioplasty  05/2011    2 BMS to mid LAD  . Colonoscopy      about 10 years, Dr. Hinton Lovely  . Colonoscopy N/A 02/15/2013    Procedure: COLONOSCOPY;  Surgeon: Daneil Dolin, MD;  [facility];  Service: Endoscopy;  Laterality: N/A;  2:15-moved to [place] notified pt  . Esophagogastroduodenoscopy (egd) with esophageal dilation N/A 02/15/2013    Procedure: ESOPHAGOGASTRODUODENOSCOPY (EGD) WITH ESOPHAGEAL DILATION;  Surgeon: Daneil Dolin, MD;  Location: [facility];  Service: Endoscopy;  Laterality: N/A;  . Radiology with anesthesia N/A 11/26/2013    Procedure: RADIOLOGY WITH ANESTHESIA;  Surgeon: Rob Hickman, MD;  Location: [facility];  Service: Radiology;  Laterality: N/A;   HPI:  63 y.o. 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 She has surrounding cytotoxic edema and midline shift and is employed left MCA aneurysm as well.    Assessment / Plan / Recommendation Clinical Impression  Pt's decreased level of alertness impacts all higher levels of cognition at this time, although she does follow one-step commands with minimal cueing from SLP once she is aroused. Speech is imprecise and very  low in intensity due to decreased breath support, significantly reducing intelligibility at the word level at this time. SLP will continue to follow for speech and cognitive therapy with focus on differential diagnosis of abilities as alertness improves.    SLP Assessment  Patient needs continued Speech Lanaguage Pathology Services    Follow Up Recommendations  Inpatient Rehab    Frequency and Duration min 3x week  1 week   Pertinent Vitals/Pain Pain Assessment: Faces Faces Pain Scale: No hurt    SLP Goals  SLP Goals Potential to Achieve Goals: Good Potential Considerations: Ability to learn/carryover information Progress/Goals/Alternative treatment plan discussed with pt/caregiver and they: Agree  SLP Evaluation Prior Functioning  Cognitive/Linguistic Baseline: Information not available   Cognition  Overall Cognitive Status: Impaired/Different from baseline Arousal/Alertness: Lethargic (suspect impacted by medications) Orientation Level: Oriented X4 Attention: Focused;Sustained Focused Attention: Impaired Focused Attention Impairment: Verbal basic;Functional basic Sustained Attention: Impaired Sustained Attention Impairment: Verbal basic;Functional basic Problem Solving: Impaired Problem Solving Impairment: Functional basic    Comprehension  Auditory Comprehension Overall Auditory Comprehension: Impaired Commands: Impaired One Step Basic Commands: 75-100% accurate Interfering Components: Other (comment) (level of alertness) Visual Recognition/Discrimination Discrimination: Not tested Reading Comprehension Reading Status: Not tested    Expression Expression Primary Mode of Expression: Verbal Verbal Expression Overall Verbal Expression: Other (comment) (difficult to assess given lethargy/dysarthria) Level of Generative/Spontaneous Verbalization: Word Interfering Components: Other (comment) (level of alertness) Written Expression Written Expression: Not tested   Oral / Motor Oral Motor/Sensory Function Overall Oral Motor/Sensory Function: Impaired Labial ROM: Reduced left Labial Symmetry: Abnormal symmetry left Labial Strength: Reduced Labial Sensation: Reduced Facial Symmetry: Left droop Motor Speech Overall Motor Speech: Impaired Respiration: Impaired Level of Impairment: Word Phonation: Low vocal intensity Articulation: Impaired Level of Impairment: Word Intelligibility: Intelligibility reduced Word: 0-24% accurate   GO      Germain Osgood,  M.A. CCC-SLP [phone]  Germain Osgood 11/21/2013, 3:45 PM

## 2013-11-21 NOTE — Progress Notes (Signed)
2 Days Post-Op  Subjective: CVA 9/7 Rt MCA vasospasm-- IA verapamil in IR Also noted L MCA aneurysm Now extubated Moving all extr except left arm/hand  Objective: Vital signs in last 24 hours: Temp:  [98 F (36.7 C)-99.5 F (37.5 C)] 98 F (36.7 C) (09/09 0757) Pulse Rate:  [47-74] 53 (09/09 0900) Resp:  [13-28] 14 (09/09 0900) BP: (111-178)/(48-90) 131/58 mmHg (09/09 0900) SpO2:  [98 %-100 %] 100 % (09/09 0900) Arterial Line BP: (81-169)/(42-70) 149/56 mmHg (09/09 0900) FiO2 (%):  [30 %] 30 % (09/09 0327) Last BM Date: 11/21/13  Intake/Output from previous day: 09/08 0701 - 09/09 0700 In: 1152.5 [I.V.:752.5; NG/GT:190; IV Piggyback:210] Out: 1191 [Urine:1675] Intake/Output this shift: Total I/O In: 380 [I.V.:225; IV Piggyback:155] Out: -   PE: afeb; vss CT 9/9: no change in SDH Extubated Moves B legs/feet to command  Moves Rt hand/arm to command Lt hand/arm no movement Still with definite Rt facial droop Can follow me with eyes; still unable to follow completely to Left UOP good Labs stable  Lab Results:   Recent Labs  11/20/13 0550 11/21/13 0500  WBC 8.0 7.8  HGB 9.6* 9.9*  HCT 28.0* 28.6*  PLT 248 215   BMET  Recent Labs  11/20/13 0550 11/21/13 0500  NA 131* 131*  K 3.2* 2.7*  CL 96 95*  CO2 22 22  GLUCOSE 109* 124*  BUN 10 9  CREATININE 0.82 0.72  CALCIUM 7.2* 7.0*   PT/INR  Recent Labs  11/13/2013 1900  LABPROT 12.5  INR 0.93   ABG  Recent Labs  11/18/2013 2014  PHART 7.425  HCO3 22.0    Studies/Results: Ct Head Wo Contrast  11/21/2013   CLINICAL DATA:  Followup intracranial hemorrhage  EXAM: CT HEAD WITHOUT CONTRAST  TECHNIQUE: Contiguous axial images were obtained from the base of the skull through the vertex without intravenous contrast.  COMPARISON:  Prior CT from 11/20/2013  FINDINGS: Right frontal parenchymal hematoma is grossly similar in size as compared to prior study. Again, hemorrhages is seen within the subarachnoid  space over the right frontal, parietal, and temporal lobes. Subdural hemorrhage tracks along the anterior falx. Subarachnoid hemorrhage within the right pre pontine cistern extending towards the right cerebellopontine angle also noted. Hemorrhage present within the left quadrigeminal plate cistern as well. Overall, amount of hemorrhage is not significantly changed. No definite new hemorrhage.  Vasogenic edema surrounding the right frontal parenchymal hematoma is overall similar in volume but slightly more well defined, compatible with normal expected interval changes. Associated right-to-left midline shift is similar measuring 4-5 mm.  No definite new acute large vessel territory infarct.  Calvarium and scalp soft tissues remain within normal limits. No acute abnormality seen about the orbits. Paranasal sinuses and mastoid air cells are clear.  IMPRESSION: 1. No significant interval change in intraparenchymal, subarachnoid, and subdural hemorrhage as compared to 11/20/13. Associated vasogenic edema is similar with stable 4-5 mm of right-to-left midline shift. No hydrocephalus. 2. No new hemorrhage.   Electronically Signed   By: Jeannine Boga M.D.   On: 11/21/2013 04:54   Ct Head Wo Contrast  11/20/2013   CLINICAL DATA:  Followup intracranial hemorrhage.  EXAM: CT HEAD WITHOUT CONTRAST  TECHNIQUE: Contiguous axial images were obtained from the base of the skull through the vertex without intravenous contrast.  COMPARISON:  11/19/2013.  FINDINGS: Right frontal parenchymal hematoma is unchanged in size. Hemorrhage extends to the subarachnoid space over the right frontal, parietal and temporal lobes. Subdural hemorrhage tracks  along the interhemispheric fissure. Subarachnoid hemorrhage in the posterior fossa is evident along the right pre pontine cistern extending toward the right cerebellopontine ankle, and in the left quadrigeminal plate cistern. There is no evidence of new intracranial hemorrhage. No evidence  of an ischemic infarct.  Vasogenic edema surrounding the right frontal parenchymal hematoma similar. Mass effect with partial effacement of the right lateral ventricle and 4.8 mm of left midline shift are without substantial change.  Visualized sinuses and mastoid air cells are clear.  IMPRESSION: 1. No change in the intracranial hemorrhage since the most recent prior study. Right frontal parenchymal hematoma is stable in size as is the degree of mass effect. Subarachnoid hemorrhage and areas of subdural hemorrhage are stable. No new intracranial hemorrhage. 2. Mass effect and midline shift ir stable. 3. No hydrocephalus.   Electronically Signed   By: Lajean Manes M.D.   On: 11/20/2013 09:03   Dg Chest Port 1 View  11/21/2013   CLINICAL DATA:  Atelectasis, followup  EXAM: PORTABLE CHEST - 1 VIEW  COMPARISON:  Portable chest x-ray of 9 8 and 12/06/2013  FINDINGS: Aeration of the left lung base has improved slightly with mild basilar atelectasis remaining. The tip of the endotracheal tube is approximately 4.3 cm above the carina. NG tube extends below the hemidiaphragm. Heart size is stable.  IMPRESSION: Improved aeration of the left lung base with mild basilar atelectasis remaining   Electronically Signed   By: Ivar Drape M.D.   On: 11/21/2013 08:09   Dg Chest Port 1 View  11/20/2013   CLINICAL DATA:  Respiratory failure, followup  EXAM: PORTABLE CHEST - 1 VIEW  COMPARISON:  Portable chest x-ray 11/28/2013  FINDINGS: The tip of the endotracheal tube is approximately 3.0 cm above the carina. Opacity has increased somewhat of the left lung base most consistent with atelectasis with slight elevation of the left hemidiaphragm. Heart size is stable. NG tube remains.  IMPRESSION: Slight increase in left basilar opacity most consistent with atelectasis.   Electronically Signed   By: Ivar Drape M.D.   On: 11/20/2013 07:59    Anti-infectives: Anti-infectives   Start     Dose/Rate Route Frequency Ordered Stop    12/06/2013 1145  vancomycin (VANCOCIN) IVPB 1000 mg/200 mL premix     1,000 mg 200 mL/hr over 60 Minutes Intravenous  Once 11/13/2013 1139 12/09/2013 1343      Assessment/Plan: s/p Procedure(s): RADIOLOGY WITH ANESTHESIA (N/A)  CVA 9/7 R MCA vasospasm- IA verapamil in IR L MCA aneurysm Extubated now Will follow Will report to Dr Estanislado Pandy   LOS: 3 days    Oluwademilade Mckiver A 11/21/2013

## 2013-11-21 NOTE — Progress Notes (Signed)
11/21/2013- Resp care note- Pt suctioned and extubated at 1035 as per MD order.  Pt tolerated well with pt vocalizing post extubation.  Hr59, rr20, bp166/65, sats 100% on 4lpm post extubation.  Vent on stand/by at bedside.  Will monitor progress and wean oxygen as tolerated.  s Kayne Yuhas rrt, rcp

## 2013-11-21 NOTE — Progress Notes (Signed)
OT Cancellation Note  Patient Details Name: Carla Hoover MRN: [id number] DOB: [birth year]   Cancelled Treatment:    Reason Eval/Treat Not Completed: Patient not medically ready (vent bedrest order) OT will continue to follow and await incr activity orders  [name] Pager: [phone]  11/21/2013, 7:36 AM

## 2013-11-21 NOTE — Evaluation (Signed)
Clinical/Bedside Swallow Evaluation Patient Details  Name: Carla Hoover MRN: [id number] Date of Birth: [birth year]  Today's Date: 11/21/2013 Time: 1443-1500 SLP Time Calculation (min): 17 min  Past Medical History:  Past Medical History  Diagnosis Date  . Hypertension     a. Hypotension noted 05/2012.  Marland Kitchen Anxiety   . Depression   . Arthritis   . Irritable bowel syndrome (IBS)   . Diverticulitis   . Insomnia   . Eczema   . Gout   . GI bleed     Possible mild GI bleed 04/2011, hemoccult negative at [facility] and no colonoscopy/EGD felt necessary by GI at that time  . Ischemic cardiomyopathy     IMPROVED. a. EF 40-45% by echo at [facility] 04/2011. b. Improved to 55% by echo 05/2012.  Marland Kitchen CAD (coronary artery disease)     a. s/p two overlapping BMS to LAD 05/12/11, Occluded mid LCX with collaterals. b. NSTEMI 05/2012: PTCA/DES to mid Cx and PTCA/DES to prox-mid LAD 05/18/12.  Marland Kitchen Hyperlipidemia   . AAA (abdominal aortic aneurysm) 05/19/2012    a. Infrarenal AAA by CT at [facility] 3.4cm 05/2012.  . Tobacco abuse   . Hepatic lesion     a. Right hepatic lobe density of uncertain significance by CT at [facility] 05/2012 - needs f/u PCP.  Marland Kitchen Acute renal insufficiency     05/2012 - Cr 1.7. Improved.  . Tobacco use disorder 05/25/2012  . GERD (gastroesophageal reflux disease) 01/18/2013   Past Surgical History:  Past Surgical History  Procedure Laterality Date  . Abdominal hysterectomy    . Cardiac catheterization    . Coronary angioplasty  05/2011    2 BMS to mid LAD  . Colonoscopy      about 10 years, Dr. Hinton Lovely  . Colonoscopy N/A 02/15/2013    Procedure: COLONOSCOPY;  Surgeon: Daneil Dolin, MD;  Location: [facility];  Service: Endoscopy;  Laterality: N/A;  2:15-moved to [place] notified pt  . Esophagogastroduodenoscopy (egd) with esophageal dilation N/A 02/15/2013    Procedure: ESOPHAGOGASTRODUODENOSCOPY (EGD) WITH ESOPHAGEAL DILATION;  Surgeon: Daneil Dolin, MD;  Location: [facility]  ENDO [facility];  Service: Endoscopy;  Laterality: N/A;  . Radiology with anesthesia N/A 11/26/2013    Procedure: RADIOLOGY WITH ANESTHESIA;  Surgeon: Rob Hickman, MD;  Location: [facility];  Service: Radiology;  Laterality: N/A;   HPI:  63 y.o. 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 She has surrounding cytotoxic edema and midline shift and is employed left MCA aneurysm as well.    Assessment / Plan / Recommendation Clinical Impression  Pt has a high aspiration risk at this time due to decreased alertness impacting her ability to orally accept POs and manage her secretions. Mild improvements in arousal was noted after SLP provided Total A for oral care. Decreased bolus awareness is further impacted by left-sided sensorimotor impairments, with most boluses being anteriorly spilled. Oral holding was noted with no attempts at swallow initiation despite Max cueing from SLP, although suspect premature spillage given delayed coughing. SLP will continue to follow to assess PO readiness versus need for objective testing as alertness improves.    Aspiration Risk  Severe    Diet Recommendation NPO   Medication Administration: Via alternative means    Other  Recommendations Oral Care Recommendations: Oral care Q4 per  protocol   Follow Up Recommendations  Inpatient Rehab    Frequency and Duration min 3x week  1 week   Pertinent Vitals/Pain RR fluctuating into the high 30s    SLP Swallow Goals     Swallow Study Prior Functional Status       General Date of Onset: 11/26/2013 HPI: 63 y.o. 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 She has surrounding cytotoxic edema and midline shift and is employed left MCA aneurysm as well.  Type of Study: Bedside swallow evaluation Previous Swallow Assessment: none in chart Diet Prior to this Study: NPO Temperature Spikes Noted: No Respiratory Status: Nasal cannula History of Recent Intubation: Yes Length of Intubations (days): 4 days Date extubated: 11/21/13 Behavior/Cognition: Cooperative;Lethargic;Requires cueing;Decreased sustained attention Self-Feeding Abilities: Total assist Patient Positioning: Upright in bed Baseline Vocal Quality: Low vocal intensity;Other (comment) (dysphonic) Volitional Cough: Weak (little to no force achieved despite attempts)    Oral/Motor/Sensory Function Overall Oral Motor/Sensory Function: Impaired Labial ROM: Reduced left Labial Symmetry: Abnormal symmetry left Labial Strength: Reduced Labial Sensation: Reduced Facial Symmetry: Left droop   Ice Chips Ice chips: Impaired Presentation: Spoon Oral Phase Impairments: Reduced labial seal;Poor awareness of bolus Oral Phase Functional Implications: Oral holding;Right anterior spillage;Left anterior spillage Pharyngeal Phase Impairments: Unable to trigger swallow   Thin Liquid Thin Liquid: Impaired Presentation: Cup;Spoon Oral Phase Impairments: Reduced labial seal;Poor awareness of bolus Oral Phase Functional Implications: Right anterior spillage;Left anterior spillage;Oral holding Pharyngeal  Phase Impairments: Unable to trigger swallow    Nectar Thick Nectar Thick Liquid: Not tested   Honey Thick Honey Thick Liquid: Not tested   Puree Puree: Not tested   Solid   GO    Solid: Not tested        Germain Osgood, M.A. CCC-SLP [phone]  Germain Osgood 11/21/2013,3:32 PM

## 2013-11-21 NOTE — Evaluation (Signed)
Physical Therapy Evaluation Patient Details Name: Carla Hoover MRN: [id number] DOB: 09/21/1950 Today's Date: 11/21/2013   History of Present Illness  63 yo female fell and hit her head one day prior to admit.  Presented to [facility] with Lt sided weakness and facial drop.  CT head showed ICH with SAH.  She required intubation prior to transfer to [facility]. Angiogram shows 7 mm Lt MCA Aneurysm, vasospasm of Rt MCA. Continued work-up underway.  Clinical Impression  Pt admitted with/for SAH post fall.  Pt currently limited functionally due to the problems listed. ( See problems list.)   Pt will benefit from PT to maximize function and safety in order to get ready for next venue listed below.     Follow Up Recommendations CIR    Equipment Recommendations  Other (comment) (TBA)    Recommendations for Other Services Rehab consult     Precautions / Restrictions Precautions Precautions: Fall      Mobility  Bed Mobility Overal bed mobility: Needs Assistance Bed Mobility: Supine to Sit;Sit to Supine     Supine to sit: Max assist;+2 for physical assistance Sit to supine: Max assist;+2 for physical assistance   General bed mobility comments: Due to lethargy, pt needed significant assist to EOB and the same to return due to fatigue.  Transfers Overall transfer level: Needs assistance   Transfers: Sit to/from Stand Sit to Stand: Mod assist;+2 physical assistance         General transfer comment: times 2, with cues for direction and to help stay on task  Ambulation/Gait             General Gait Details: not able today  Stairs            Wheelchair Mobility    Modified Rankin (Stroke Patients Only) Modified Rankin (Stroke Patients Only) Pre-Morbid Rankin Score: No symptoms Modified Rankin: Moderately severe disability     Balance Overall balance assessment: Needs assistance Sitting-balance support: Feet supported;No upper extremity supported Sitting  balance-Leahy Scale: Fair Sitting balance - Comments: v/t cues to attain upright sitting.  Can not except challenge   Standing balance support: No upper extremity supported Standing balance-Leahy Scale: Poor Standing balance comment: stood x2 at EOB,  With assist able to w/shift and unweight/step with each foot.  But not participative enought to walk away from bed.                             Pertinent Vitals/Pain Pain Assessment: Faces Faces Pain Scale: No hurt    Home Living Family/patient expects to be discharged to:: Private residence Living Arrangements: Alone                    Prior Function Level of Independence: Independent               Hand Dominance        Extremity/Trunk Assessment   Upper Extremity Assessment: LUE deficits/detail (No voluntary or spontaneous movement)           Lower Extremity Assessment: LLE deficits/detail;Generalized weakness   LLE Deficits / Details: minimal strength testing, but at least 3/5 gross extention     Communication   Communication: No difficulties  Cognition Arousal/Alertness: Lethargic (8-10 minutes to get alert--during this time sats dropping  ) Behavior During Therapy: Flat affect Overall Cognitive Status: Impaired/Different from baseline Area of Impairment: Attention;Safety/judgement;Orientation;Problem solving   Current Attention Level: Sustained;Focused  General Comments General comments (skin integrity, edema, etc.): pt with R gaze preference with some inattention to the L, but pt able to scan L and focus on something in the extreme L field..    Exercises        Assessment/Plan    PT Assessment Patient needs continued PT services  PT Diagnosis Difficulty walking;Generalized weakness (hemiparetic L UE, L LE weaker than R Le)   PT Problem List Decreased strength;Decreased activity tolerance;Decreased balance;Decreased mobility;Decreased  coordination;Cardiopulmonary status limiting activity  PT Treatment Interventions Functional mobility training;DME instruction;Therapeutic activities;Balance training;Neuromuscular re-education;Patient/family education   PT Goals (Current goals can be found in the Care Plan section) Acute Rehab PT Goals Patient Stated Goal: pt unable to state PT Goal Formulation: Patient unable to participate in goal setting Time For Goal Achievement: 12/05/13 Potential to Achieve Goals: Fair    Frequency Min 3X/week   Barriers to discharge  (Supposedly lives alone--TBD)      Co-evaluation               End of Session   Activity Tolerance: Patient tolerated treatment well;Patient limited by lethargy Patient left: in bed;with call bell/phone within reach;with bed alarm set Nurse Communication: Mobility status         Time: 2111-5520 PT Time Calculation (min): 32 min   Charges:   PT Evaluation $Initial PT Evaluation Tier I: 1 Procedure PT Treatments $Therapeutic Activity: 8-22 mins $Neuromuscular Re-education: 8-22 mins   PT G Codes:          Cearra Portnoy, Tessie Fass 11/21/2013, 4:12 PM 11/21/2013  Donnella Sham, PT [phone]  (pager)

## 2013-11-22 DIAGNOSIS — I609 Nontraumatic subarachnoid hemorrhage, unspecified: Secondary | ICD-10-CM

## 2013-11-22 DIAGNOSIS — I6350 Cerebral infarction due to unspecified occlusion or stenosis of unspecified cerebral artery: Secondary | ICD-10-CM

## 2013-11-22 DIAGNOSIS — I63239 Cerebral infarction due to unspecified occlusion or stenosis of unspecified carotid arteries: Secondary | ICD-10-CM

## 2013-11-22 DIAGNOSIS — G8110 Spastic hemiplegia affecting unspecified side: Secondary | ICD-10-CM

## 2013-11-22 LAB — TYPE AND SCREEN
ABO/RH(D): A NEG
Antibody Screen: NEGATIVE
Unit division: 0
Unit division: 0

## 2013-11-22 LAB — BASIC METABOLIC PANEL
Anion gap: 17 — ABNORMAL HIGH (ref 5–15)
BUN: 8 mg/dL (ref 6–23)
CHLORIDE: 94 meq/L — AB (ref 96–112)
CO2: 21 mEq/L (ref 19–32)
Calcium: 7.2 mg/dL — ABNORMAL LOW (ref 8.4–10.5)
Creatinine, Ser: 0.66 mg/dL (ref 0.50–1.10)
Glucose, Bld: 102 mg/dL — ABNORMAL HIGH (ref 70–99)
POTASSIUM: 3.2 meq/L — AB (ref 3.7–5.3)
SODIUM: 132 meq/L — AB (ref 137–147)

## 2013-11-22 LAB — CBC
HCT: 30.3 % — ABNORMAL LOW (ref 36.0–46.0)
Hemoglobin: 10.4 g/dL — ABNORMAL LOW (ref 12.0–15.0)
MCH: 34.1 pg — ABNORMAL HIGH (ref 26.0–34.0)
MCHC: 34.3 g/dL (ref 30.0–36.0)
MCV: 99.3 fL (ref 78.0–100.0)
Platelets: 259 10*3/uL (ref 150–400)
RBC: 3.05 MIL/uL — ABNORMAL LOW (ref 3.87–5.11)
RDW: 13.1 % (ref 11.5–15.5)
WBC: 8.6 10*3/uL (ref 4.0–10.5)

## 2013-11-22 LAB — GLUCOSE, CAPILLARY
GLUCOSE-CAPILLARY: 103 mg/dL — AB (ref 70–99)
GLUCOSE-CAPILLARY: 98 mg/dL (ref 70–99)
Glucose-Capillary: 100 mg/dL — ABNORMAL HIGH (ref 70–99)

## 2013-11-22 MED ORDER — ENOXAPARIN SODIUM 40 MG/0.4ML ~~LOC~~ SOLN
40.0000 mg | SUBCUTANEOUS | Status: DC
  Administered 2013-11-22 – 2013-12-06 (×15): 40 mg via SUBCUTANEOUS
  Filled 2013-11-22 (×17): qty 0.4

## 2013-11-22 MED ORDER — VITAL HIGH PROTEIN PO LIQD
1000.0000 mL | ORAL | Status: DC
  Filled 2013-11-22 (×2): qty 1000

## 2013-11-22 MED ORDER — ACETAMINOPHEN 160 MG/5ML PO SOLN
650.0000 mg | ORAL | Status: DC | PRN
  Administered 2013-11-29 – 2013-12-02 (×5): 650 mg
  Filled 2013-11-22 (×5): qty 20.3

## 2013-11-22 MED ORDER — OSMOLITE 1.2 CAL PO LIQD
1000.0000 mL | ORAL | Status: DC
  Administered 2013-11-22 – 2013-11-24 (×2): 1000 mL
  Filled 2013-11-22 (×7): qty 1000

## 2013-11-22 NOTE — Progress Notes (Signed)
Subjective: Patient reports (nonverbal)  Objective: Vital signs in last 24 hours: Temp:  [98.1 F (36.7 C)-99 F (37.2 C)] 98.9 F (37.2 C) (09/10 0400) Pulse Rate:  [50-78] 59 (09/10 0900) Resp:  [17-33] 29 (09/10 0900) BP: (128-185)/(52-145) 140/52 mmHg (09/10 0900) SpO2:  [94 %-100 %] 97 % (09/10 0900) Arterial Line BP: (168)/(66) 168/66 mmHg (09/09 1100)  Intake/Output from previous day: 09/09 0701 - 09/10 0700 In: 4337.5 [I.V.:3262.5; NG/GT:310; IV Piggyback:765] Out: 3975 [Urine:3975] Intake/Output this shift: Total I/O In: 399 [I.V.:304; NG/GT:40; IV Piggyback:55] Out: -   Opens eyes slowly to touch & loud voice. Follows command right side slowly. Somnolent. PEARL  Lab Results:  Recent Labs  11/21/13 0500 11/22/13 0352  WBC 7.8 8.6  HGB 9.9* 10.4*  HCT 28.6* 30.3*  PLT 215 259   BMET  Recent Labs  11/21/13 1530 11/22/13 0352  NA 131* 132*  K 4.0 3.2*  CL 94* 94*  CO2 22 21  GLUCOSE 88 102*  BUN 8 8  CREATININE 0.69 0.66  CALCIUM 7.4* 7.2*    Studies/Results: Ct Head Wo Contrast  11/21/2013   CLINICAL DATA:  Followup intracranial hemorrhage  EXAM: CT HEAD WITHOUT CONTRAST  TECHNIQUE: Contiguous axial images were obtained from the base of the skull through the vertex without intravenous contrast.  COMPARISON:  Prior CT from 11/20/2013  FINDINGS: Right frontal parenchymal hematoma is grossly similar in size as compared to prior study. Again, hemorrhages is seen within the subarachnoid space over the right frontal, parietal, and temporal lobes. Subdural hemorrhage tracks along the anterior falx. Subarachnoid hemorrhage within the right pre pontine cistern extending towards the right cerebellopontine angle also noted. Hemorrhage present within the left quadrigeminal plate cistern as well. Overall, amount of hemorrhage is not significantly changed. No definite new hemorrhage.  Vasogenic edema surrounding the right frontal parenchymal hematoma is overall similar  in volume but slightly more well defined, compatible with normal expected interval changes. Associated right-to-left midline shift is similar measuring 4-5 mm.  No definite new acute large vessel territory infarct.  Calvarium and scalp soft tissues remain within normal limits. No acute abnormality seen about the orbits. Paranasal sinuses and mastoid air cells are clear.  IMPRESSION: 1. No significant interval change in intraparenchymal, subarachnoid, and subdural hemorrhage as compared to 11/20/13. Associated vasogenic edema is similar with stable 4-5 mm of right-to-left midline shift. No hydrocephalus. 2. No new hemorrhage.   Electronically Signed   By: Jeannine Boga M.D.   On: 11/21/2013 04:54   Dg Chest Port 1 View  11/21/2013   CLINICAL DATA:  Atelectasis, followup  EXAM: PORTABLE CHEST - 1 VIEW  COMPARISON:  Portable chest x-ray of 9 8 and 11/30/2013  FINDINGS: Aeration of the left lung base has improved slightly with mild basilar atelectasis remaining. The tip of the endotracheal tube is approximately 4.3 cm above the carina. NG tube extends below the hemidiaphragm. Heart size is stable.  IMPRESSION: Improved aeration of the left lung base with mild basilar atelectasis remaining   Electronically Signed   By: Ivar Drape M.D.   On: 11/21/2013 08:09   Dg Abd Portable 1v  11/21/2013   CLINICAL DATA:  Feeding tube placement  EXAM: PORTABLE ABDOMEN - 1 VIEW  COMPARISON:  None.  FINDINGS: Feeding tube tip is in the stomach. Guidewire remains. Bowel gas pattern is normal.  IMPRESSION: Feeding tube tip in stomach.  Bowel gas pattern unremarkable.   Electronically Signed   By: Lowella Grip M.D.  On: 11/21/2013 17:46    Assessment/Plan:   LOS: 4 days  Per DrStern, will hold off on further sedation to assess status change/improvement.    Verdis Prime 11/22/2013, 10:20 AM

## 2013-11-22 NOTE — Progress Notes (Signed)
Physical Therapy Treatment Patient Details Name: Carla Hoover MRN: [id number] DOB: 09-May-1950 Today's Date: 11/22/2013    History of Present Illness 63 yo female fell and hit her head one day prior to admit.  Presented to [place] with Lt sided weakness and facial drop.  CT head showed ICH with SAH.  She required intubation prior to transfer to [facility]. Angiogram shows 7 mm Lt MCA Aneurysm, vasospasm of Rt MCA. Continued work-up underway.    PT Comments    Progressing slowly.  Limited by lethargy.  Pt able to follow simple commands and stay on task with cues when finally aroused enough.  Emphasis on sitting and standing balance and transfers.  Good CIR candidate.  Follow Up Recommendations  CIR     Equipment Recommendations       Recommendations for Other Services Rehab consult     Precautions / Restrictions Precautions Precautions: Fall Restrictions Weight Bearing Restrictions: No    Mobility  Bed Mobility Overal bed mobility: Needs Assistance Bed Mobility: Supine to Sit     Supine to sit: Mod assist;+2 for physical assistance     General bed mobility comments: Pt still lethargy and needs significant assist to get to EOB  Transfers Overall transfer level: Needs assistance Equipment used:  (chair back) Transfers: Sit to/from [facility] Sit to Stand: Mod assist;+2 physical assistance Stand pivot transfers: Mod assist;+2 safety/equipment       General transfer comment: cues for direction, assist to come forward and lift assist  Ambulation/Gait             General Gait Details: not able today   Stairs            Wheelchair Mobility    Modified Rankin (Stroke Patients Only) Modified Rankin (Stroke Patients Only) Pre-Morbid Rankin Score: No symptoms Modified Rankin: Severe disability     Balance Overall balance assessment: Needs assistance Sitting-balance support: Feet supported;Single extremity supported Sitting  balance-Leahy Scale: Fair Sitting balance - Comments: but poor when lethargic or fatigued     Standing balance-Leahy Scale: Poor Standing balance comment: stood x2 at EOB working on upright stance, w/shift opening BOS and maintaining use of chair back with R UE.  Stood total of 3-4 minute with 2 trials.                    Cognition Arousal/Alertness: Lethargic Behavior During Therapy: Flat affect Overall Cognitive Status: Impaired/Different from baseline Area of Impairment: Attention;Safety/judgement;Orientation;Problem solving   Current Attention Level: Sustained         Problem Solving: Slow processing;Decreased initiation      Exercises      General Comments General comments (skin integrity, edema, etc.): R gaze continues, ?Small amount of Ptosis R eye.  Pt maintains eyes open >50% of time with minor cuing.      Pertinent Vitals/Pain Pain Assessment:  (related HA, but not rated.)    Home Living                      Prior Function            PT Goals (current goals can now be found in the care plan section) Acute Rehab PT Goals Patient Stated Goal: pt unable to state PT Goal Formulation: Patient unable to participate in goal setting Time For Goal Achievement: 12/05/13 Potential to Achieve Goals: Fair Progress towards PT goals: Progressing toward goals    Frequency  Min 3X/week    PT  Plan Current plan remains appropriate    Co-evaluation             End of Session   Activity Tolerance: Patient tolerated treatment well;Patient limited by lethargy Patient left: in chair;with call bell/phone within reach     Time: 6967-8938 PT Time Calculation (min): 29 min  Charges:  $Therapeutic Activity: 23-37 mins                    G Codes:      Zlaty Alexa, Tessie Fass 11/22/2013, 1:07 PM 11/22/2013  Donnella Sham, PT [phone]  (pager)

## 2013-11-22 NOTE — Progress Notes (Signed)
STROKE TEAM PROGRESS NOTE   HISTORY  63 y.o. 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 She has surrounding cytotoxic edema and midline shift and is employed left MCA aneurysm as well.    SUBJECTIVE (INTERVAL HISTORY)  Patient has neurological ly been sleepier likely due to several doses of ativan given for alcohol withdrawal protocolr blood pressure has been adequately controlled. She is not complaining of headache this a.m.  Repeat CT head 11/21/13. shows stable appearance of the right insular hematoma with subarachnoid hemorrhage and vasogenic edema with 4.4 mm right-to-left midline shift  TCD done 11/21/13 again suboptimal with absent bitemporal windows  OBJECTIVE  Temp:  98-99 Pulse Rate:50-78  Resp: 14-33 BP: 111-185/55-145 Recent Labs  Lab  11/20/2013 1900  11/13/2013 0245  11/20/13 0550  11/21/13 0500   NA  132*  128*  131*  131*   K  3.6*  3.7  3.2*  2.7*   CL  93*  92*  96  95*   CO2  23  20  22  22    GLUCOSE  142*  148*  109*  124*   BUN  10  11  10  9    CREATININE  0.99  0.98  0.82  0.72   CALCIUM  8.5  8.0*  7.2*  7.0*   MG  1.0*  --  --  --     Recent Labs  Lab  11/29/2013 1900  11/20/13 0550   AST  74*  49*   ALT  37*  24   ALKPHOS  122*  86   BILITOT  1.2  0.9   PROT  6.8  5.8*   ALBUMIN  3.3*  2.6*     Recent Labs  Lab  11/20/2013 2345  11/23/2013 0245  11/20/13 0550  11/21/13 0500   WBC  --  7.1  8.0  7.8   NEUTROABS  --  --  6.2  --   HGB  --  11.1*  9.6*  9.9*   HCT  --  32.0*  28.0*  28.6*   MCV  --  100.3*  99.6  99.3   PLT  205  186  248  215     Recent Labs   12/11/2013 1900   LABPROT  12.5   INR  0.93      Component  Value  Date/Time    CHOL  118  05/18/2012 0512    TRIG  90  12/07/2013 1900    HDL  66  05/18/2012 0512    CHOLHDL  1.8  05/18/2012 0512    VLDL  14  05/18/2012 0512    LDLCALC  38  05/18/2012 0512    Ct Angio Head W/cm &/or Wo Cm  12/07/2013 1. Unchanged appearance of extensive, predominantly right-sided subarachnoid hemorrhage as well as 2.7 cm hematoma in the region of the right insula. Moderate surrounding edema with 5 mm of unchanged midline shift. 2. No right MCA aneurysm is identified, however there is severe attenuation of the right MCA and its branch vessels which may reflect local mass effect and vasospasm secondary to surrounding hemorrhage, and this could obscure an underlying right MCA aneurysm. Narrowing of the ACAs and right ICA terminus also may reflect vasospasm. Catheter angiography may be helpful for further evaluation. 3. 7 mm left MCA bifurcation  aneurysm. 4. Occluded right vertebral artery.  Dg Chest 1 View  11/21/2013 Improved aeration of the left lung base with mild basilar atelectasis remaining  11/20/2013 Slight increase in left basilar opacity most consistent with atelectasis.  11/22/2013 No significant change in mild opacity in the lateral left lung base.  12/05/2013 Endotracheal tube and nasogastric tube in appropriate position. Stable mild opacity in lateral left lung base.  Ct Head Wo Contrast  11/21/2013 1. No significant interval change in intraparenchymal, subarachnoid, and subdural hemorrhage as compared to 11/20/13. Associated vasogenic edema is similar with stable 4-5 mm of right-to-left midline shift. No hydrocephalus. 2. No new hemorrhage.  11/20/2013 1. No change in the intracranial hemorrhage since the most recent prior study. Right frontal parenchymal hematoma is stable in size as is the degree of mass effect. Subarachnoid hemorrhage and areas of subdural hemorrhage are stable. No new intracranial hemorrhage. 2. Mass effect and midline shift ir stable. 3. No hydrocephalus.  11/26/2013 Similar extensive right greater than left subarachnoid blood, with a trace possible posterior falcine subdural hematoma. Evolving 2.7 x  2.4 cm right insular hematoma with similar 5 mm right to left midline shift. No hydrocephalus.  TCD  11/25/2013 Limited study due to poor windows and neck strap limiting evaluation. Low normal mean flow velocities in identified vessels of anterior circulation. Globally elevated pulsatility indices suggest increase intracranial presure or diffuse intracranial atherosclerosis.  PHYSICAL EXAM  Filed Vitals:    11/21/13 1300   BP:  164/78   Pulse:  62   Temp:    Resp:  36   [name] intubated. Afebrile. Head is nontraumatic. Neck is supple without bruit. l. Cardiac exam no murmur or gallop. Lungs are clear to auscultation. Distal pulses are well felt.  Neurologic Examination:  Lethargic but responds to sternal rub follows simple midline and one-step commands. Pupils are equal and reactive. Fundi were not visualized. Vision acuity cannot be reliably tested. Right gaze preference but able to look to the left past midline but not all the way. Blinks to threat on the right but not as well on the left. Left lower facial weakness. Tongue midline. Able to move all 4 extremities but right side more than left. Has significant weakness in LUE  with mild weakness of left hip flexors and 3/5 LLE strength. Deep tendon reflexes are 2+ symmetric. Right plantar is downgoing left is equivocal. Withdraws to painful stimuli not 4 extremities. Coordination and gait cannot be tested  ASSESSMENT/PLAN  [name] is a 63 y.o. female with history of CHF, depression, elevated cholesterol, COPD, MI, and CAD on aspirin and plavix s/p fall where she hit her head. Presenting to [facility] 11/25/2013 with left sided weakness, facial droop and headache. Intubated there and transferred to [facility]. Imaging confirms a right SAH, R IPH, no acute infarct.  Hemorrhage: R insular IPH with R perisylvian SAH & R SDH likely from R MCA aneurysm not seen on angio due to severe vasospasm with vasogenic edema and mass  effect  Respiratory failure secondary to hemorhhage, intubated at [facility],  aspirin 81 mg orally every day and clopidogrel 75 mg orally every day prior to admission,  SCDs for VTE prophylaxis  NPO  Bedrest  Resultant Left hemiplegia Therapy needs: Pending  Ongoing aggressive risk factor management Disposition: pending Hypertension  Ischemic Cardiomyopathy  Home meds: Lasix, lopressor, imdur. BP 98-178/53-90 past 24h (11/21/2013 @ 11:24 AM) SBP goal below 180  Unstable Hyperlipidemia  Home meds: pravachol.  Other Stroke Risk Factors  Advanced age  Cigarette smoker, advised to stop smoking  ETOH use, etiology of fall. Coronary artery disease - MI, angioplasty 2013 Ischemic cardiomyopathy Other Active Problems  Other Pertinent History  AAA, infrarenal  R hepatic lobe lesion Hospital day # 3  This patient is critically ill and at significant risk of neurological worsening, death and care requires constant monitoring of vital signs, hemodynamics,respiratory and cardiac monitoring,review of multiple databases, neurological assessment, discussion with family, other specialists and medical decision making of high complexity.I have made any additions or clarifications directly to the above note.  I spent 30 minutes of neurocritical care time in the care of this patient.  Recommend Dc ativan. Change SCDs to lovenox for dvt prophylaxis.Agree with increasing hydration and liberalizing blood pressure goal to decrease vasospasm and continued nimodipine and TCD. Unfortunately she has poor windows which limits this evaluation. Extubate per CCM  D/W Dr Halford Chessman Antony Contras, MD  To contact Stroke Continuity provider, please refer to [email or web address]  After hours, contact General Neurology

## 2013-11-22 NOTE — Progress Notes (Signed)
Dr. Halford Chessman paged regarding rate of IV fluids. Per Dr. Vertell Limber IV fluids should be at 150 cc/hr. Also, patient has intermittent times of irregular heart rhythm of A Fib with controlled rate. No new orders. Will continue to monitor.  Carla Hoover

## 2013-11-22 NOTE — Progress Notes (Signed)
Subjective: Patient is s/p Right MCA IA verapamil for vasospasm. Per RN patient not moving left side for her.  Objective: Physical Exam: BP 140/52  Pulse 59  Temp(Src) 98.9 F (37.2 C) (Oral)  Resp 29  Ht 5\' 4"  (1.626 m)  Wt 149 lb 7.6 oz (67.8 kg)  BMI 25.64 kg/m2  SpO2 97%  General: Will open right eye to verbal stimulus/touch, NAD Abd: Soft, NT, ND Ext: RCFA dressing C/D/I, soft, no signs of hematoma, DP intact   Labs: CBC  Recent Labs  11/21/13 0500 11/22/13 0352  WBC 7.8 8.6  HGB 9.9* 10.4*  HCT 28.6* 30.3*  PLT 215 259   BMET  Recent Labs  11/21/13 1530 11/22/13 0352  NA 131* 132*  K 4.0 3.2*  CL 94* 94*  CO2 22 21  GLUCOSE 88 102*  BUN 8 8  CREATININE 0.69 0.66  CALCIUM 7.4* 7.2*   LFT  Recent Labs  11/20/13 0550  PROT 5.8*  ALBUMIN 2.6*  AST 49*  ALT 24  ALKPHOS 86  BILITOT 0.9   PT/INR No results found for this basename: LABPROT, INR,  in the last 72 hours   Studies/Results: Ct Head Wo Contrast  11/21/2013   CLINICAL DATA:  Followup intracranial hemorrhage  EXAM: CT HEAD WITHOUT CONTRAST  TECHNIQUE: Contiguous axial images were obtained from the base of the skull through the vertex without intravenous contrast.  COMPARISON:  Prior CT from 11/20/2013  FINDINGS: Right frontal parenchymal hematoma is grossly similar in size as compared to prior study. Again, hemorrhages is seen within the subarachnoid space over the right frontal, parietal, and temporal lobes. Subdural hemorrhage tracks along the anterior falx. Subarachnoid hemorrhage within the right pre pontine cistern extending towards the right cerebellopontine angle also noted. Hemorrhage present within the left quadrigeminal plate cistern as well. Overall, amount of hemorrhage is not significantly changed. No definite new hemorrhage.  Vasogenic edema surrounding the right frontal parenchymal hematoma is overall similar in volume but slightly more well defined, compatible with normal expected  interval changes. Associated right-to-left midline shift is similar measuring 4-5 mm.  No definite new acute large vessel territory infarct.  Calvarium and scalp soft tissues remain within normal limits. No acute abnormality seen about the orbits. Paranasal sinuses and mastoid air cells are clear.  IMPRESSION: 1. No significant interval change in intraparenchymal, subarachnoid, and subdural hemorrhage as compared to 11/20/13. Associated vasogenic edema is similar with stable 4-5 mm of right-to-left midline shift. No hydrocephalus. 2. No new hemorrhage.   Electronically Signed   By: Jeannine Boga M.D.   On: 11/21/2013 04:54   Dg Chest Port 1 View  11/21/2013   CLINICAL DATA:  Atelectasis, followup  EXAM: PORTABLE CHEST - 1 VIEW  COMPARISON:  Portable chest x-ray of 9 8 and 12/05/2013  FINDINGS: Aeration of the left lung base has improved slightly with mild basilar atelectasis remaining. The tip of the endotracheal tube is approximately 4.3 cm above the carina. NG tube extends below the hemidiaphragm. Heart size is stable.  IMPRESSION: Improved aeration of the left lung base with mild basilar atelectasis remaining   Electronically Signed   By: Ivar Drape M.D.   On: 11/21/2013 08:09   Dg Abd Portable 1v  11/21/2013   CLINICAL DATA:  Feeding tube placement  EXAM: PORTABLE ABDOMEN - 1 VIEW  COMPARISON:  None.  FINDINGS: Feeding tube tip is in the stomach. Guidewire remains. Bowel gas pattern is normal.  IMPRESSION: Feeding tube tip in stomach.  Bowel gas pattern unremarkable.   Electronically Signed   By: Lowella Grip M.D.   On: 11/21/2013 17:46   Assessment/Plan: Left MCA aneurysm  Right MCA vasospasm s/p IA verapamil 9/7 Extubated 9/9 on plavix and aspirin Not moving left side today, Rt groin site stable.  D/w Dr. Estanislado Pandy.    LOS: 4 days    Carla Hoover 11/22/2013 10:46 AM

## 2013-11-22 NOTE — Progress Notes (Signed)
PULMONARY / CRITICAL CARE MEDICINE   Name: Carla Hoover MRN: [id number] DOB: Sep 07, 1950    ADMISSION DATE:  12/07/2013 CONSULTATION DATE:  11/18/2013  REFERRING MD :  Erline Levine  CHIEF COMPLAINT:  Weakness  INITIAL PRESENTATION:  63 yo female fell and hit her head one day prior to admit.  Presented to [place] with Lt sided weakness and facial drop.  CT head showed ICH with SAH.  She required intubation prior to transfer to [facility].  PCCM consulted for vent/medical management.  STUDIES:  9/06 CT head ([place]) 9/07 Cerebral arteriogram >> 7 mm Lt MCA Aneurysm, vasospasm of Rt MCA 9/09 CT head >> no change in ICH, SAH, SDH  SIGNIFICANT EVENTS: 9/06 Transfer from [place] to [facility]  SUBJECTIVE:  Received ativan x two doses since last night.  VITAL SIGNS: Temp:  [98.1 F (36.7 C)-99 F (37.2 C)] 98.9 F (37.2 C) (09/10 0400) Pulse Rate:  [50-78] 60 (09/10 0700) Resp:  [14-33] 28 (09/10 0700) BP: (128-185)/(53-145) 136/61 mmHg (09/10 0700) SpO2:  [94 %-100 %] 100 % (09/10 0700) Arterial Line BP: (149-168)/(56-66) 168/66 mmHg (09/09 1100) FiO2 (%):  [30 %] 30 % (09/09 0916) INTAKE / OUTPUT:  Intake/Output Summary (Last 24 hours) at 11/22/13 0838 Last data filed at 11/22/13 0644  Gross per 24 hour  Intake 3807.5 ml  Output   3975 ml  Net -167.5 ml    PHYSICAL EXAMINATION: General: no distress Neuro: somnolent, opens eyes with stimulation, moving Rt side HEENT: Panda tube in place Cardiovascular: regular Lungs: scattered rhonchi Abdomen:  Soft, non tender Musculoskeletal: no edema Skin:  No rashes   LABS:  CBC  Recent Labs Lab 11/20/13 0550 11/21/13 0500 11/22/13 0352  WBC 8.0 7.8 8.6  HGB 9.6* 9.9* 10.4*  HCT 28.0* 28.6* 30.3*  PLT 248 215 259   Coag's  Recent Labs Lab 12/02/2013 1900  INR 0.93   BMET  Recent Labs Lab 11/21/13 0500 11/21/13 1530 11/22/13 0352  NA 131* 131* 132*  K 2.7* 4.0 3.2*  CL 95* 94* 94*  CO2 22 22 21   BUN 9  8 8   CREATININE 0.72 0.69 0.66  GLUCOSE 124* 88 102*   Electrolytes  Recent Labs Lab 11/16/2013 1900  11/21/13 0500 11/21/13 1530 11/22/13 0352  CALCIUM 8.5  < > 7.0* 7.4* 7.2*  MG 1.0*  --   --  1.0*  --   < > = values in this interval not displayed.  ABG  Recent Labs Lab 12/04/2013 2014  PHART 7.425  PCO2ART 34.2*  PO2ART 216.0*   Liver Enzymes  Recent Labs Lab 12/09/2013 1900 11/20/13 0550  AST 74* 49*  ALT 37* 24  ALKPHOS 122* 86  BILITOT 1.2 0.9  ALBUMIN 3.3* 2.6*   Imaging Ct Head Wo Contrast  11/21/2013   CLINICAL DATA:  Followup intracranial hemorrhage  EXAM: CT HEAD WITHOUT CONTRAST  TECHNIQUE: Contiguous axial images were obtained from the base of the skull through the vertex without intravenous contrast.  COMPARISON:  Prior CT from 11/20/2013  FINDINGS: Right frontal parenchymal hematoma is grossly similar in size as compared to prior study. Again, hemorrhages is seen within the subarachnoid space over the right frontal, parietal, and temporal lobes. Subdural hemorrhage tracks along the anterior falx. Subarachnoid hemorrhage within the right pre pontine cistern extending towards the right cerebellopontine angle also noted. Hemorrhage present within the left quadrigeminal plate cistern as well. Overall, amount of hemorrhage is not significantly changed. No definite new hemorrhage.  Vasogenic edema  surrounding the right frontal parenchymal hematoma is overall similar in volume but slightly more well defined, compatible with normal expected interval changes. Associated right-to-left midline shift is similar measuring 4-5 mm.  No definite new acute large vessel territory infarct.  Calvarium and scalp soft tissues remain within normal limits. No acute abnormality seen about the orbits. Paranasal sinuses and mastoid air cells are clear.  IMPRESSION: 1. No significant interval change in intraparenchymal, subarachnoid, and subdural hemorrhage as compared to 11/20/13. Associated  vasogenic edema is similar with stable 4-5 mm of right-to-left midline shift. No hydrocephalus. 2. No new hemorrhage.   Electronically Signed   By: Jeannine Boga M.D.   On: 11/21/2013 04:54   Dg Chest Port 1 View  11/21/2013   CLINICAL DATA:  Atelectasis, followup  EXAM: PORTABLE CHEST - 1 VIEW  COMPARISON:  Portable chest x-ray of 9 8 and 12/09/2013  FINDINGS: Aeration of the left lung base has improved slightly with mild basilar atelectasis remaining. The tip of the endotracheal tube is approximately 4.3 cm above the carina. NG tube extends below the hemidiaphragm. Heart size is stable.  IMPRESSION: Improved aeration of the left lung base with mild basilar atelectasis remaining   Electronically Signed   By: Ivar Drape M.D.   On: 11/21/2013 08:09   Dg Abd Portable 1v  11/21/2013   CLINICAL DATA:  Feeding tube placement  EXAM: PORTABLE ABDOMEN - 1 VIEW  COMPARISON:  None.  FINDINGS: Feeding tube tip is in the stomach. Guidewire remains. Bowel gas pattern is normal.  IMPRESSION: Feeding tube tip in stomach.  Bowel gas pattern unremarkable.   Electronically Signed   By: Lowella Grip M.D.   On: 11/21/2013 17:46     ASSESSMENT / PLAN: NEUROLOGIC A:   ICH with SAH. Hx of anxiety, depression, insomnia. Hx of ETOH. P:   Valproate for seizure prevention per neurology Nimotop per neurosurgery F/u TCD's Continue prozac Prn ativan for CIWA > 8 Thiamine, folic acid  PULMONARY OETT 9/06 >> 9/09 A: Acute respiratory failure in setting of ICH. Atelectasis. Hx of COPD, tobacco abuse. P:   Oxygen to keep SpO2 > 92% F/u CXR intermittently PRN albuterol Hold outpt advair for now  CARDIOVASCULAR Lt radial aline 9/07 >> 9/09 A:  Hx of CAD, hyperlipidemia, HTN, chronic systolic heart failure, AAA. P:  Monitor hemodynamics Hold ASA, plavix in setting of ICH until okay with neurology/neurosurgery Continue lopressor, zocor (uses pravachol as outpt)  Hold imdur, lasix for  now  RENAL A:   CKD. Hyponatremia >> improved. Hypokalemia. P:   Monitor renal fx, urine outpt, electrolytes  GASTROINTESTINAL A:   Nutrition. Dysphagia. Hx of IBS. P:   NPO Protonix for SUP F/u with speech therapy Panda tube for meds, nutrition   HEMATOLOGIC A:   Anemia of critical illness. P:  F/u CBC  INFECTIOUS A:   No evidence for infection. P:   Monitor clinically  ENDOCRINE A:   No acute issues.   P:   Monitor blood sugar on BMET  TODAY'S SUMMARY: Mental status still tenuous.    CC time 35 minutes.  Chesley Mires, MD [facility] Pulmonary/Critical Care 11/22/2013, 8:38 AM Pager:  [phone] After 3pm call: [phone]

## 2013-11-22 NOTE — Progress Notes (Signed)
Speech Language Pathology Treatment: Dysphagia;Cognitive-Linquistic  Patient Details Name: MALENA TIMPONE MRN: [id number] DOB: 03-Dec-1950 Today's Date: 11/22/2013 Time: 1206-1224 SLP Time Calculation (min): 18 min  Assessment / Plan / Recommendation Clinical Impression  Pt remains lethargic today, but is OOB in chair for diagnostic swallowing therapy. SLP provided Total A for oral care via suctioning, as well as suctioning for oral secretions that were pooling in mouth upon arrival. Pt remains unable to cough despite Max cues from therapist. Pt had adequate oral acceptance of PO, but unfortunately makes no attempts to manipulate, transit, or swallow boluses, which are ultimately anteriorly spilled or suctioned by SLP. Pt remains at a very high aspiration risk with decreased ability to manage her secretions.   Pt verbally demonstrated orientation to location and situation, with Max cues from SLP for increased breath support for speech. She required Max cues provided by SLP for sustained attention to attempts at exercises to increase lingual ROM. Pt demonstrates very minimal movements of her oral structures both for speech and swallowing functions. Will continue to follow.   HPI HPI: 63 y.o. 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 She has surrounding cytotoxic edema and midline shift and is employed left MCA aneurysm as well.    Pertinent Vitals Pain Assessment:  (nodded yes for pain in head, RN aware)  SLP Plan  Continue with current plan of care    Recommendations Diet recommendations: NPO Medication Administration: Via alternative means              Oral Care Recommendations: Oral care Q4 per protocol Follow up Recommendations: Inpatient Rehab;24 hour supervision/assistance Plan: Continue with  current plan of care    GO      Germain Osgood, M.A. CCC-SLP [phone]  Germain Osgood 11/22/2013, 2:02 PM

## 2013-11-22 NOTE — Progress Notes (Signed)
UR completed.  Gunther Zawadzki, RN BSN MHA CCM Trauma/Neuro ICU Case Manager [phone]  

## 2013-11-22 NOTE — Consult Note (Signed)
Physical Medicine and Rehabilitation Consult Reason for Consult: [place] with [facility] Referring Physician: Dr. Vertell Limber   HPI: Carla Hoover is a 63 y.o. right-handed female with history of hypertension, ischemic cardiomyopathy and tobacco/ alcohol abuse. By report patient was independent living alone prior to admission. Admitted 11/25/2013 after a fall striking her head. She was brought to [facility] for left-sided weakness and facial droop. Head CT at that time showed right sylvian SAH and blood in sylvian fissure and layering in region of right MCA. She she was intubated and transferred to [facility] for further care. CT angiogram head showed unchanged appearance of extensive, predominantly right sided subarachnoid hemorrhage as well as 2. 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 Underwent cerebral arteriogram 11/16/2013 per interventional radiology with no definite aneurysm identified. Patient remained on nimotop for blood pressure control. Patient was slowly extubated. Subcutaneous Lovenox added for DVT prophylaxis 11/22/2013. Patient is currently n.p.o. Physical therapy evaluation completed 11/21/2013 with recommendations for physical medicine rehabilitation consult.  Patient is poorly responsive. Sitting in chair. According to RN, patient received Ativan about 1 hour ago. Review of Systems  Unable to perform ROS: mental acuity   Past Medical History  Diagnosis Date  . Hypertension     a. Hypotension noted 05/2012.  Marland Kitchen Anxiety   . Depression   . Arthritis   . Irritable bowel syndrome (IBS)   . Diverticulitis   . Insomnia   . Eczema   . Gout   . GI bleed     Possible mild GI bleed 04/2011, hemoccult negative at [facility] and no colonoscopy/EGD felt necessary by GI at that time  . Ischemic cardiomyopathy    IMPROVED. a. EF 40-45% by echo at [facility] 04/2011. b. Improved to 55% by echo 05/2012.  Marland Kitchen CAD (coronary artery disease)     a. s/p two overlapping BMS to LAD 05/12/11, Occluded mid LCX with collaterals. b. NSTEMI 05/2012: PTCA/DES to mid Cx and PTCA/DES to prox-mid LAD 05/18/12.  Marland Kitchen Hyperlipidemia   . AAA (abdominal aortic aneurysm) 05/19/2012    a. Infrarenal AAA by CT at [facility] 3.4cm 05/2012.  . Tobacco abuse   . Hepatic lesion     a. Right hepatic lobe density of uncertain significance by CT at [facility] 05/2012 - needs f/u PCP.  Marland Kitchen Acute renal insufficiency     05/2012 - Cr 1.7. Improved.  . Tobacco use disorder 05/25/2012  . GERD (gastroesophageal reflux disease) 01/18/2013   Past Surgical History  Procedure Laterality Date  . Abdominal hysterectomy    . Cardiac catheterization    . Coronary angioplasty  05/2011    2 BMS to mid LAD  . Colonoscopy      about 10 years, Dr. Hinton Lovely  . Colonoscopy N/A 02/15/2013    Procedure: COLONOSCOPY;  Surgeon: Daneil Dolin, MD;  [facility];  Service: Endoscopy;  Laterality: N/A;  2:15-moved to [place] notified pt  . Esophagogastroduodenoscopy (egd) with esophageal dilation N/A 02/15/2013    Procedure: ESOPHAGOGASTRODUODENOSCOPY (EGD) WITH ESOPHAGEAL DILATION;  Surgeon: Daneil Dolin, MD;  [facility];  Service: Endoscopy;  Laterality: N/A;  . Radiology with anesthesia N/A 12/11/2013    Procedure: RADIOLOGY WITH ANESTHESIA;  Surgeon: Rob Hickman, MD;  Location: [place];  Service: Radiology;  Laterality: N/A;  Family History  Problem Relation Age of Onset  . Colon cancer Neg Hx   . Lung cancer Brother     recurrent X 4   Social History:  reports that she has been smoking Cigarettes.  She has been smoking about 0.00 packs per day for the past 50 years. She has never used smokeless tobacco. She reports that she drinks about 1.2 ounces of alcohol per week. She reports that she does not use illicit  drugs. Allergies:  Allergies  Allergen Reactions  . Penicillins Swelling and Other (See Comments)    Per patient report, she has facial and tongue swellling  . Percocet [Oxycodone-Acetaminophen] Other (See Comments)    Hallucinations  . Percodan [Oxycodone-Aspirin] Other (See Comments)    Hallucinations   Medications Prior to Admission  Medication Sig Dispense Refill  . amLODipine (NORVASC) 5 MG tablet Take 5 mg by mouth daily.      Marland Kitchen aspirin 81 MG tablet Take 81 mg by mouth daily.      . clopidogrel (PLAVIX) 75 MG tablet Take 1 tablet (75 mg total) by mouth daily.  30 tablet  11  . FLUoxetine (PROZAC) 20 MG capsule Take 20 mg by mouth daily.      . Fluticasone-Salmeterol (ADVAIR DISKUS) 100-50 MCG/DOSE AEPB Inhale 1 puff into the lungs every 12 (twelve) hours.       . furosemide (LASIX) 20 MG tablet Take 20 mg by mouth daily.       . isosorbide mononitrate (IMDUR) 60 MG 24 hr tablet Take 1 tablet (60 mg total) by mouth daily.  30 tablet  6  . LORazepam (ATIVAN) 1 MG tablet Take 1 tablet (1 mg total) by mouth every 6 (six) hours as needed for anxiety.  30 tablet  0  . metoprolol (LOPRESSOR) 50 MG tablet Take 50 mg by mouth 2 (two) times daily.      . nitroGLYCERIN (NITROSTAT) 0.4 MG SL tablet Place 0.4 mg under the tongue every 5 (five) minutes as needed for chest pain. For chest pain      . pantoprazole (PROTONIX) 40 MG tablet Take 1 tablet (40 mg total) by mouth daily before breakfast.  30 tablet  11  . polyethylene glycol (MIRALAX / GLYCOLAX) packet Take 17 g by mouth daily.       . pravastatin (PRAVACHOL) 80 MG tablet Take 80 mg by mouth daily.        Home: Home Living Family/patient expects to be discharged to:: Private residence Living Arrangements: Alone  Functional History: Prior Function Level of Independence: Independent Functional Status:  Mobility: Bed Mobility Overal bed mobility: Needs Assistance Bed Mobility: Supine to Sit;Sit to Supine Supine to sit: Max  assist;+2 for physical assistance Sit to supine: Max assist;+2 for physical assistance General bed mobility comments: Due to lethargy, pt needed significant assist to EOB and the same to return due to fatigue. Transfers Overall transfer level: Needs assistance Transfers: Sit to/from Stand Sit to Stand: Mod assist;+2 physical assistance General transfer comment: times 2, with cues for direction and to help stay on task Ambulation/Gait General Gait Details: not able today    ADL:    Cognition: Cognition Overall Cognitive Status: Impaired/Different from baseline Arousal/Alertness: Lethargic (suspect impacted by medications) Orientation Level: Oriented X4 (can nod appropriately, lethargic currently) Attention: Focused;Sustained Focused Attention: Impaired Focused Attention Impairment: Verbal basic;Functional basic Sustained Attention: Impaired Sustained Attention Impairment: Verbal basic;Functional basic Problem Solving: Impaired Problem Solving Impairment: Functional basic Cognition Arousal/Alertness: Lethargic (8-10 minutes to get  alert--during this time sats dropping  ) Behavior During Therapy: Flat affect Overall Cognitive Status: Impaired/Different from baseline Area of Impairment: Attention;Safety/judgement;Orientation;Problem solving Current Attention Level: Sustained;Focused  Blood pressure 140/52, pulse 59, temperature 98.9 F (37.2 C), temperature source Oral, resp. rate 29, height 5\' 4"  (1.626 m), weight 67.8 kg (149 lb 7.6 oz), SpO2 97.00%. Physical Exam  Constitutional:  Bruising to forehead and face  HENT:  Nasogastric tube in place  Eyes:  Pupils sluggish to light  Neck: Neck supple. No thyromegaly present.  Cardiovascular: Normal rate and regular rhythm.   Respiratory:  Decreased breath sounds at the bases  GI: Soft. Bowel sounds are normal. She exhibits no distension.  Neurological: GCS eye subscore is 3. GCS verbal subscore is 1. GCS motor subscore is 6.   Patient is lethargic but would open her eyes on command. She followed simple verbal commands and was easily distracted. She was nonverbal during exam   opens eyes to command, movements right side slowly to command Motor strength is 0/5 in the left upper and left lower limb. No withdrawal to pinch on the left side Wince but no withdrawal on the right side Will follow objects to the left but does not sustain gaze to the left., Right gaze preference. No sign of nystagmus Unable to assess visual fields secondary to mental status Results for orders placed during the hospital encounter of 11/23/2013 (from the past 24 hour(s))  BASIC METABOLIC PANEL     Status: Abnormal   Collection Time    11/21/13  3:30 PM      Result Value Ref Range   Sodium 131 (*) 137 - 147 mEq/L   Potassium 4.0  3.7 - 5.3 mEq/L   Chloride 94 (*) 96 - 112 mEq/L   CO2 22  19 - 32 mEq/L   Glucose, Bld 88  70 - 99 mg/dL   BUN 8  6 - 23 mg/dL   Creatinine, Ser 0.69  0.50 - 1.10 mg/dL   Calcium 7.4 (*) 8.4 - 10.5 mg/dL   GFR calc non Af Amer 89 (*) >90 mL/min   GFR calc Af Amer >90  >90 mL/min   Anion gap 15  5 - 15  MAGNESIUM     Status: Abnormal   Collection Time    11/21/13  3:30 PM      Result Value Ref Range   Magnesium 1.0 (*) 1.5 - 2.5 mg/dL  CBC     Status: Abnormal   Collection Time    11/22/13  3:52 AM      Result Value Ref Range   WBC 8.6  4.0 - 10.5 K/uL   RBC 3.05 (*) 3.87 - 5.11 MIL/uL   Hemoglobin 10.4 (*) 12.0 - 15.0 g/dL   HCT 30.3 (*) 36.0 - 46.0 %   MCV 99.3  78.0 - 100.0 fL   MCH 34.1 (*) 26.0 - 34.0 pg   MCHC 34.3  30.0 - 36.0 g/dL   RDW 13.1  11.5 - 15.5 %   Platelets 259  150 - 400 K/uL  BASIC METABOLIC PANEL     Status: Abnormal   Collection Time    11/22/13  3:52 AM      Result Value Ref Range   Sodium 132 (*) 137 - 147 mEq/L   Potassium 3.2 (*) 3.7 - 5.3 mEq/L   Chloride 94 (*) 96 - 112 mEq/L   CO2 21  19 - 32 mEq/L   Glucose, Bld 102 (*) 70 - 99  mg/dL   BUN 8  6 - 23 mg/dL    Creatinine, Ser 0.66  0.50 - 1.10 mg/dL   Calcium 7.2 (*) 8.4 - 10.5 mg/dL   GFR calc non Af Amer >90  >90 mL/min   GFR calc Af Amer >90  >90 mL/min   Anion gap 17 (*) 5 - 15   Ct Head Wo Contrast  11/21/2013   CLINICAL DATA:  Followup intracranial hemorrhage  EXAM: CT HEAD WITHOUT CONTRAST  TECHNIQUE: Contiguous axial images were obtained from the base of the skull through the vertex without intravenous contrast.  COMPARISON:  Prior CT from 11/20/2013  FINDINGS: Right frontal parenchymal hematoma is grossly similar in size as compared to prior study. Again, hemorrhages is seen within the subarachnoid space over the right frontal, parietal, and temporal lobes. Subdural hemorrhage tracks along the anterior falx. Subarachnoid hemorrhage within the right pre pontine cistern extending towards the right cerebellopontine angle also noted. Hemorrhage present within the left quadrigeminal plate cistern as well. Overall, amount of hemorrhage is not significantly changed. No definite new hemorrhage.  Vasogenic edema surrounding the right frontal parenchymal hematoma is overall similar in volume but slightly more well defined, compatible with normal expected interval changes. Associated right-to-left midline shift is similar measuring 4-5 mm.  No definite new acute large vessel territory infarct.  Calvarium and scalp soft tissues remain within normal limits. No acute abnormality seen about the orbits. Paranasal sinuses and mastoid air cells are clear.  IMPRESSION: 1. No significant interval change in intraparenchymal, subarachnoid, and subdural hemorrhage as compared to 11/20/13. Associated vasogenic edema is similar with stable 4-5 mm of right-to-left midline shift. No hydrocephalus. 2. No new hemorrhage.   Electronically Signed   By: Jeannine Boga M.D.   On: 11/21/2013 04:54   Dg Chest Port 1 View  11/21/2013   CLINICAL DATA:  Atelectasis, followup  EXAM: PORTABLE CHEST - 1 VIEW  COMPARISON:  Portable chest  x-ray of 9 8 and 12/05/2013  FINDINGS: Aeration of the left lung base has improved slightly with mild basilar atelectasis remaining. The tip of the endotracheal tube is approximately 4.3 cm above the carina. NG tube extends below the hemidiaphragm. Heart size is stable.  IMPRESSION: Improved aeration of the left lung base with mild basilar atelectasis remaining   Electronically Signed   By: Ivar Drape M.D.   On: 11/21/2013 08:09   Dg Abd Portable 1v  11/21/2013   CLINICAL DATA:  Feeding tube placement  EXAM: PORTABLE ABDOMEN - 1 VIEW  COMPARISON:  None.  FINDINGS: Feeding tube tip is in the stomach. Guidewire remains. Bowel gas pattern is normal.  IMPRESSION: Feeding tube tip in stomach.  Bowel gas pattern unremarkable.   Electronically Signed   By: Lowella Grip M.D.   On: 11/21/2013 17:46    Assessment/Plan: Diagnosis: Right traumatic subarachnoid hemorrhage after fall with left hemiparesis and severe cognitive deficits 1. Does the need for close, 24 hr/day medical supervision in concert with the patient's rehab needs make it unreasonable for this patient to be served in a less intensive setting? Potentially 2. Co-Morbidities requiring supervision/potential complications: Ischemic cardiomyopathy, ethanol abuse, hypertension 3. Due to bladder management, bowel management, safety, skin/wound care, disease management, medication administration, pain management and patient education, does the patient require 24 hr/day rehab nursing? Potentially 4. Does the patient require coordinated care of a physician, rehab nurse, PT (1-2 hrs/day, 5 days/week), OT (1-2 hrs/day, 5 days/week) and SLP (0.51 hrs/day, 5 days/week) to address physical and  functional deficits in the context of the above medical diagnosis(es)? Yes Addressing deficits in the following areas: balance, endurance, locomotion, strength, transferring, bowel/bladder control, bathing, dressing, feeding, grooming, toileting, cognition, speech,  language, swallowing and psychosocial support 5. Can the patient actively participate in an intensive therapy program of at least 3 hrs of therapy per day at least 5 days per week? No 6. The potential for patient to make measurable gains while on inpatient rehab is fair 7. Anticipated functional outcomes upon discharge from inpatient rehab are min assist  with PT, min assist with OT, min assist with SLP. 8. Estimated rehab length of stay to reach the above functional goals is: 3-4 weeks 9. Does the patient have adequate social supports to accommodate these discharge functional goals? Potentially 10. Anticipated D/C setting: Home vs. SNF with reduced burden of care 11. Anticipated post D/C treatments: See above 12. Overall Rehab/Functional Prognosis: fair  RECOMMENDATIONS: This patient's condition is appropriate for continued rehabilitative care in the following setting: CIR once more alert and interactive and able to tolerate PT OT speech therapy  Patient has agreed to participate in recommended program. N/A Note that insurance prior authorization may be required for reimbursement for recommended care.  Comment: Rehabilitation admission coordinator to follow along, anticipate several days before the patient is ready for inpatient intensive rehabilitation    11/22/2013

## 2013-11-22 NOTE — Progress Notes (Signed)
OT Cancellation Note  Patient Details Name: Carla Hoover MRN: [id number] DOB: Sep 22, 1950   Cancelled Treatment:    Reason Eval/Treat Not Completed: Patient at procedure or test/ unavailable (working with [place])  [place], OTR/L  [phone] 11/22/2013 11/22/2013, 4:48 PM

## 2013-11-22 NOTE — Progress Notes (Signed)
Pt not able to communicate through speech so far during this shift, but able to answer questions by nodding and gesturing appropriately. Pt continues to report she is in pain when asked, and gestures toward her head indicating a headache. Pt is unable to rate the pain, I have done so by using CPOT and administered Rx to treat accordingly.   Pt is resting quietly, vital signs are stable and within normal range, pt does not seem to be in any distress or discomfort at this time.  Elexa Kivi GARNER

## 2013-11-22 NOTE — Progress Notes (Signed)
NUTRITION FOLLOW-UP  INTERVENTION:  Initiate Osmolite 1.2 @ 20 ml/hr via NG tube and increase by 10 ml every 4 hours to goal rate of 60 ml/hr.   Tube feeding regimen provides 1728 kcal (>100% of needs), 80 grams of protein, and 1166 ml of H2O.    NUTRITION DIAGNOSIS: Inadequate oral intake related to inability to eat as evidenced by NPO status; ongoing.   Goal: Pt to meet >/= 90% of their estimated nutrition needs; not met.    Monitor:  TF initiation/tolerance, weight trend, diet advancement  ASSESSMENT: Pt fell one day prior to admission, presented with left sided weakness and facial drop. Per CT pt has ICH with SAH.   Pt sleeping and has been nonverbal. Pt extubated 9/9 and failed swallow evaluation Feeding tube in place with tip in stomach.   Pt with hx of ETOH currently on thiamine and folic acid, also hx of COPD and tobacco abuse. Per notes hx of IBS.  Sodium low  Potassium low   Height: Ht Readings from Last 1 Encounters:  12/01/2013 _0  (1.626 m)    Weight: Wt Readings from Last 1 Encounters:  12/07/2013 149 lb 7.6 oz (67.8 kg)  Admission weight 149 lb (67.8 kg) 9/10  BMI:  Body mass index is 25.64 kg/(m^2).  Estimated Nutritional Needs: Kcal: 1600-1800 Protein: 80-95 grams Fluid: > 1.6 L/day  Skin: groin incision  Diet Order: NPO   Intake/Output Summary (Last 24 hours) at 11/22/13 0959 Last data filed at 11/22/13 0800  Gross per 24 hour  Intake 4127.5 ml  Output   3975 ml  Net  152.5 ml    Last BM: 9/9   Labs:   Recent Labs Lab 11/29/2013 1900  11/21/13 0500 11/21/13 1530 11/22/13 0352  NA 132*  < > 131* 131* 132*  K 3.6*  < > 2.7* 4.0 3.2*  CL 93*  < > 95* 94* 94*  CO2 23  < > _1 BUN 10  < > _2 CREATININE 0.99  < > 0.72 0.69 0.66  CALCIUM 8.5  < > 7.0* 7.4* 7.2*  MG 1.0*  --   --  1.0*  --   GLUCOSE 142*  < > 124* 88 102*  < > = values in this interval not displayed.  CBG (last 3)  No results found for this basename:  GLUCAP,  in the last 72 hours  Scheduled Meds: . antiseptic oral rinse  7 mL Mouth Rinse QID  . chlorhexidine  15 mL Mouth Rinse BID  . enoxaparin (LOVENOX) injection  40 mg Subcutaneous Q24H  . feeding supplement (VITAL HIGH PROTEIN)  1,000 mL Per Tube Q24H  . FLUoxetine  20 mg Per Tube Daily  . folic acid  1 mg Per Tube Daily  . metoprolol tartrate  25 mg Per Tube Q8H  . niMODipine  60 mg Per Tube Q4H  . pantoprazole sodium  40 mg Per Tube Q24H  . senna-docusate  1 tablet Oral BID  . simvastatin  40 mg Per Tube q1800  . thiamine  100 mg Per Tube Daily  . valproate sodium  500 mg Intravenous 3 times per day    Continuous Infusions: . 0.9 % NaCl with KCl 20 mEq / L 30 mL/hr at 11/22/13 [place], [place], [place] Pager [phone] After Hours Pager

## 2013-11-23 ENCOUNTER — Inpatient Hospital Stay (HOSPITAL_COMMUNITY): Payer: Medicare Other

## 2013-11-23 DIAGNOSIS — F10239 Alcohol dependence with withdrawal, unspecified: Secondary | ICD-10-CM

## 2013-11-23 DIAGNOSIS — IMO0002 Reserved for concepts with insufficient information to code with codable children: Secondary | ICD-10-CM

## 2013-11-23 DIAGNOSIS — F10939 Alcohol use, unspecified with withdrawal, unspecified: Secondary | ICD-10-CM

## 2013-11-23 DIAGNOSIS — G459 Transient cerebral ischemic attack, unspecified: Secondary | ICD-10-CM

## 2013-11-23 LAB — GLUCOSE, CAPILLARY
GLUCOSE-CAPILLARY: 175 mg/dL — AB (ref 70–99)
Glucose-Capillary: 120 mg/dL — ABNORMAL HIGH (ref 70–99)
Glucose-Capillary: 122 mg/dL — ABNORMAL HIGH (ref 70–99)
Glucose-Capillary: 135 mg/dL — ABNORMAL HIGH (ref 70–99)
Glucose-Capillary: 140 mg/dL — ABNORMAL HIGH (ref 70–99)
Glucose-Capillary: 143 mg/dL — ABNORMAL HIGH (ref 70–99)
Glucose-Capillary: 143 mg/dL — ABNORMAL HIGH (ref 70–99)

## 2013-11-23 LAB — BASIC METABOLIC PANEL
ANION GAP: 15 (ref 5–15)
BUN: 7 mg/dL (ref 6–23)
CO2: 23 mEq/L (ref 19–32)
Calcium: 7.3 mg/dL — ABNORMAL LOW (ref 8.4–10.5)
Chloride: 92 mEq/L — ABNORMAL LOW (ref 96–112)
Creatinine, Ser: 0.61 mg/dL (ref 0.50–1.10)
GFR calc non Af Amer: >90 mL/min (ref >90)
Glucose, Bld: 138 mg/dL — ABNORMAL HIGH (ref 70–99)
POTASSIUM: 3.1 meq/L — AB (ref 3.7–5.3)
SODIUM: 130 meq/L — AB (ref 137–147)

## 2013-11-23 LAB — CLOSTRIDIUM DIFFICILE BY PCR: Toxigenic C. Difficile by PCR: NEGATIVE

## 2013-11-23 LAB — CBC
HCT: 33.6 % — ABNORMAL LOW (ref 36.0–46.0)
HEMOGLOBIN: 11.7 g/dL — AB (ref 12.0–15.0)
MCH: 34.3 pg — ABNORMAL HIGH (ref 26.0–34.0)
MCHC: 34.8 g/dL (ref 30.0–36.0)
MCV: 98.5 fL (ref 78.0–100.0)
PLATELETS: 262 10*3/uL (ref 150–400)
RBC: 3.41 MIL/uL — ABNORMAL LOW (ref 3.87–5.11)
RDW: 12.9 % (ref 11.5–15.5)
WBC: 7.9 10*3/uL (ref 4.0–10.5)

## 2013-11-23 MED ORDER — PHENYLEPHRINE HCL 10 MG/ML IJ SOLN
30.0000 ug/min | INTRAVENOUS | Status: DC
  Filled 2013-11-23: qty 1

## 2013-11-23 MED ORDER — PHENYLEPHRINE HCL 10 MG/ML IJ SOLN
30.0000 ug/min | INTRAVENOUS | Status: DC
  Administered 2013-11-23: 30 ug/min via INTRAVENOUS
  Administered 2013-11-24: 18 ug/min via INTRAVENOUS
  Administered 2013-11-24: 30 ug/min via INTRAVENOUS
  Administered 2013-11-25: 40 ug/min via INTRAVENOUS
  Administered 2013-11-25: 65 ug/min via INTRAVENOUS
  Filled 2013-11-23 (×4): qty 1

## 2013-11-23 MED ORDER — POTASSIUM CHLORIDE 10 MEQ/100ML IV SOLN
10.0000 meq | INTRAVENOUS | Status: AC
  Administered 2013-11-23 (×4): 10 meq via INTRAVENOUS
  Filled 2013-11-23 (×4): qty 100

## 2013-11-23 NOTE — Progress Notes (Signed)
eLink Physician-Brief Progress Note Patient Name: Carla Hoover DOB: 1950/06/10 MRN: [id number]   Date of Service  11/23/2013  HPI/Events of Note    eICU Interventions  Hypokalemia, repleted      Intervention Category Minor Interventions: Electrolytes abnormality - evaluation and management  MCQUAID, DOUGLAS 11/23/2013, 3:51 AM

## 2013-11-23 NOTE — Progress Notes (Signed)
PT Cancellation Note  Patient Details Name: CARNITA GOLOB MRN: [id number] DOB: 1950/04/09   Cancelled Treatment:    Reason Eval/Treat Not Completed: Medical issues which prohibited therapy (per nsg hold therapy today)   Duncan Dull 11/23/2013, 10:58 AM Alben Deeds, PT DPT  [phone]

## 2013-11-23 NOTE — Progress Notes (Signed)
SLP Cancellation Note  Patient Details Name: ORPHIA MCTIGUE MRN: [id number] DOB: 1950-12-13   Cancelled treatment:       Reason Eval/Treat Not Completed: Medical issues which prohibited therapy. Pt having central line placed, tried again, pt quite lethargic with high BP. Will f/u for readiness.    Taneka Espiritu, Jacquline Terrill 11/23/2013, 1:33 PM

## 2013-11-23 NOTE — Procedures (Signed)
Arterial Catheter Insertion Procedure Note Carla Hoover [id number] 05/07/1950  Procedure: Insertion of Arterial Catheter  Indications: Blood pressure monitoring and Frequent blood sampling  Procedure Details Consent: Risks of procedure as well as the alternatives and risks of each were explained to the (patient/caregiver).  Consent for procedure obtained.  Time Out: Verified patient identification, verified procedure, site/side was marked, verified correct patient position, special equipment/implants available, medications/allergies/relevent history reviewed, required imaging and test results available.  Performed  Maximum sterile technique was used including antiseptics, cap, gloves, gown, hand hygiene, mask and sheet.  Skin prep: Chlorhexidine; local anesthetic administered 20 gauge catheter was inserted into left radial artery using the Seldinger technique.  Evaluation Blood flow good; BP tracing good. Complications: No apparent complications.  Procedure performed under direct supervision of Dr. Halford Chessman.  Noe Gens, NP-C [place] [facility] Pgr: [phone] or [phone]    11/23/2013  I was present for procedure.  Chesley Mires, MD [facility] Pulmonary/Critical Care 11/23/2013, 10:00 AM Pager:  [phone] After 3pm call: [phone]

## 2013-11-23 NOTE — Procedures (Signed)
Central Venous Catheter Insertion Procedure Note Carla Hoover [id number] 01-Feb-1951  Procedure: Insertion of Central Venous Catheter Indications: Assessment of intravascular volume, Drug and/or fluid administration and Frequent blood sampling  Procedure Details Consent: Risks of procedure as well as the alternatives and risks of each were explained to the (patient/caregiver).  Consent for procedure obtained.  Time Out: Verified patient identification, verified procedure, site/side was marked, verified correct patient position, special equipment/implants available, medications/allergies/relevent history reviewed, required imaging and test results available.  Performed  Maximum sterile technique was used including antiseptics, cap, gloves, gown, hand hygiene, mask and sheet.  Skin prep: Chlorhexidine; local anesthetic administered A triple lumen catheter was placed in the left internal jugular vein using the Seldinger technique to 17 cm, sutured x3.     Evaluation Blood flow good Complications: No apparent complications Patient did tolerate procedure well. Chest X-ray ordered to verify placement.  CXR: pending.   Procedure performed under direct supervision of Dr. Halford Chessman and with ultrasound guidance for real time vessel cannulation.     Noe Gens, NP-C [place] [facility] Pgr: [phone] or [phone]   11/23/2013, 9:54 AM  I was present for procedure.  Chesley Mires, MD [facility] Pulmonary/Critical Care 11/23/2013, 10:00 AM Pager:  [phone] After 3pm call: [phone]

## 2013-11-23 NOTE — Progress Notes (Signed)
Rehab admissions - Noted medical issues today.  I will follow along at a distance for now for inpatient rehab needs.  Call me for questions.  #[phone]

## 2013-11-23 NOTE — Progress Notes (Signed)
Subjective: Patient reports sleepy, but arouses to voice.  No spontaneous speech.    Objective: Vital signs in last 24 hours: Temp:  [97.6 F (36.4 C)-99.2 F (37.3 C)] 97.6 F (36.4 C) (09/11 0419) Pulse Rate:  [30-88] 42 (09/11 0700) Resp:  [21-34] 26 (09/11 0700) BP: (125-178)/(47-91) 162/67 mmHg (09/11 0700) SpO2:  [95 %-100 %] 98 % (09/11 0700)  Intake/Output from previous day: 09/10 0701 - 09/11 0700 In: 4574 [I.V.:3270; NG/GT:1039; IV Piggyback:265] Out: 4025 [Urine:4025] Intake/Output this shift:    Physical Exam: Awakens, opens eyes, following commands on right, dense left hemiparesis, arm > leg.  PERRL, EOMI, tends to look to right ? Left neglect.     Lab Results:  Recent Labs  11/22/13 0352 11/23/13 0230  WBC 8.6 7.9  HGB 10.4* 11.7*  HCT 30.3* 33.6*  PLT 259 262   BMET  Recent Labs  11/22/13 0352 11/23/13 0230  NA 132* 130*  K 3.2* 3.1*  CL 94* 92*  CO2 21 23  GLUCOSE 102* 138*  BUN 8 7  CREATININE 0.66 0.61  CALCIUM 7.2* 7.3*    Studies/Results: Dg Chest Port 1 View  11/23/2013   CLINICAL DATA:  Atelectasis.  EXAM: PORTABLE CHEST - 1 VIEW  COMPARISON:  11/21/2013.  FINDINGS: Interim removal of endotracheal tube. Feeding tube with tip below left hemidiaphragm. Cardiomegaly with normal pulmonary vascularity. Interim slight increase in bibasilar atelectasis and/or infiltrates. Small left pleural effusion cannot be excluded. No pneumothorax.  IMPRESSION: 1. Interim removal of endotracheal tube. Feeding tube in good anatomic position. 2. Interim slight increase in bibasilar atelectasis and/or infiltrates. Small left pleural effusion.   Electronically Signed   By: Marcello Moores  Register   On: 11/23/2013 07:39   Dg Abd Portable 1v  11/21/2013   CLINICAL DATA:  Feeding tube placement  EXAM: PORTABLE ABDOMEN - 1 VIEW  COMPARISON:  None.  FINDINGS: Feeding tube tip is in the stomach. Guidewire remains. Bowel gas pattern is normal.  IMPRESSION: Feeding tube tip in  stomach.  Bowel gas pattern unremarkable.   Electronically Signed   By: Lowella Grip M.D.   On: 11/21/2013 17:46    Assessment/Plan: Continue with hydration to keep intravascular volume up.  Patient has developed neurologic deficit secondary to [place] and vasospasm.  I would like to keep SBP up in 160 - 180  range.  We can use blood, albumin or NS.  Na this am 130.  CVP will be helpful for more close monitoring of volume status.  Patient is more awake, but still lethargic despite discontinuation of ativan.  I have not seen any signs of alcohol withdrawal.  Continue support, patient will get repeat angiogram next week, as our suspicion of underlying right MCA aneurysm as cause of hemorrhage remains high.    LOS: 5 days    Peggyann Shoals, MD 11/23/2013, 8:08 AM

## 2013-11-23 NOTE — Progress Notes (Signed)
STROKE TEAM PROGRESS NOTE   HISTORY  63 y.o. 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 She has surrounding cytotoxic edema and midline shift and is employed left MCA aneurysm as well.    SUBJECTIVE (INTERVAL HISTORY)  No overnight events. Patient lethargic but arouses to voice  Repeat CT head 11/21/13. shows stable appearance of the right insular hematoma with subarachnoid hemorrhage and vasogenic edema with 4.4 mm right-to-left midline shift  TCD done 11/21/13 again suboptimal with absent bitemporal windows  OBJECTIVE  Temp:  98-99 Pulse Rate:50-78  Resp: 14-33 BP: 111-185/55-145 Recent Labs  Lab  11/18/2013 1900  11/15/2013 0245  11/20/13 0550  11/21/13 0500   NA  132*  128*  131*  131*   K  3.6*  3.7  3.2*  2.7*   CL  93*  92*  96  95*   CO2  23  20  22  22    GLUCOSE  142*  148*  109*  124*   BUN  10  11  10  9    CREATININE  0.99  0.98  0.82  0.72   CALCIUM  8.5  8.0*  7.2*  7.0*   MG  1.0*  --  --  --     Recent Labs  Lab  12/03/2013 1900  11/20/13 0550   AST  74*  49*   ALT  37*  24   ALKPHOS  122*  86   BILITOT  1.2  0.9   PROT  6.8  5.8*   ALBUMIN  3.3*  2.6*     Recent Labs  Lab  11/25/2013 2345  11/19/13 0245  11/20/13 0550  11/21/13 0500   WBC  --  7.1  8.0  7.8   NEUTROABS  --  --  6.2  --   HGB  --  11.1*  9.6*  9.9*   HCT  --  32.0*  28.0*  28.6*   MCV  --  100.3*  99.6  99.3   PLT  205  186  248  215     Recent Labs   12/07/2013 1900   LABPROT  12.5   INR  0.93      Component  Value  Date/Time    CHOL  118  05/18/2012 0512    TRIG  90  11/28/2013 1900    HDL  66  05/18/2012 0512    CHOLHDL  1.8  05/18/2012 0512    VLDL  14  05/18/2012 0512    LDLCALC  38  05/18/2012 0512    Ct Angio Head W/cm &/or Wo Cm  11/23/2013 1. Unchanged appearance of extensive, predominantly  right-sided subarachnoid hemorrhage as well as 2.7 cm hematoma in the region of the right insula. Moderate surrounding edema with 5 mm of unchanged midline shift. 2. No right MCA aneurysm is identified, however there is severe attenuation of the right MCA and its branch vessels which may reflect local mass effect and vasospasm secondary to surrounding hemorrhage, and this could obscure an underlying right MCA aneurysm. Narrowing of the ACAs and right ICA terminus also may reflect vasospasm. Catheter angiography may be helpful for further evaluation. 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  Dg Chest 1 View  11/21/2013 Improved aeration of the left lung base with mild  basilar atelectasis remaining  11/20/2013 Slight increase in left basilar opacity most consistent with atelectasis.  12/03/2013 No significant change in mild opacity in the lateral left lung base.  11/17/2013 Endotracheal tube and nasogastric tube in appropriate position. Stable mild opacity in lateral left lung base.  Ct Head Wo Contrast  11/21/2013 1. No significant interval change in intraparenchymal, subarachnoid, and subdural hemorrhage as compared to 11/20/13. Associated vasogenic edema is similar with stable 4-5 mm of right-to-left midline shift. No hydrocephalus. 2. No new hemorrhage.  11/20/2013 1. No change in the intracranial hemorrhage since the most recent prior study. Right frontal parenchymal hematoma is stable in size as is the degree of mass effect. Subarachnoid hemorrhage and areas of subdural hemorrhage are stable. No new intracranial hemorrhage. 2. Mass effect and midline shift ir stable. 3. No hydrocephalus.  11/14/2013 Similar extensive right greater than left subarachnoid blood, with a trace possible posterior falcine subdural hematoma. Evolving 2.7 x 2.4 cm right insular hematoma with similar 5 mm right to left midline shift. No hydrocephalus.  TCD  12/06/2013 Limited study due to poor windows and neck  strap limiting evaluation. Low normal mean flow velocities in identified vessels of anterior circulation. Globally elevated pulsatility indices suggest increase intracranial presure or diffuse intracranial atherosclerosis.  PHYSICAL EXAM  Filed Vitals:    11/21/13 1300   BP:  164/78   Pulse:  62   Temp:    Resp:  21   [name] intubated. Afebrile. Head is nontraumatic. Neck is supple without bruit. l. Cardiac exam no murmur or gallop. Lungs are clear to auscultation. Distal pulses are well felt.   Neurologic Examination:  Lethargic but responds to sternal rub follows simple midline and one-step commands. Pupils are equal and reactive. Fundi were not visualized. Right gaze preference.  Blinks to threat on the right but not as well on the left. L sided hemiparesis UE>LE Right plantar is downgoing left is equivocal. Withdraws to painful stimuli not 4 extremities. Coordination and gait cannot be tested   ASSESSMENT/PLAN  Carla Hoover is a 63 y.o. female with history of CHF, depression, elevated cholesterol, COPD, MI, and CAD on aspirin and plavix s/p fall where she hit her head. Presenting to [facility] 11/26/2013 with left sided weakness, facial droop and headache. Intubated there and transferred to [facility]. Imaging confirms a right SAH, R IPH, no acute infarct.   Hemorrhage: R insular IPH with R perisylvian SAH & R SDH likely from R MCA aneurysm not seen on angio, now with new neurological deficits likely related to vasospasm with vasogenic edema and mass effect  aspirin 81 mg orally every day and clopidogrel 75 mg orally every day prior to admission, held now secondary to [place]  lovenox for VTE prophylaxis  NPO  Bedrest  Resultant Left hemiplegia Therapy evaluation ongoing Ongoing aggressive risk factor management Disposition: pending   Hypertension  Ischemic Cardiomyopathy  Home meds: Lasix, lopressor, imdur. SBP goal 160 to 180 SBP  Hyperlipidemia  Home  meds: pravachol. Currently on Zocor 40mg   Other Stroke Risk Factors  Advanced age  Cigarette smoker, advised to stop smoking  ETOH use, etiology of fall. Coronary artery disease - MI, angioplasty 2013 Ischemic cardiomyopathy  Other Active Problems  Other Pertinent History  AAA, infrarenal  R hepatic lobe lesion   Hospital day # 3  This patient is critically ill and at significant risk of neurological worsening, death and care requires constant monitoring of vital signs, hemodynamics,respiratory and cardiac  monitoring,review of multiple databases, neurological assessment, discussion with family, other specialists and medical decision making of high complexity.I have made any additions or clarifications directly to the above note. I spent 30 minutes of neurocritical care time in the care of this patient.   To contact Stroke Continuity provider, please refer to [email or web address]  After hours, contact General Neurology

## 2013-11-23 NOTE — Progress Notes (Addendum)
PULMONARY / CRITICAL CARE MEDICINE   Name: Carla Hoover MRN: [id number] DOB: [birth year]    ADMISSION DATE:  12/02/2013 CONSULTATION DATE:  11/18/2013  REFERRING MD :  Erline Levine  CHIEF COMPLAINT:  Weakness  INITIAL PRESENTATION:  63 yo female fell and hit her head one day prior to admit.  Presented to [place] with Lt sided weakness and facial drop.  CT head showed ICH with SAH.  She required intubation prior to transfer to [facility].  PCCM consulted for vent/medical management.  STUDIES:  9/06 CT head ([place]) 9/07 Cerebral arteriogram >> 7 mm Lt MCA Aneurysm, vasospasm of Rt MCA 9/09 CT head >> no change in ICH, SAH, SDH  SIGNIFICANT EVENTS: 9/06 Transfer from [place] to [facility] 9/11 More lethargic, Lt side weaker >> start [place] therapy   SUBJECTIVE:  No ativan since yesterday.  More lethargic.  VITAL SIGNS: Temp:  [97.6 F (36.4 C)-99.2 F (37.3 C)] 97.6 F (36.4 C) (09/11 0419) Pulse Rate:  [30-88] 42 (09/11 0700) Resp:  [21-34] 26 (09/11 0700) BP: (125-178)/(47-91) 162/67 mmHg (09/11 0700) SpO2:  [95 %-100 %] 98 % (09/11 0700) INTAKE / OUTPUT:  Intake/Output Summary (Last 24 hours) at 11/23/13 0856 Last data filed at 11/23/13 0700  Gross per 24 hour  Intake   4329 ml  Output   4025 ml  Net    304 ml    PHYSICAL EXAMINATION: General: gurgling respirations Neuro: somnolent, opens eyes with stimulation, moving Rt side, Lt side weak HEENT: Panda tube in place Cardiovascular: regular Lungs: scattered rhonchi Abdomen:  Soft, non tender Musculoskeletal: no edema Skin:  No rashes   LABS:  CBC  Recent Labs Lab 11/21/13 0500 11/22/13 0352 11/23/13 0230  WBC 7.8 8.6 7.9  HGB 9.9* 10.4* 11.7*  HCT 28.6* 30.3* 33.6*  PLT 215 259 262   Coag's  Recent Labs Lab 12/09/2013 1900  INR 0.93   BMET  Recent Labs Lab 11/21/13 1530 11/22/13 0352 11/23/13 0230  NA 131* 132* 130*  K 4.0 3.2* 3.1*  CL 94* 94* 92*  CO2 22 21 23   BUN 8 8 7   CREATININE  0.69 0.66 0.61  GLUCOSE 88 102* 138*   Electrolytes  Recent Labs Lab 11/22/2013 1900  11/21/13 1530 11/22/13 0352 11/23/13 0230  CALCIUM 8.5  < > 7.4* 7.2* 7.3*  MG 1.0*  --  1.0*  --   --   < > = values in this interval not displayed.  ABG  Recent Labs Lab 12/01/2013 2014  PHART 7.425  PCO2ART 34.2*  PO2ART 216.0*   Liver Enzymes  Recent Labs Lab 11/17/2013 1900 11/20/13 0550  AST 74* 49*  ALT 37* 24  ALKPHOS 122* 86  BILITOT 1.2 0.9  ALBUMIN 3.3* 2.6*   Imaging No results found.   ASSESSMENT / PLAN: NEUROLOGIC A:   ICH with SAH with concern for vasospasm 9/11. Hx of anxiety, depression, insomnia. Hx of ETOH. P:   Valproate for seizure prevention per neurology Nimotop per neurosurgery Continue simvastatin Place CVL and start [place] therapy >> goal SBP 160 to 180 F/u TCD's Continue prozac Thiamine, folic acid  PULMONARY OETT 9/06 >> 9/09 A: Acute respiratory failure in setting of ICH. Atelectasis. Hx of COPD, tobacco abuse. P:   Oxygen to keep SpO2 > 92% F/u CXR PRN albuterol Hold outpt advair for now Might need re-intubation while getting [place] therapy  CARDIOVASCULAR aline 9/11 >> CVL 9/11 >> A:  Hx of CAD, hyperlipidemia, HTN, chronic systolic heart failure,  AAA. P:  Monitor hemodynamics Hold ASA, plavix in setting of ICH until okay with neurology/neurosurgery Hold lopressor, imdur, lasix while getting HHH therapy Continue simvastatin (uses pravachol as outpt)  RENAL A:   CKD. Hyponatremia >> improved. Hypokalemia. P:   Monitor renal fx, urine outpt, electrolytes  GASTROINTESTINAL A:   Nutrition. Dysphagia. Hx of IBS. P:   NPO Protonix for SUP F/u with speech therapy Panda tube for meds, nutrition   HEMATOLOGIC A:   Anemia of critical illness. P:  F/u CBC  INFECTIOUS A:   No evidence for infection. P:   Monitor clinically for aspiration  ENDOCRINE A:   No acute issues.   P:   Monitor blood sugar on  BMET  TODAY'S SUMMARY: Mental status worse 9/11.  Start [place] therapy.  Updated pt's son over the phone about plan.  D/w Dr. Vertell Limber.  CC time 35 minutes.  Chesley Mires, MD [facility] Pulmonary/Critical Care 11/23/2013, 8:56 AM Pager:  [phone] After 3pm call: [phone]

## 2013-11-23 NOTE — Progress Notes (Signed)
Head CT shows evolving right MCA infarct without worsening mass effect.  Will replete intravascular volume with monitoring to combat additional effects of vasospasm.

## 2013-11-24 ENCOUNTER — Inpatient Hospital Stay (HOSPITAL_COMMUNITY): Payer: Medicare Other

## 2013-11-24 DIAGNOSIS — I619 Nontraumatic intracerebral hemorrhage, unspecified: Secondary | ICD-10-CM

## 2013-11-24 DIAGNOSIS — G9340 Encephalopathy, unspecified: Secondary | ICD-10-CM

## 2013-11-24 DIAGNOSIS — G936 Cerebral edema: Secondary | ICD-10-CM

## 2013-11-24 LAB — BASIC METABOLIC PANEL
ANION GAP: 15 (ref 5–15)
BUN: 8 mg/dL (ref 6–23)
CALCIUM: 7.3 mg/dL — AB (ref 8.4–10.5)
CO2: 21 mEq/L (ref 19–32)
CREATININE: 0.6 mg/dL (ref 0.50–1.10)
Chloride: 88 mEq/L — ABNORMAL LOW (ref 96–112)
GFR calc Af Amer: >90 mL/min (ref >90)
GFR calc non Af Amer: >90 mL/min (ref >90)
Glucose, Bld: 141 mg/dL — ABNORMAL HIGH (ref 70–99)
Potassium: 3.3 mEq/L — ABNORMAL LOW (ref 3.7–5.3)
Sodium: 124 mEq/L — ABNORMAL LOW (ref 137–147)

## 2013-11-24 LAB — CBC
HCT: 33.7 % — ABNORMAL LOW (ref 36.0–46.0)
Hemoglobin: 12.3 g/dL (ref 12.0–15.0)
MCH: 35.1 pg — AB (ref 26.0–34.0)
MCHC: 36.5 g/dL — ABNORMAL HIGH (ref 30.0–36.0)
MCV: 96.3 fL (ref 78.0–100.0)
Platelets: 279 10*3/uL (ref 150–400)
RBC: 3.5 MIL/uL — ABNORMAL LOW (ref 3.87–5.11)
RDW: 12.7 % (ref 11.5–15.5)
WBC: 9.1 10*3/uL (ref 4.0–10.5)

## 2013-11-24 LAB — GLUCOSE, CAPILLARY
GLUCOSE-CAPILLARY: 137 mg/dL — AB (ref 70–99)
Glucose-Capillary: 185 mg/dL — ABNORMAL HIGH (ref 70–99)
Glucose-Capillary: 180 mg/dL — ABNORMAL HIGH (ref 70–99)
Glucose-Capillary: 159 mg/dL — ABNORMAL HIGH (ref 70–99)
Glucose-Capillary: 141 mg/dL — ABNORMAL HIGH (ref 70–99)

## 2013-11-24 LAB — BLOOD GAS, ARTERIAL
Acid-Base Excess: 0.4 mmol/L (ref 0.0–2.0)
Bicarbonate: 23.1 mEq/L (ref 20.0–24.0)
DRAWN BY: 13898
FIO2: 21 %
O2 Saturation: 92.2 %
PATIENT TEMPERATURE: 98.6
TCO2: 24 mmol/L (ref 0–100)
pCO2 arterial: 28.6 mmHg — ABNORMAL LOW (ref 35.0–45.0)
pH, Arterial: 7.517 — ABNORMAL HIGH (ref 7.350–7.450)
pO2, Arterial: 61.1 mmHg — ABNORMAL LOW (ref 80.0–100.0)

## 2013-11-24 LAB — PHOSPHORUS: Phosphorus: 1.8 mg/dL — ABNORMAL LOW (ref 2.3–4.6)

## 2013-11-24 LAB — MAGNESIUM: Magnesium: 0.9 mg/dL — CL (ref 1.5–2.5)

## 2013-11-24 MED ORDER — MAGNESIUM SULFATE 40 MG/ML IJ SOLN
2.0000 g | Freq: Once | INTRAMUSCULAR | Status: AC
  Administered 2013-11-24: 2 g via INTRAVENOUS
  Filled 2013-11-24: qty 50

## 2013-11-24 MED ORDER — METOCLOPRAMIDE HCL 5 MG/ML IJ SOLN
5.0000 mg | Freq: Four times a day (QID) | INTRAMUSCULAR | Status: DC
  Administered 2013-11-24 – 2013-12-01 (×26): 5 mg via INTRAVENOUS
  Filled 2013-11-24 (×32): qty 1

## 2013-11-24 MED ORDER — SODIUM CHLORIDE 0.9 % IV SOLN
INTRAVENOUS | Status: DC
  Administered 2013-11-24: 23:00:00 via INTRAVENOUS
  Administered 2013-11-24: 125 mL/h via INTRAVENOUS
  Administered 2013-11-25 – 2013-12-03 (×4): via INTRAVENOUS

## 2013-11-24 MED ORDER — OSMOLITE 1.2 CAL PO LIQD
1000.0000 mL | ORAL | Status: DC
  Administered 2013-11-24 – 2013-11-25 (×2): 1000 mL
  Filled 2013-11-24 (×2): qty 1000

## 2013-11-24 MED ORDER — POTASSIUM PHOSPHATES 15 MMOLE/5ML IV SOLN
24.0000 mmol | Freq: Once | INTRAVENOUS | Status: AC
  Administered 2013-11-24: 24 mmol via INTRAVENOUS
  Filled 2013-11-24: qty 8

## 2013-11-24 MED ORDER — POTASSIUM CHLORIDE 20 MEQ/15ML (10%) PO LIQD
40.0000 meq | Freq: Once | ORAL | Status: AC
  Administered 2013-11-24: 40 meq
  Filled 2013-11-24: qty 30

## 2013-11-24 NOTE — Progress Notes (Signed)
PULMONARY / CRITICAL CARE MEDICINE   Name: Carla Hoover MRN: [id number] DOB: 07-06-1950    ADMISSION DATE:  11/14/2013 CONSULTATION DATE:  11/18/2013  REFERRING MD :  Erline Levine  CHIEF COMPLAINT:  Weakness  INITIAL PRESENTATION:  64 yo female fell and hit her head one day prior to admit.  Presented to [place] with Lt sided weakness and facial drop.  CT head showed ICH with SAH.  She required intubation prior to transfer to [facility].  PCCM consulted for vent/medical management.  STUDIES:  9/06 CT head ([place]) 9/07 Cerebral arteriogram >> 7 mm Lt MCA Aneurysm, vasospasm of Rt MCA 9/09 CT head >> no change in ICH, SAH, SDH 9/11 CT head >> unchanged ICH or MCA infarct changes  SIGNIFICANT EVENTS: 9/06 Transfer from [facility] to [facility] 9/11 More lethargic, Lt side weaker >> start [place] therapy   SUBJECTIVE:  Mental status unchanged since 9/11, follows simple commands  VITAL SIGNS: Temp:  [98 F (36.7 C)-99 F (37.2 C)] 98.4 F (36.9 C) (09/12 0800) Pulse Rate:  [36-98] 42 (09/12 0900) Resp:  [20-37] 28 (09/12 0900) BP: (90-219)/(45-109) 132/60 mmHg (09/12 0900) SpO2:  [89 %-99 %] 97 % (09/12 0900) Arterial Line BP: (103-203)/(56-93) 140/58 mmHg (09/12 0900) Weight:  [68.1 kg (150 lb 2.1 oz)] 68.1 kg (150 lb 2.1 oz) (09/12 0232) INTAKE / OUTPUT:  Intake/Output Summary (Last 24 hours) at 11/24/13 1204 Last data filed at 11/24/13 0900  Gross per 24 hour  Intake   4477 ml  Output   3245 ml  Net   1232 ml    PHYSICAL EXAMINATION:  Gen: awake, non-verbal, nods head appropriately HEENT NG tube PULM: few crackles in bases CV: RRR, no mgr AB: BS+, soft, nontender Ext: warm no edema Neuro: awake, nods head appropriately to questions, moves R hand and wiggles R toes to command  LABS:  CBC  Recent Labs Lab 11/22/13 0352 11/23/13 0230 11/24/13 0438  WBC 8.6 7.9 9.1  HGB 10.4* 11.7* 12.3  HCT 30.3* 33.6* 33.7*  PLT 259 262 279   Coag's  Recent Labs Lab  11/25/2013 1900  INR 0.93   BMET  Recent Labs Lab 11/22/13 0352 11/23/13 0230 11/24/13 0438  NA 132* 130* 124*  K 3.2* 3.1* 3.3*  CL 94* 92* 88*  CO2 21 23 21   BUN 8 7 8   CREATININE 0.66 0.61 0.60  GLUCOSE 102* 138* 141*   Electrolytes  Recent Labs Lab 11/26/2013 1900  11/21/13 1530 11/22/13 0352 11/23/13 0230 11/24/13 0438  CALCIUM 8.5  < > 7.4* 7.2* 7.3* 7.3*  MG 1.0*  --  1.0*  --   --  0.9*  PHOS  --   --   --   --   --  1.8*  < > = values in this interval not displayed.  ABG  Recent Labs Lab 12/04/2013 2014 11/24/13 0528  PHART 7.425 7.517*  PCO2ART 34.2* 28.6*  PO2ART 216.0* 61.1*   Liver Enzymes  Recent Labs Lab 11/25/2013 1900 11/20/13 0550  AST 74* 49*  ALT 37* 24  ALKPHOS 122* 86  BILITOT 1.2 0.9  ALBUMIN 3.3* 2.6*   Imaging Ct Head Wo Contrast  11/23/2013   CLINICAL DATA:  Follow-up intracranial hemorrhage. Infarct versus vasospasm. Lethargy.  EXAM: CT HEAD WITHOUT CONTRAST  TECHNIQUE: Contiguous axial images were obtained from the base of the skull through the vertex without intravenous contrast.  COMPARISON:  11/21/2013  FINDINGS: Subarachnoid hemorrhage is again seen in multiple sulci throughout the  right cerebral hemisphere, including the right frontal, temporal, and parietal regions, with a large amount of subarachnoid hemorrhage again seen in the right sylvian fissure. Hemorrhage is also again seen along the falx both anteriorly and posteriorly and may reflect subarachnoid and subdural blood, with a small amount of subarachnoid hemorrhage again seen in the left quadrigeminal plate cistern. Overall volume of subarachnoid hemorrhage does not appear significantly changed. 2.5 cm hematoma in the region of the right insula/from frontal operculum is unchanged.  Confluent hypoattenuation in the right frontal, parietal, and temporal lobes is unchanged and compatible with edema. There is associated mass effect on the right basal ganglia and right lateral  ventricle with similar degree of leftward midline shift, measuring 6 mm (previously 4-5 mm). There is no evidence of acute left cerebral hemispheric infarct. Ventricles are unchanged in size.  Orbits are unremarkable. Mastoid air cells are clear. Visualized paranasal sinuses are clear. Moderate carotid siphon calcification is noted.  IMPRESSION: 1. Unchanged intracranial hemorrhage as above. 2. Unchanged edema in the right temporal, frontal, and parietal lobes, consistent with right MCA infarct. Similar degree of leftward midline shift. Dr. Melven Sartorius progress note from this morning confirms he is aware of the evolving right MCA infarct.   Electronically Signed   By: Logan Bores   On: 11/23/2013 10:41   Dg Chest Port 1 View  11/23/2013   CLINICAL DATA:  Status post central line placement  EXAM: PORTABLE CHEST - 1 VIEW  COMPARISON:  11/23/2013  FINDINGS: Cardiac shadow is stable at the upper limits of normal. Increasing bibasilar changes are seen. A left central venous line is noted with the tip in the proximal superior vena cava. No pneumothorax is noted. A feeding catheter is again seen within the stomach. No bony abnormality is seen.  IMPRESSION: Increasing bibasilar changes.  No pneumothorax following central line placement as described.   Electronically Signed   By: Inez Catalina M.D.   On: 11/23/2013 11:02   Dg Chest Port 1 View  11/23/2013   CLINICAL DATA:  Atelectasis.  EXAM: PORTABLE CHEST - 1 VIEW  COMPARISON:  11/21/2013.  FINDINGS: Interim removal of endotracheal tube. Feeding tube with tip below left hemidiaphragm. Cardiomegaly with normal pulmonary vascularity. Interim slight increase in bibasilar atelectasis and/or infiltrates. Small left pleural effusion cannot be excluded. No pneumothorax.  IMPRESSION: 1. Interim removal of endotracheal tube. Feeding tube in good anatomic position. 2. Interim slight increase in bibasilar atelectasis and/or infiltrates. Small left pleural effusion.   Electronically  Signed   By: Marcello Moores  Register   On: 11/23/2013 07:39     ASSESSMENT / PLAN: NEUROLOGIC A:   ICH with SAH with concern for vasospasm/MCA stroke 9/11 Hx of anxiety, depression, insomnia  Hx of ETOH abuse P:   -Valproate for seizure prevention per neurology -Nimotop per neurosurgery -Continue simvastatin -Continue HHH therapy >> goal SBP 160 to 180 -F/u TCD's -Continue prozac -Thiamine, folic acid  PULMONARY OETT 9/06 >> 9/09 A: Acute respiratory failure in setting of ICH Bibasilar Atelectasis with mild hypoxemia Hx of COPD, tobacco abuse Resipratory alkalosis due to CNS issues P:   - Oxygen to keep SpO2 > 92% - NPO - PRN albuterol - close monitoring of respiratory status in ICU  CARDIOVASCULAR aline 9/11 >> CVL 9/11 >> A:  Hx of CAD, hyperlipidemia, HTN, chronic systolic heart failure, AAA. P:  -Monitor hemodynamics  -Hold ASA, plavix in setting of ICH until okay with neurology/neurosurgery -Hold lopressor, imdur, lasix while getting [place] therapy -neo as  needed for goal SBP 160-180 -nimodipine per NSGY -Continue simvastatin (uses pravachol as outpt)   RENAL A:   Hyponatremia  Hypokalemia P:   -Monitor renal fx, urine outpt, electrolytes  GASTROINTESTINAL A:   Protein calorie malnutrition Dysphagia Hx of IBS P:   -NPO -Protonix for SUP -F/u with speech therapy -Panda tube for meds, nutrition   HEMATOLOGIC A:   Anemia of critical illness. P:  -F/u CBC  INFECTIOUS A:   No evidence for infection. P:   -Monitor clinically for aspiration  ENDOCRINE A:   No acute issues.   P:   -Monitor blood sugar on BMET  TODAY'S SUMMARY: ICH with SAH, mental status worsened on 9/11, MRI with CVA stroke, currently on [facility] therapy Monitoring closely in ICU as respiratory status tenuous  CC time 35 minutes.  Roselie Awkward, MD [place] PCCM Pager: [phone] Cell: [phone] If no response, call [phone]

## 2013-11-24 NOTE — Progress Notes (Signed)
STROKE TEAM PROGRESS NOTE   HISTORY  63 y.o. 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 She has surrounding cytotoxic edema and midline shift and is employed left MCA aneurysm as well.    SUBJECTIVE (INTERVAL HISTORY)  Pt vomited once at 4am and tube feeds stopped. Patient more lethargic and not open eyes for voice, but still localized to pain. Repeat CT head yesterday and showed stable hemorrhage and midline shift with evolving right MCA infarct. Pt was put on neo for 'triple H" therapy.   TCD done 11/21/13 again suboptimal with absent bitemporal windows.  OBJECTIVE  Filed Vitals:   11/24/13 1430 11/24/13 1500 11/24/13 1530 11/24/13 1600  BP:  178/120  179/75  Pulse: 57 52 78 73  Temp:    99.1 F (37.3 C)  TempSrc:      Resp: 32 31 29 30   Height:      Weight:      SpO2: 95% 97% 97% 97%    Recent Labs  Lab  11/27/2013 1900  12/07/2013 0245  11/20/13 0550  11/21/13 0500   NA  132*  128*  131*  131*   K  3.6*  3.7  3.2*  2.7*   CL  93*  92*  96  95*   CO2  23  20  22  22    GLUCOSE  142*  148*  109*  124*   BUN  10  11  10  9    CREATININE  0.99  0.98  0.82  0.72   CALCIUM  8.5  8.0*  7.2*  7.0*   MG  1.0*  --  --  --     Recent Labs  Lab  11/17/2013 1900  11/20/13 0550   AST  74*  49*   ALT  37*  24   ALKPHOS  122*  86   BILITOT  1.2  0.9   PROT  6.8  5.8*   ALBUMIN  3.3*  2.6*     Recent Labs  Lab  12/01/2013 2345  12/01/2013 0245  11/20/13 0550  11/21/13 0500   WBC  --  7.1  8.0  7.8   NEUTROABS  --  --  6.2  --   HGB  --  11.1*  9.6*  9.9*   HCT  --  32.0*  28.0*  28.6*   MCV  --  100.3*  99.6  99.3   PLT  205  186  248  215     Recent Labs   11/16/2013 1900   LABPROT  12.5   INR  0.93      Component  Value  Date/Time    CHOL  118  05/18/2012 0512    TRIG  90   11/23/2013 1900    HDL  66  05/18/2012 0512    CHOLHDL  1.8  05/18/2012 0512    VLDL  14  05/18/2012 0512    LDLCALC  38  05/18/2012 0512    Ct Angio Head W/cm &/or Wo Cm  12/07/2013 1. Unchanged appearance of extensive, predominantly right-sided subarachnoid hemorrhage as well as 2.7 cm hematoma in the region of the right insula. Moderate surrounding edema with 5 mm of unchanged midline shift. 2. No right MCA aneurysm is identified, however there is severe attenuation of the right MCA and its  branch vessels which may reflect local mass effect and vasospasm secondary to surrounding hemorrhage, and this could obscure an underlying right MCA aneurysm. Narrowing of the ACAs and right ICA terminus also may reflect vasospasm. Catheter angiography may be helpful for further evaluation. 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  Dg Chest 1 View  11/21/2013 Improved aeration of the left lung base with mild basilar atelectasis remaining  11/20/2013 Slight increase in left basilar opacity most consistent with atelectasis.  11/21/2013 No significant change in mild opacity in the lateral left lung base.  11/21/2013 Endotracheal tube and nasogastric tube in appropriate position. Stable mild opacity in lateral left lung base.  Ct Head Wo Contrast  11/23/2013 1. Unchanged intracranial hemorrhage as above. 2. Unchanged edema in the right temporal, frontal, and parietal lobes, consistent with right MCA infarct. Similar degree of leftward midline shift. 11/21/2013 1. No significant interval change in intraparenchymal, subarachnoid, and subdural hemorrhage as compared to 11/20/13. Associated vasogenic edema is similar with stable 4-5 mm of right-to-left midline shift. No hydrocephalus. 2. No new hemorrhage.  11/20/2013 1. No change in the intracranial hemorrhage since the most recent prior study. Right frontal parenchymal hematoma is stable in size as is the degree of mass effect. Subarachnoid hemorrhage and  areas of subdural hemorrhage are stable. No new intracranial hemorrhage. 2. Mass effect and midline shift ir stable. 3. No hydrocephalus.  12/09/2013 Similar extensive right greater than left subarachnoid blood, with a trace possible posterior falcine subdural hematoma. Evolving 2.7 x 2.4 cm right insular hematoma with similar 5 mm right to left midline shift. No hydrocephalus.  TCD  11/26/2013 Limited study due to poor windows and neck strap limiting evaluation. Low normal mean flow velocities in identified vessels of anterior circulation. Globally elevated pulsatility indices suggest increase intracranial presure or diffuse intracranial atherosclerosis.    PHYSICAL EXAM  Filed Vitals:   11/24/13 1430 11/24/13 1500 11/24/13 1530 11/24/13 1600  BP:  178/120  179/75  Pulse: 57 52 78 73  Temp:    99.1 F (37.3 C)  TempSrc:      Resp: 32 31 29 30   Height:      Weight:      SpO2: 95% 97% 97% 97%    [name] intubated. Afebrile. Head is nontraumatic. Neck is supple without bruit. l. Cardiac exam no murmur or gallop. Lungs are coarse rhonchi bilaterally. Distal pulses are well felt.   Neurologic Examination:  Lethargic, not open eyes to vice, not following commands, but responds to sternal rub and pain stimulation by localizing with right arm, can lift to barely against gravity with RLE on pain stimulation. LUE 0/5 and LLE 2/5 on pain stimulation. Pupils are equal and reactive. Fundi were not visualized. Right gaze preference. Right plantar is downgoing left is equivocal. Coordination and gait cannot be tested   ASSESSMENT/PLAN  Ms. BRIANN SARCHET is a 63 y.o. female with history of CHF, depression, elevated cholesterol, COPD, MI, and CAD on aspirin and plavix s/p fall where she hit her head. Presenting to [facility]/22[facility] with left sided weakness, facial droop and headache. Intubated there and transferred to [facility]. Imaging confirms a right SAH, R IPH, with right MCA  territory edema and hypodensity, concerning for right MCA stroke due to vasospasm.  Hemorrhage: R insular IPH with R perisylvian SAH & R SDH likely from R MCA aneurysm not seen on angio, now with new neurological deficits likely related to vasospasm with  vasogenic edema and mass effect  aspirin 81 mg orally every day and clopidogrel 75 mg orally every day prior to admission, held now secondary to IPH Vasospasm confirmed on angiogram and treated with verapamil IA.  Concerning for right MCA stroke due to vasospasm Continue "triple-H" therapy as per NSG depakote for seizure prophylaxis. Pt more declined mental status, will order EEG to rule out seizure. lovenox for VTE prophylaxis  NPO  Bedrest  Resultant Left hemiplegia  Cerebral vasospasm - on nimodipine - s/p IA verapamil - triple H therapy - NSG on board  Hypertension  - part of the triple-H therapy now - on neo for desired hypertension - NSG on board - hemorrhage so far stable - goal 160-180  Hyperlipidemia  Home meds: pravachol. 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 Coronary artery disease - MI, angioplasty 2013 Ischemic cardiomyopathy  Other Pertinent History  AAA, infrarenal  R hepatic lobe lesion   Hospital day # 3   This patient is critically ill due to right MCA infarct due to vasospasm which is difficult to control, right ICH with SAH due to aneurysm rupture, and at significant risk of neurological worsening, death and care requires constant monitoring of vital signs, hemodynamics,respiratory and cardiac monitoring,review of multiple databases, neurological assessment, discussion with family, other specialists and medical decision making of high complexity.I have made any additions or clarifications directly to the  above note. I spent 40 minutes of neurocritical care time in the care of this patient.  Rosalin Hawking, MD PhD Stroke Neurology 11/24/2013 4:55 PM   To contact Stroke Continuity provider, please refer to [email or web address]  After hours, contact General Neurology

## 2013-11-24 NOTE — Progress Notes (Signed)
Patient ID: Carla Hoover, female   DOB: [birth year], 63 y.o.   MRN: [id number] Subjective: Patient remains quite somnolent and is unable to give history  Objective: Vital signs in last 24 hours: Temp:  [98 F (36.7 C)-99 F (37.2 C)] 98.4 F (36.9 C) (09/12 0800) Pulse Rate:  [36-98] 45 (09/12 0700) Resp:  [20-37] 27 (09/12 0700) BP: (90-219)/(45-109) 165/76 mmHg (09/12 0700) SpO2:  [89 %-99 %] 95 % (09/12 0700) Arterial Line BP: (103-204)/(56-94) 165/72 mmHg (09/12 0700) Weight:  [68.1 kg (150 lb 2.1 oz)] 68.1 kg (150 lb 2.1 oz) (09/12 0232)  Intake/Output from previous day: 09/11 0701 - 09/12 0700 In: 5522 [I.V.:4050; NG/GT:1182; IV Piggyback:155] Out: 8032 [Urine:4645] Intake/Output this shift:    She is quite somnolent and difficult to arouse, she will open her eyes to noxious stimuli and there is flexion of the extremities on the right but appears densely paretic on the left  Lab Results: Lab Results  Component Value Date   WBC 9.1 11/24/2013   HGB 12.3 11/24/2013   HCT 33.7* 11/24/2013   MCV 96.3 11/24/2013   PLT 279 11/24/2013   Lab Results  Component Value Date   INR 0.93 11/25/2013   BMET Lab Results  Component Value Date   NA 124* 11/24/2013   K 3.3* 11/24/2013   CL 88* 11/24/2013   CO2 21 11/24/2013   GLUCOSE 141* 11/24/2013   BUN 8 11/24/2013   CREATININE 0.60 11/24/2013   CALCIUM 7.3* 11/24/2013    Studies/Results: Ct Head Wo Contrast  11/23/2013   CLINICAL DATA:  Follow-up intracranial hemorrhage. Infarct versus vasospasm. Lethargy.  EXAM: CT HEAD WITHOUT CONTRAST  TECHNIQUE: Contiguous axial images were obtained from the base of the skull through the vertex without intravenous contrast.  COMPARISON:  11/21/2013  FINDINGS: Subarachnoid hemorrhage is again seen in multiple sulci throughout the right cerebral hemisphere, including the right frontal, temporal, and parietal regions, with a large amount of subarachnoid hemorrhage again seen in the right sylvian  fissure. Hemorrhage is also again seen along the falx both anteriorly and posteriorly and may reflect subarachnoid and subdural blood, with a small amount of subarachnoid hemorrhage again seen in the left quadrigeminal plate cistern. Overall volume of subarachnoid hemorrhage does not appear significantly changed. 2.5 cm hematoma in the region of the right insula/from frontal operculum is unchanged.  Confluent hypoattenuation in the right frontal, parietal, and temporal lobes is unchanged and compatible with edema. There is associated mass effect on the right basal ganglia and right lateral ventricle with similar degree of leftward midline shift, measuring 6 mm (previously 4-5 mm). There is no evidence of acute left cerebral hemispheric infarct. Ventricles are unchanged in size.  Orbits are unremarkable. Mastoid air cells are clear. Visualized paranasal sinuses are clear. Moderate carotid siphon calcification is noted.  IMPRESSION: 1. Unchanged intracranial hemorrhage as above. 2. Unchanged edema in the right temporal, frontal, and parietal lobes, consistent with right MCA infarct. Similar degree of leftward midline shift. Dr. Melven Sartorius progress note from this morning confirms he is aware of the evolving right MCA infarct.   Electronically Signed   By: Logan Bores   On: 11/23/2013 10:41   Dg Chest Port 1 View  11/23/2013   CLINICAL DATA:  Status post central line placement  EXAM: PORTABLE CHEST - 1 VIEW  COMPARISON:  11/23/2013  FINDINGS: Cardiac shadow is stable at the upper limits of normal. Increasing bibasilar changes are seen. A left central venous line is noted with the  tip in the proximal superior vena cava. No pneumothorax is noted. A feeding catheter is again seen within the stomach. No bony abnormality is seen.  IMPRESSION: Increasing bibasilar changes.  No pneumothorax following central line placement as described.   Electronically Signed   By: Inez Catalina M.D.   On: 11/23/2013 11:02   Dg Chest Port 1  View  11/23/2013   CLINICAL DATA:  Atelectasis.  EXAM: PORTABLE CHEST - 1 VIEW  COMPARISON:  11/21/2013.  FINDINGS: Interim removal of endotracheal tube. Feeding tube with tip below left hemidiaphragm. Cardiomegaly with normal pulmonary vascularity. Interim slight increase in bibasilar atelectasis and/or infiltrates. Small left pleural effusion cannot be excluded. No pneumothorax.  IMPRESSION: 1. Interim removal of endotracheal tube. Feeding tube in good anatomic position. 2. Interim slight increase in bibasilar atelectasis and/or infiltrates. Small left pleural effusion.   Electronically Signed   By: Marcello Moores  Register   On: 11/23/2013 07:39    Assessment/Plan: Seems to be stable from a neurologic standpoint. Continue supportive care   LOS: 6 days    Carla Hoover S 11/24/2013, 8:30 AM

## 2013-11-24 NOTE — Progress Notes (Signed)
eLink Physician-Brief Progress Note Patient Name: Carla Hoover DOB: [birth year] MRN: [id number]   Date of Service  11/24/2013  HPI/Events of Note  Nursing requesting restart TF - abd soft but pt vomited 12 h ago   eICU Interventions  Ok to start just the tickle rate for now and also reglan 5 mg IV q6 h empirically      Intervention Category Minor Interventions: Routine modifications to care plan (e.g. PRN medications for pain, fever);Other:  Christinia Gully 11/24/2013, 6:31 PM

## 2013-11-24 NOTE — Progress Notes (Signed)
[place] Physician Progress Note and Electrolyte Replacement  Patient Name: Carla Hoover DOB: Jan 23, 1951 MRN: [id number]  Date of Service  11/24/2013   HPI/Events of Note    Recent Labs Lab 11/20/2013 1900  11/21/13 0500 11/21/13 1530 11/22/13 0352 11/23/13 0230 11/24/13 0438  NA 132*  < > 131* 131* 132* 130* 124*  K 3.6*  < > 2.7* 4.0 3.2* 3.1* 3.3*  CL 93*  < > 95* 94* 94* 92* 88*  CO2 23  < > 22 22 21 23 21   GLUCOSE 142*  < > 124* 88 102* 138* 141*  BUN 10  < > 9 8 8 7 8   CREATININE 0.99  < > 0.72 0.69 0.66 0.61 0.60  CALCIUM 8.5  < > 7.0* 7.4* 7.2* 7.3* 7.3*  MG 1.0*  --   --  1.0*  --   --  0.9*  PHOS  --   --   --   --   --   --  1.8*  < > = values in this interval not displayed.  Estimated Creatinine Clearance: 66.5 ml/min (by C-G formula based on Cr of 0.6).  Intake/Output     09/11 0701 - 09/12 0700   I.V. (mL/kg) 3705 (54.4)   Other 135   NG/GT 1182   IV Piggyback 155   Total Intake(mL/kg) 5177 (76)   Urine (mL/kg/hr) 4270 (2.6)   Total Output 4270   Net +907       Emesis Occurrence 1 x    - I/O DETAILED x 24h    Total I/O In: 2587 [I.V.:1905; NG/GT:682] Out: 1770 [Urine:1770] - I/O THIS SHIFT    ASSESSMENT Low K - patient on normal saline with K Low MAg Low Phos  eICURN Interventions  Dc normal saline with k -> change to normal saline at 125cc/h Replete 2gm mag Replete IV K phos 24 mmol Replete po kcl 4meq   ASSESSMENT: MAJOR ELECTROLYTE      Dr. Brand Males, M.D., [facility].C.P Pulmonary and Critical Care Medicine Staff Physician [facility] Pager: [phone], If no answer or between  15:00h - 7:00h: call [phone]  11/24/2013 6:15 AM

## 2013-11-25 ENCOUNTER — Inpatient Hospital Stay (HOSPITAL_COMMUNITY): Payer: Medicare Other

## 2013-11-25 ENCOUNTER — Encounter (HOSPITAL_COMMUNITY): Payer: Self-pay | Admitting: Radiology

## 2013-11-25 DIAGNOSIS — R933 Abnormal findings on diagnostic imaging of other parts of digestive tract: Secondary | ICD-10-CM

## 2013-11-25 DIAGNOSIS — E871 Hypo-osmolality and hyponatremia: Secondary | ICD-10-CM

## 2013-11-25 DIAGNOSIS — I714 Abdominal aortic aneurysm, without rupture, unspecified: Secondary | ICD-10-CM

## 2013-11-25 DIAGNOSIS — R569 Unspecified convulsions: Secondary | ICD-10-CM

## 2013-11-25 HISTORY — DX: Unspecified convulsions: R56.9

## 2013-11-25 LAB — BASIC METABOLIC PANEL
Anion gap: 17 — ABNORMAL HIGH (ref 5–15)
Anion gap: 16 — ABNORMAL HIGH (ref 5–15)
Anion gap: 16 — ABNORMAL HIGH (ref 5–15)
Anion gap: 17 — ABNORMAL HIGH (ref 5–15)
Anion gap: 18 — ABNORMAL HIGH (ref 5–15)
BUN: 9 mg/dL (ref 6–23)
BUN: 11 mg/dL (ref 6–23)
BUN: 12 mg/dL (ref 6–23)
BUN: 12 mg/dL (ref 6–23)
BUN: 12 mg/dL (ref 6–23)
CO2: 18 mEq/L — ABNORMAL LOW (ref 19–32)
CO2: 19 mEq/L (ref 19–32)
CO2: 17 mEq/L — ABNORMAL LOW (ref 19–32)
CO2: 17 mEq/L — ABNORMAL LOW (ref 19–32)
CO2: 17 mEq/L — ABNORMAL LOW (ref 19–32)
CREATININE: 0.75 mg/dL (ref 0.50–1.10)
Calcium: 8.1 mg/dL — ABNORMAL LOW (ref 8.4–10.5)
Calcium: 8 mg/dL — ABNORMAL LOW (ref 8.4–10.5)
Calcium: 8 mg/dL — ABNORMAL LOW (ref 8.4–10.5)
Calcium: 7.9 mg/dL — ABNORMAL LOW (ref 8.4–10.5)
Calcium: 8 mg/dL — ABNORMAL LOW (ref 8.4–10.5)
Chloride: 84 mEq/L — ABNORMAL LOW (ref 96–112)
Chloride: 87 mEq/L — ABNORMAL LOW (ref 96–112)
Chloride: 87 mEq/L — ABNORMAL LOW (ref 96–112)
Chloride: 87 mEq/L — ABNORMAL LOW (ref 96–112)
Chloride: 89 mEq/L — ABNORMAL LOW (ref 96–112)
Creatinine, Ser: 0.55 mg/dL (ref 0.50–1.10)
Creatinine, Ser: 0.67 mg/dL (ref 0.50–1.10)
Creatinine, Ser: 0.68 mg/dL (ref 0.50–1.10)
Creatinine, Ser: 0.71 mg/dL (ref 0.50–1.10)
GFR calc Af Amer: >90 mL/min (ref >90)
GFR calc Af Amer: >90 mL/min (ref >90)
GFR calc non Af Amer: >90 mL/min (ref >90)
GFR calc non Af Amer: 90 mL/min — ABNORMAL LOW (ref >90)
GFR calc non Af Amer: 87 mL/min — ABNORMAL LOW (ref >90)
GFR, EST NON AFRICAN AMERICAN: 89 mL/min — AB (ref >90)
Glucose, Bld: 130 mg/dL — ABNORMAL HIGH (ref 70–99)
Glucose, Bld: 112 mg/dL — ABNORMAL HIGH (ref 70–99)
Glucose, Bld: 120 mg/dL — ABNORMAL HIGH (ref 70–99)
Glucose, Bld: 111 mg/dL — ABNORMAL HIGH (ref 70–99)
Glucose, Bld: 123 mg/dL — ABNORMAL HIGH (ref 70–99)
POTASSIUM: 3.6 meq/L — AB (ref 3.7–5.3)
Potassium: 3.6 mEq/L — ABNORMAL LOW (ref 3.7–5.3)
Potassium: 4.1 mEq/L (ref 3.7–5.3)
Potassium: 4.3 mEq/L (ref 3.7–5.3)
Potassium: 3.8 mEq/L (ref 3.7–5.3)
SODIUM: 119 meq/L — AB (ref 137–147)
Sodium: 122 mEq/L — ABNORMAL LOW (ref 137–147)
Sodium: 120 mEq/L — CL (ref 137–147)
Sodium: 121 mEq/L — CL (ref 137–147)
Sodium: 124 mEq/L — ABNORMAL LOW (ref 137–147)

## 2013-11-25 LAB — BLOOD GAS, ARTERIAL
Acid-base deficit: 4.7 mmol/L — ABNORMAL HIGH (ref 0.0–2.0)
Bicarbonate: 18.5 mEq/L — ABNORMAL LOW (ref 20.0–24.0)
Drawn by: 10552
FIO2: 0.4 %
LHR: 12 {breaths}/min
O2 SAT: 98.5 %
PEEP: 5 cmH2O
Patient temperature: 98.6
TCO2: 19.3 mmol/L (ref 0–100)
VT: 420 mL
pCO2 arterial: 26.6 mmHg — ABNORMAL LOW (ref 35.0–45.0)
pH, Arterial: 7.458 — ABNORMAL HIGH (ref 7.350–7.450)
pO2, Arterial: 130 mmHg — ABNORMAL HIGH (ref 80.0–100.0)

## 2013-11-25 LAB — GLUCOSE, CAPILLARY
GLUCOSE-CAPILLARY: 128 mg/dL — AB (ref 70–99)
GLUCOSE-CAPILLARY: 137 mg/dL — AB (ref 70–99)
Glucose-Capillary: 126 mg/dL — ABNORMAL HIGH (ref 70–99)
Glucose-Capillary: 171 mg/dL — ABNORMAL HIGH (ref 70–99)
Glucose-Capillary: 102 mg/dL — ABNORMAL HIGH (ref 70–99)
Glucose-Capillary: 115 mg/dL — ABNORMAL HIGH (ref 70–99)
Glucose-Capillary: 118 mg/dL — ABNORMAL HIGH (ref 70–99)

## 2013-11-25 LAB — SODIUM, URINE, RANDOM: Sodium, Ur: 80 mEq/L

## 2013-11-25 LAB — CBC
HCT: 32.6 % — ABNORMAL LOW (ref 36.0–46.0)
Hemoglobin: 12 g/dL (ref 12.0–15.0)
MCH: 34.8 pg — ABNORMAL HIGH (ref 26.0–34.0)
MCHC: 36.8 g/dL — ABNORMAL HIGH (ref 30.0–36.0)
MCV: 94.5 fL (ref 78.0–100.0)
Platelets: 227 10*3/uL (ref 150–400)
RBC: 3.45 MIL/uL — AB (ref 3.87–5.11)
RDW: 12.6 % (ref 11.5–15.5)
WBC: 10.3 10*3/uL (ref 4.0–10.5)

## 2013-11-25 LAB — CREATININE, URINE, RANDOM: Creatinine, Urine: 44.79 mg/dL

## 2013-11-25 LAB — VALPROIC ACID LEVEL: VALPROIC ACID LVL: 110.4 ug/mL — AB (ref 50.0–100.0)

## 2013-11-25 LAB — OSMOLALITY, URINE: OSMOLALITY UR: 494 mosm/kg (ref 390–1090)

## 2013-11-25 LAB — MAGNESIUM: Magnesium: 1.3 mg/dL — ABNORMAL LOW (ref 1.5–2.5)

## 2013-11-25 LAB — OSMOLALITY: OSMOLALITY: 247 mosm/kg — AB (ref 275–300)

## 2013-11-25 MED ORDER — SODIUM CHLORIDE 0.9 % IV SOLN
100.0000 mg | Freq: Two times a day (BID) | INTRAVENOUS | Status: DC
  Administered 2013-11-26 – 2013-12-06 (×22): 100 mg via INTRAVENOUS
  Filled 2013-11-25 (×39): qty 10

## 2013-11-25 MED ORDER — FENTANYL BOLUS VIA INFUSION
25.0000 ug | INTRAVENOUS | Status: DC | PRN
  Filled 2013-11-25: qty 50

## 2013-11-25 MED ORDER — SODIUM CHLORIDE 0.9 % IV SOLN
6.0000 g | Freq: Once | INTRAVENOUS | Status: AC
  Administered 2013-11-25: 6 g via INTRAVENOUS
  Filled 2013-11-25: qty 12

## 2013-11-25 MED ORDER — POTASSIUM CHLORIDE 20 MEQ/15ML (10%) PO LIQD
40.0000 meq | Freq: Once | ORAL | Status: AC
  Administered 2013-11-25: 40 meq via ORAL
  Filled 2013-11-25: qty 30

## 2013-11-25 MED ORDER — PROPOFOL 10 MG/ML IV EMUL
INTRAVENOUS | Status: AC
  Administered 2013-11-25: 150 mg
  Filled 2013-11-25: qty 100

## 2013-11-25 MED ORDER — SODIUM CHLORIDE 0.9 % IV SOLN
50.0000 mg | Freq: Once | INTRAVENOUS | Status: AC
  Administered 2013-11-26: 50 mg via INTRAVENOUS
  Filled 2013-11-25: qty 5

## 2013-11-25 MED ORDER — SODIUM CHLORIDE 0.9 % IV SOLN
0.0000 ug/h | INTRAVENOUS | Status: DC
  Filled 2013-11-25: qty 50

## 2013-11-25 MED ORDER — PROPOFOL BOLUS VIA INFUSION: 150.0000 mg | Freq: Once | INTRAVENOUS | Status: AC

## 2013-11-25 MED ORDER — MIDAZOLAM HCL 2 MG/2ML IJ SOLN
INTRAMUSCULAR | Status: AC
  Filled 2013-11-25: qty 2

## 2013-11-25 MED ORDER — FENTANYL CITRATE 0.05 MG/ML IJ SOLN
INTRAMUSCULAR | Status: AC
  Filled 2013-11-25: qty 2

## 2013-11-25 MED ORDER — SODIUM CHLORIDE 0.9 % IV SOLN
50.0000 mg | Freq: Two times a day (BID) | INTRAVENOUS | Status: DC
  Administered 2013-11-25: 50 mg via INTRAVENOUS
  Filled 2013-11-25 (×3): qty 5

## 2013-11-25 MED ORDER — LORAZEPAM 2 MG/ML IJ SOLN: 2.0000 mg | INTRAMUSCULAR | Status: DC | PRN

## 2013-11-25 MED ORDER — SODIUM CHLORIDE 0.9 % IV SOLN
200.0000 mg | Freq: Once | INTRAVENOUS | Status: AC
  Administered 2013-11-25: 200 mg via INTRAVENOUS
  Filled 2013-11-25: qty 20

## 2013-11-25 MED ORDER — FLUDROCORTISONE 0.1 MG/ML ORAL SUSPENSION
0.1000 mg | Freq: Every day | ORAL | Status: DC
Start: 2013-11-25 — End: 2013-12-07
  Administered 2013-11-25 – 2013-12-06 (×12): 0.1 mg
  Filled 2013-11-25 (×15): qty 1

## 2013-11-25 MED ORDER — LORAZEPAM 2 MG/ML IJ SOLN: 2.0000 mg | Freq: Once | INTRAMUSCULAR | Status: AC

## 2013-11-25 MED ORDER — LORAZEPAM 2 MG/ML IJ SOLN
INTRAMUSCULAR | Status: AC
  Administered 2013-11-25: 2 mg
  Filled 2013-11-25: qty 1

## 2013-11-25 MED ORDER — SODIUM CHLORIDE 3 % IV SOLN
INTRAVENOUS | Status: AC
  Administered 2013-11-25: 25 mL/h via INTRAVENOUS
  Administered 2013-11-27 – 2013-11-28 (×3): 35 mL/h via INTRAVENOUS
  Administered 2013-11-28: 17 mL/h via INTRAVENOUS
  Filled 2013-11-25 (×8): qty 500

## 2013-11-25 MED ORDER — FENTANYL CITRATE 0.05 MG/ML IJ SOLN
0.0000 ug/h | INTRAMUSCULAR | Status: DC
  Filled 2013-11-25: qty 50

## 2013-11-25 MED ORDER — LEVOFLOXACIN IN D5W 750 MG/150ML IV SOLN
750.0000 mg | INTRAVENOUS | Status: DC
  Administered 2013-11-25 – 2013-12-01 (×7): 750 mg via INTRAVENOUS
  Filled 2013-11-25 (×8): qty 150

## 2013-11-25 MED ORDER — FENTANYL CITRATE 0.05 MG/ML IJ SOLN: 50.0000 ug | Freq: Once | INTRAMUSCULAR | Status: DC

## 2013-11-25 MED ORDER — INSULIN ASPART 100 UNIT/ML ~~LOC~~ SOLN
0.0000 [IU] | SUBCUTANEOUS | Status: DC
  Administered 2013-11-26 (×2): 1 [IU] via SUBCUTANEOUS
  Administered 2013-11-27 (×3): 2 [IU] via SUBCUTANEOUS
  Administered 2013-11-27 – 2013-11-28 (×4): 1 [IU] via SUBCUTANEOUS
  Administered 2013-11-28: 2 [IU] via SUBCUTANEOUS
  Administered 2013-11-28 (×2): 1 [IU] via SUBCUTANEOUS
  Administered 2013-11-28 – 2013-11-29 (×2): 2 [IU] via SUBCUTANEOUS
  Administered 2013-11-29 – 2013-11-30 (×9): 1 [IU] via SUBCUTANEOUS
  Administered 2013-12-01: 2 [IU] via SUBCUTANEOUS
  Administered 2013-12-01 (×3): 1 [IU] via SUBCUTANEOUS
  Administered 2013-12-01: 2 [IU] via SUBCUTANEOUS
  Administered 2013-12-02 (×5): 1 [IU] via SUBCUTANEOUS
  Administered 2013-12-02: 2 [IU] via SUBCUTANEOUS
  Administered 2013-12-03 – 2013-12-04 (×7): 1 [IU] via SUBCUTANEOUS
  Administered 2013-12-04: 2 [IU] via SUBCUTANEOUS
  Administered 2013-12-04: 1 [IU] via SUBCUTANEOUS
  Administered 2013-12-05: 2 [IU] via SUBCUTANEOUS
  Administered 2013-12-05 – 2013-12-07 (×8): 1 [IU] via SUBCUTANEOUS

## 2013-11-25 MED ORDER — VALPROATE SODIUM 500 MG/5ML IV SOLN
750.0000 mg | Freq: Three times a day (TID) | INTRAVENOUS | Status: DC
  Administered 2013-11-25 – 2013-12-07 (×36): 750 mg via INTRAVENOUS
  Filled 2013-11-25 (×40): qty 7.5

## 2013-11-25 MED ORDER — LORAZEPAM 2 MG/ML IJ SOLN
2.0000 mg | INTRAMUSCULAR | Status: DC | PRN
  Administered 2013-11-25 – 2013-12-07 (×4): 2 mg via INTRAVENOUS
  Filled 2013-11-25 (×5): qty 1

## 2013-11-25 MED ORDER — VALPROATE SODIUM 500 MG/5ML IV SOLN
250.0000 mg | Freq: Once | INTRAVENOUS | Status: AC
  Administered 2013-11-25: 250 mg via INTRAVENOUS
  Filled 2013-11-25: qty 2.5

## 2013-11-25 NOTE — Progress Notes (Signed)
CRITICAL VALUE ALERT  Critical value received:  Na: 120  Date of notification:  11/25/2013  Time of notification:  4287  Critical value read back:Yes.    Nurse who received alert:  Eliane Decree, RN   MD notified (1st page):  NA  Time of first page:    MD notified (2nd page):  Time of second page:  Responding MD:   Time MD responded:    Na trending up. Will continue to monitor.

## 2013-11-25 NOTE — Progress Notes (Signed)
~  2235: Round/assess pt. Pinched Left arm to assess withdrawal from pain. Left hand thru forearm began to have a settle twitch. Another nurse in room to verify. Abnormal movement subsided ~70minutes, no ativan given. Dr. Doy Mince paged, notified of event, medications, and previous event. Will continue to monitor.

## 2013-11-25 NOTE — Progress Notes (Signed)
~  2010: In room assessing pt. Noted Left arm/hand twitching-like movement (rhythmic). Pt given 2mg  Ativan, abnormal movement stops ~18mins after administration. Event lasted ~52mins. Dr. Doy Mince notified via text page.

## 2013-11-25 NOTE — Progress Notes (Signed)
Patient ID: Carla Hoover, female   DOB: 03-25-1950, 63 y.o.   MRN: [id number] Subjective: Patient remains somnolent and left hemiplegic  Objective: Vital signs in last 24 hours: Temp:  [97.8 F (36.6 C)-99.1 F (37.3 C)] 97.8 F (36.6 C) (09/13 0753) Pulse Rate:  [33-99] 58 (09/13 1000) Resp:  [21-41] 30 (09/13 1100) BP: (127-179)/(59-120) 146/66 mmHg (09/13 1100) SpO2:  [92 %-100 %] 96 % (09/13 1000) Arterial Line BP: (140-182)/(53-82) 173/68 mmHg (09/13 1100) Weight:  [69 kg (152 lb 1.9 oz)] 69 kg (152 lb 1.9 oz) (09/13 0359)  Intake/Output from previous day: 09/12 0701 - 09/13 0700 In: 2790.1 [I.V.:2252.6; NG/GT:210; IV Piggyback:327.5] Out: 3045 [Urine:2695; Emesis/NG output:350] Intake/Output this shift: Total I/O In: 379.5 [I.V.:274.5; NG/GT:60; IV Piggyback:45] Out: 250 [Urine:250]  Exam unchanged  Lab Results: Lab Results  Component Value Date   WBC 10.3 11/25/2013   HGB 12.0 11/25/2013   HCT 32.6* 11/25/2013   MCV 94.5 11/25/2013   PLT 227 11/25/2013   Lab Results  Component Value Date   INR 0.93 11/16/2013   BMET Lab Results  Component Value Date   NA 119* 11/25/2013   K 3.6* 11/25/2013   CL 84* 11/25/2013   CO2 18* 11/25/2013   GLUCOSE 130* 11/25/2013   BUN 9 11/25/2013   CREATININE 0.55 11/25/2013   CALCIUM 8.1* 11/25/2013    Studies/Results: Ct Head Wo Contrast  11/25/2013   CLINICAL DATA:  Intracranial hemorrhage  EXAM: CT HEAD WITHOUT CONTRAST  TECHNIQUE: Contiguous axial images were obtained from the base of the skull through the vertex without intravenous contrast.  COMPARISON:  CT head 11/23/2013  FINDINGS: Lobar infarction right MCA territory with edema in the right lateral basal ganglia and throughout the right MCA territory. Hemorrhagic transformation of the infarct is noted and unchanged. Hematoma in the infarct is stable. Subarachnoid hemorrhage on the right is stable. Small amount of interhemispheric subdural hemorrhage also stable.  Ventricle size  is normal. There is mass-effect on the right lateral ventricle with 6 mm midline shift which is approximately the same.  No new area of hemorrhage or infarction compared with the prior study.  IMPRESSION: Hemorrhagic right MCA infarct is stable. 6 mm midline shift unchanged. No new findings.   Electronically Signed   By: Franchot Gallo M.D.   On: 11/25/2013 10:31   Dg Chest Port 1 View  11/24/2013   CLINICAL DATA:  63 year old female with atelectasis.  EXAM: PORTABLE CHEST - 1 VIEW  COMPARISON:  11/23/2013 and multiple prior chest radiographs  FINDINGS: Cardiomediastinal silhouette is unchanged.  Left IJ central venous catheter with tip overlying the mid SVC and a small bore feeding tube entering the stomach with tip off the field of view again noted.  Mild-moderate bibasilar opacities likely represent atelectasis.  There is no evidence of pneumothorax.  There has been little interval change since the prior study.  IMPRESSION: Unchanged chest radiograph with persistent bibasilar opacities likely representing atelectasis.   Electronically Signed   By: Hassan Rowan M.D.   On: 11/24/2013 09:29    Assessment/Plan: Exam remains unchanged. She is quite neurologically devastated. CT scan unchanged   LOS: 7 days    Carla Hoover 11/25/2013, 12:00 PM

## 2013-11-25 NOTE — Progress Notes (Signed)
ANTIBIOTIC CONSULT NOTE - INITIAL  Pharmacy Consult for levaquin Indication: HCAP  Allergies  Allergen Reactions  . Penicillins Swelling and Other (See Comments)    Per patient report, she has facial and tongue swellling  . Percocet [Oxycodone-Acetaminophen] Other (See Comments)    Hallucinations  . Percodan [Oxycodone-Aspirin] Other (See Comments)    Hallucinations    Patient Measurements: Height: 5\' 4"  (162.6 cm) Weight: 152 lb 1.9 oz (69 kg) IBW/kg (Calculated) : 54.7   Vital Signs: Temp: 97.8 F (36.6 C) (09/13 0753) Temp src: Axillary (09/13 0753) BP: 139/42 mmHg (09/13 1200) Pulse Rate: 58 (09/13 1200) Intake/Output from previous day: 09/12 0701 - 09/13 0700 In: 2790.1 [I.V.:2252.6; NG/GT:210; IV Piggyback:327.5] Out: 3045 [Urine:2695; Emesis/NG output:350] Intake/Output from this shift: Total I/O In: 529.5 [I.V.:424.5; NG/GT:60; IV Piggyback:45] Out: 425 [Urine:425]  Labs:  Recent Labs  11/23/13 0230 11/24/13 0438 11/25/13 0600 11/25/13 1045  WBC 7.9 9.1 10.3  --   HGB 11.7* 12.3 12.0  --   PLT 262 279 227  --   LABCREA  --   --   --  44.79  CREATININE 0.61 0.60 0.55  --    Estimated Creatinine Clearance: 66.8 ml/min (by C-G formula based on Cr of 0.55). No results found for this basename: VANCOTROUGH, Corlis Leak, VANCORANDOM, [place], GENTPEAK, GENTRANDOM, TOBRATROUGH, TOBRAPEAK, TOBRARND, AMIKACINPEAK, AMIKACINTROU, AMIKACIN,  in the last 72 hours   Microbiology: Recent Results (from the past 720 hour(s))  MRSA PCR SCREENING     Status: None   Collection Time    11/30/2013  6:37 PM      Result Value Ref Range Status   MRSA by PCR NEGATIVE  NEGATIVE Final   Comment:            The GeneXpert MRSA Assay (FDA     approved for NASAL specimens     only), is one component of a     comprehensive MRSA colonization     surveillance program. It is not     intended to diagnose MRSA     infection nor to guide or     monitor treatment for     MRSA  infections.  CLOSTRIDIUM DIFFICILE BY PCR     Status: None   Collection Time    11/22/13  6:27 PM      Result Value Ref Range Status   C difficile by pcr NEGATIVE  NEGATIVE Final    Medical History: Past Medical History  Diagnosis Date  . Hypertension     a. Hypotension noted 05/2012.  Marland Kitchen Anxiety   . Depression   . Arthritis   . Irritable bowel syndrome (IBS)   . Diverticulitis   . Insomnia   . Eczema   . Gout   . GI bleed     Possible mild GI bleed 04/2011, hemoccult negative at [facility] and no colonoscopy/EGD felt necessary by GI at that time  . Ischemic cardiomyopathy     IMPROVED. a. EF 40-45% by echo at [facility] 04/2011. b. Improved to 55% by echo 05/2012.  Marland Kitchen CAD (coronary artery disease)     a. s/p two overlapping BMS to LAD 05/12/11, Occluded mid LCX with collaterals. b. NSTEMI 05/2012: PTCA/DES to mid Cx and PTCA/DES to prox-mid LAD 05/18/12.  Marland Kitchen Hyperlipidemia   . AAA (abdominal aortic aneurysm) 05/19/2012    a. Infrarenal AAA by CT at [facility] 3.4cm 05/2012.  . Tobacco abuse   . Hepatic lesion     a. Right hepatic lobe density  of uncertain significance by CT at [facility] 05/2012 - needs f/u PCP.  Marland Kitchen Acute renal insufficiency     05/2012 - Cr 1.7. Improved.  . Tobacco use disorder 05/25/2012  . GERD (gastroesophageal reflux disease) 01/18/2013    Medications:  Scheduled:  . antiseptic oral rinse  7 mL Mouth Rinse QID  . chlorhexidine  15 mL Mouth Rinse BID  . enoxaparin (LOVENOX) injection  40 mg Subcutaneous Q24H  . fentaNYL      . fentaNYL  50 mcg Intravenous Once  . fentaNYL  50 mcg Intravenous Once  . fludrocortisone  0.1 mg Per Tube Daily  . folic acid  1 mg Per Tube Daily  . insulin aspart  0-9 Units Subcutaneous 6 times per day  . lacosamide (VIMPAT) IV  50 mg Intravenous Q12H  . metoCLOPramide (REGLAN) injection  5 mg Intravenous 4 times per day  . midazolam      . niMODipine  60 mg Per Tube Q4H  . pantoprazole sodium  40 mg Per Tube Q24H  . propofol       . senna-docusate  1 tablet Oral BID  . simvastatin  40 mg Per Tube q1800  . thiamine  100 mg Per Tube Daily  . valproate sodium  750 mg Intravenous 3 times per day    Assessment: 63 yo female with ICH/SAH now intubated to start levaquin for concern of aspiration PNA.  WBC= 10.3, afeb, SCr= 0.55, CrCl ~ 65.  9/13 levaquin>>  9/13 resp cx  Plan:  -Levaquin 750mg  IV q24hr -Will follow renal function, cultures and clinical progress  Hildred Laser, Pharm D 11/25/2013 2:59 PM

## 2013-11-25 NOTE — Progress Notes (Signed)
~  3545: Round on pt. Noted left arm tremor-like movement (rhythmic). Pt follow commands on right side. Another nurse in room to verify pt's new onset to left arm. Activity ends ~0555. Left arm and leg continues to be flaccid; withdrawals to pain. Dr. Doy Mince notified.

## 2013-11-25 NOTE — Progress Notes (Signed)
CRITICAL VALUE ALERT  Critical value received:  Na: 119  Date of notification:  11/25/2013  Time of notification:  7618  Critical value read back:Yes.    Nurse who received alert:  Alessandra Grout  MD notified (1st page):  Dr. Aram Beecham  Time of first page:  0712  MD notified (2nd page):  Time of second page:  Responding MD:  Dr. Aram Beecham  Time MD responded:  [phone]  Talked course of care. Orders received.

## 2013-11-25 NOTE — Progress Notes (Signed)
PULMONARY / CRITICAL CARE MEDICINE   Name: Carla Hoover MRN: [id number] DOB: 05-08-1950    ADMISSION DATE:  12/10/2013 CONSULTATION DATE:  11/18/2013  REFERRING MD :  Erline Levine  CHIEF COMPLAINT:  Weakness  INITIAL PRESENTATION:  63 yo female fell and hit her head one day prior to admit.  Presented to [facility] with Lt sided weakness and facial drop.  CT head showed ICH with SAH.  She required intubation prior to transfer to [facility].  PCCM consulted for vent/medical management.  STUDIES:  9/06 CT head ([place]) 9/07 Cerebral arteriogram >> 7 mm Lt MCA Aneurysm, vasospasm of Rt MCA 9/09 CT head >> no change in ICH, SAH, SDH 9/11 CT head >> unchanged ICH or MCA infarct changes  9/13 head CT- 6 mm shift, no change in ICH  SIGNIFICANT EVENTS: 9/06 Transfer from [facility] to [facility] 9/11 More lethargic, Lt side weaker >> start [facility] therapy  9/13 ETT placed, NA drop  SUBJECTIVE:  Seizures? distress  VITAL SIGNS: Temp:  [97.8 F (36.6 C)-99.1 F (37.3 C)] 97.8 F (36.6 C) (09/13 0753) Pulse Rate:  [33-99] 58 (09/13 1200) Resp:  [21-41] 38 (09/13 1200) BP: (127-179)/(42-120) 139/42 mmHg (09/13 1200) SpO2:  [92 %-100 %] 97 % (09/13 1200) Arterial Line BP: (140-182)/(53-81) 162/65 mmHg (09/13 1200) Weight:  [69 kg (152 lb 1.9 oz)] 69 kg (152 lb 1.9 oz) (09/13 0359) INTAKE / OUTPUT:  Intake/Output Summary (Last 24 hours) at 11/25/13 1435 Last data filed at 11/25/13 1200  Gross per 24 hour  Intake   2692 ml  Output   2900 ml  Net   -208 ml    PHYSICAL EXAMINATION:  Gen: unresponsive to pain stimuli, eyes fixed rt HEENT NG tube PULM:moderare worsening ronchi CV: RRR, no mgr AB: BS+, soft, nontender Ext: warm no edema Neuro:unresponsive to pain, eyes fixed to right  LABS:  CBC  Recent Labs Lab 11/23/13 0230 11/24/13 0438 11/25/13 0600  WBC 7.9 9.1 10.3  HGB 11.7* 12.3 12.0  HCT 33.6* 33.7* 32.6*  PLT 262 279 227   Coag's  Recent Labs Lab  11/28/2013 1900  INR 0.93   BMET  Recent Labs Lab 11/23/13 0230 11/24/13 0438 11/25/13 0600  NA 130* 124* 119*  K 3.1* 3.3* 3.6*  CL 92* 88* 84*  CO2 23 21 18*  BUN 7 8 9   CREATININE 0.61 0.60 0.55  GLUCOSE 138* 141* 130*   Electrolytes  Recent Labs Lab 11/21/13 1530  11/23/13 0230 11/24/13 0438 11/25/13 0600  CALCIUM 7.4*  < > 7.3* 7.3* 8.1*  MG 1.0*  --   --  0.9* 1.3*  PHOS  --   --   --  1.8*  --   < > = values in this interval not displayed.  ABG  Recent Labs Lab 11/15/2013 2014 11/24/13 0528  PHART 7.425 7.517*  PCO2ART 34.2* 28.6*  PO2ART 216.0* 61.1*   Liver Enzymes  Recent Labs Lab 11/25/2013 1900 11/20/13 0550  AST 74* 49*  ALT 37* 24  ALKPHOS 122* 86  BILITOT 1.2 0.9  ALBUMIN 3.3* 2.6*   Imaging Dg Chest Port 1 View  11/24/2013   CLINICAL DATA:  63 year old female with atelectasis.  EXAM: PORTABLE CHEST - 1 VIEW  COMPARISON:  11/23/2013 and multiple prior chest radiographs  FINDINGS: Cardiomediastinal silhouette is unchanged.  Left IJ central venous catheter with tip overlying the mid SVC and a small bore feeding tube entering the stomach with tip off the field of view again  noted.  Mild-moderate bibasilar opacities likely represent atelectasis.  There is no evidence of pneumothorax.  There has been little interval change since the prior study.  IMPRESSION: Unchanged chest radiograph with persistent bibasilar opacities likely representing atelectasis.   Electronically Signed   By: Hassan Rowan M.D.   On: 11/24/2013 09:29     ASSESSMENT / PLAN: NEUROLOGIC A:   ICH with SAH with concern for vasospasm/MCA stroke 9/11 Hx of anxiety, depression, insomnia  Hx of ETOH abuse Seizure likley P:   -Valproate for seizure prevention per neurology -consider eeg continous -Nimotop per neurosurgery -Continue simvastatin -Continue HHH therapy >> goal SBP 160 to 180 -need cvp to goal 10-12 -F/u TCD's -Continue prozac -Thiamine, folic acid -correct Na, see  renal  PULMONARY OETT 9/06 >> 9/09 A: Acute respiratory failure in setting of ICH Bibasilar Atelectasis with mild hypoxemia Hx of COPD, tobacco abuse Resipratory alkalosis due to CNS issues No airway protection skills P:   - Oxygen to keep SpO2 > 92% - NPO - PRN albuterol - ETT now, then 8 cc/kg, rate 12, 100% peep 5-> abg to follow -stat pcxr  CARDIOVASCULAR aline 9/11 >> CVL 9/11 >> A:  Hx of CAD, hyperlipidemia, HTN, chronic systolic heart failure, AAA. P:  -Monitor hemodynamics  -Hold ASA, plavix in setting of ICH until okay with neurology/neurosurgery -Hold lopressor, imdur, lasix while getting HHH therapy -neo as needed for goal SBP 160-180 vs MAP 80-105 -nimodipine x 21 days -Continue simvastatin (uses pravachol as outpt)   RENAL A:   Hyponatremia Decline in Na with associated seizures - NA urine c/w CSW R/o siadh (as limited progres and na drop on nacl) P:   consider addition florinef Start 3% with associated clinical seizures, goal na rise 0.5 /hr , no more 12 in 24 hrs bmet to q2h Urine som output not c/w csw at this stage, but urine na apporaches  GASTROINTESTINAL A:   Protein calorie malnutrition Dysphagia Hx of IBS  P:   -Protonix for SUP -Panda tube for meds, nutrition on going  HEMATOLOGIC A:   Anemia of critical illness. P:  -F/u CBC  INFECTIOUS A:   Asp likely P:   -add levo ( pnc all) sputum  ENDOCRINE A:   Hyperglycemia   P:   -add ssi  TODAY'S SUMMARY: Seizures? Low na, ETT needed, asp, vent, 3% prognosis poor  CC time 30 minutes.  Carla Hoover. Carla Mould, MD, [place] Pgr: [facility]  If no response, call [phone]

## 2013-11-25 NOTE — Procedures (Signed)
Intubation Procedure Note REMMIE BEMBENEK [id number] 05-08-1950  Procedure: Intubation Indications: Respiratory insufficiency  Procedure Details Consent: Unable to obtain consent because of emergent medical necessity. Time Out: Verified patient identification, verified procedure, site/side was marked, verified correct patient position, special equipment/implants available, medications/allergies/relevent history reviewed, required imaging and test results available.  Performed  Maximum sterile technique was used including gown, hand hygiene and mask.  MAC and 3    Evaluation Hemodynamic Status: BP stable throughout; O2 sats: stable throughout Patient's Current Condition: stable Complications: No apparent complications Patient did tolerate procedure well. Chest X-ray ordered to verify placement.  CXR: pending.   Raylene Miyamoto. 11/25/2013  Airway full of pus, bile?  [name]. [name], MD, [place] Pgr: [facility]

## 2013-11-25 NOTE — Progress Notes (Signed)
STROKE TEAM PROGRESS NOTE   HISTORY  63 y.o. 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 She has surrounding cytotoxic edema and midline shift and is employed left MCA aneurysm as well.    SUBJECTIVE (INTERVAL HISTORY)  Pt was having left arm shaking episode overnight. depakote increased to 750mg  tid. However, I witness the left arm rhythmic shaking this am, so vimpat was loaded and continued with 50mg  bid. Her sodium low at 119, which more likely CNS salt wasting. She has central line, will discuss with CCM for 3% saline.  TCD done 11/21/13 again suboptimal with absent bitemporal windows.  OBJECTIVE  Filed Vitals:   11/25/13 1500 11/25/13 1545 11/25/13 1600 11/25/13 1652  BP: 149/102  116/60 116/60  Pulse:   29 80  Temp:  98.5 F (36.9 C)    TempSrc:  Axillary    Resp: 21  21 23   Height:      Weight:      SpO2: 100%  100% 100%    Recent Labs  Lab  11/23/2013 1900  11/23/2013 0245  11/20/13 0550  11/21/13 0500   NA  132*  128*  131*  131*   K  3.6*  3.7  3.2*  2.7*   CL  93*  92*  96  95*   CO2  23  20  22  22    GLUCOSE  142*  148*  109*  124*   BUN  10  11  10  9    CREATININE  0.99  0.98  0.82  0.72   CALCIUM  8.5  8.0*  7.2*  7.0*   MG  1.0*  --  --  --     Recent Labs  Lab  12/06/2013 1900  11/20/13 0550   AST  74*  49*   ALT  37*  24   ALKPHOS  122*  86   BILITOT  1.2  0.9   PROT  6.8  5.8*   ALBUMIN  3.3*  2.6*     Recent Labs  Lab  11/29/2013 2345  11/19/13 0245  11/20/13 0550  11/21/13 0500   WBC  --  7.1  8.0  7.8   NEUTROABS  --  --  6.2  --   HGB  --  11.1*  9.6*  9.9*   HCT  --  32.0*  28.0*  28.6*   MCV  --  100.3*  99.6  99.3   PLT  205  186  248  215     Recent Labs   12/12/2013 1900   LABPROT  12.5   INR  0.93      Component  Value  Date/Time    CHOL   118  05/18/2012 0512    TRIG  90  11/22/2013 1900    HDL  66  05/18/2012 0512    CHOLHDL  1.8  05/18/2012 0512    VLDL  14  05/18/2012 0512    LDLCALC  38  05/18/2012 0512    Ct Angio Head W/cm &/or Wo Cm  11/23/2013 1. Unchanged appearance of extensive, predominantly right-sided subarachnoid hemorrhage as well as 2.7 cm hematoma in the region of the right insula. Moderate surrounding edema with 5 mm of unchanged midline shift. 2. No right MCA aneurysm is identified, however there is severe attenuation of  the right MCA and its branch vessels which may reflect local mass effect and vasospasm secondary to surrounding hemorrhage, and this could obscure an underlying right MCA aneurysm. Narrowing of the ACAs and right ICA terminus also may reflect vasospasm. Catheter angiography may be helpful for further evaluation. 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  Dg Chest 1 View  11/21/2013 Improved aeration of the left lung base with mild basilar atelectasis remaining  11/20/2013 Slight increase in left basilar opacity most consistent with atelectasis.  11/13/2013 No significant change in mild opacity in the lateral left lung base.  11/24/2013 Endotracheal tube and nasogastric tube in appropriate position. Stable mild opacity in lateral left lung base.  Ct Head Wo Contrast  11/25/2013 Hemorrhagic right MCA infarct is stable. 6 mm midline shift unchanged. No new findings. 11/23/2013 1. Unchanged intracranial hemorrhage as above. 2. Unchanged edema in the right temporal, frontal, and parietal lobes, consistent with right MCA infarct. Similar degree of leftward midline shift. 11/21/2013 1. No significant interval change in intraparenchymal, subarachnoid, and subdural hemorrhage as compared to 11/20/13. Associated vasogenic edema is similar with stable 4-5 mm of right-to-left midline shift. No hydrocephalus. 2. No new hemorrhage.  11/20/2013 1. No change in the intracranial hemorrhage since the most  recent prior study. Right frontal parenchymal hematoma is stable in size as is the degree of mass effect. Subarachnoid hemorrhage and areas of subdural hemorrhage are stable. No new intracranial hemorrhage. 2. Mass effect and midline shift ir stable. 3. No hydrocephalus.  11/27/2013 Similar extensive right greater than left subarachnoid blood, with a trace possible posterior falcine subdural hematoma. Evolving 2.7 x 2.4 cm right insular hematoma with similar 5 mm right to left midline shift. No hydrocephalus.  TCD  11/27/2013 Limited study due to poor windows and neck strap limiting evaluation. Low normal mean flow velocities in identified vessels of anterior circulation. Globally elevated pulsatility indices suggest increase intracranial presure or diffuse intracranial atherosclerosis.    PHYSICAL EXAM  Filed Vitals:   11/25/13 1500 11/25/13 1545 11/25/13 1600 11/25/13 1652  BP: 149/102  116/60 116/60  Pulse:   29 80  Temp:  98.5 F (36.9 C)    TempSrc:  Axillary    Resp: 21  21 23   Height:      Weight:      SpO2: 100%  100% 100%    [name] intubated. Afebrile. Head is nontraumatic. Neck is supple without bruit. Cardiac exam no murmur or gallop. Lungs are coarse rhonchi bilaterally. Distal pulses are well felt.   Neurologic Examination:  unresponsive to voice, not following commands, but still responds to sternal rub and pain stimulation by localizing with right arm, can lift to barely against gravity with RLE on pain stimulation. LUE 0/5 and LLE 2/5 on pain stimulation. Pupils are equal and reactive. Fundi were not visualized. Right gaze preference. Right plantar is downgoing left is equivocal. Coordination and gait cannot be tested.  ASSESSMENT/PLAN  [name] is a 63 y.o. female with history of CHF, depression, elevated cholesterol, COPD, MI, and CAD on aspirin and plavix s/p fall where she hit her head. Presenting to [facility] 11/17/2013 with left sided  weakness, facial droop and headache. Intubated there and transferred to [facility]. Imaging confirms a right SAH, R IPH, with right MCA territory edema and hypodensity, concerning for right MCA stroke due to vasospasm. Developed partial seizure episodes, increased depakote and load with vimpat. Hyponatremia with likely CNS salt  wasting, on 3% saline.  Hemorrhage: R insular IPH with R perisylvian SAH & R SDH likely from R MCA aneurysm not seen on angio, now with new neurological deficits likely related to vasospasm with vasogenic edema and mass effect  aspirin 81 mg orally every day and clopidogrel 75 mg orally every day prior to admission, held now secondary to IPH Vasospasm confirmed on angiogram and treated with verapamil IA.  Concerning for right MCA stroke due to vasospasm Repeat CT stable lovenox for VTE prophylaxis  NPO  Bedrest  Resultant Left hemiplegia  Partial seizure - left UE shaking episodes - 2mg  ativan given -  depakote increased to 750mg  tid - vimpat 200mg  load and then 50mg  bid - depakote level in am  Hyponatremia - Na 119 - Urine Na 80 - likely salt wasting - on 3% saline - BMP q2h  Cerebral vasospasm - on nimodipine - s/p IA verapamil - triple H therapy - NSG on board  Hypertension  - part of the triple-H therapy now - on neo for desired hypertension - NSG on board - hemorrhage so far stable - goal 160-180  Hyperlipidemia  Home meds: pravachol. 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 Coronary artery disease - MI, angioplasty 2013 Ischemic cardiomyopathy  Other Pertinent History  AAA, infrarenal  R hepatic lobe lesion   Hospital day # 3   This patient is critically ill due to right MCA infarct due to seizure, vasospasm which is difficult to control,  right ICH with SAH due to aneurysm rupture, and at significant risk of neurological worsening, death and care requires constant monitoring of vital signs, hemodynamics,respiratory and cardiac monitoring,review of multiple databases, neurological assessment, discussion with family, other specialists and medical decision making of high complexity.I have made any additions or clarifications directly to the above note. I spent 45 minutes of neurocritical care time in the care of this patient.  Carla Hawking, MD PhD Stroke Neurology 11/25/2013 5:48 PM   To contact Stroke Continuity provider, please refer to [email or web address]  After hours, contact General Neurology

## 2013-11-25 NOTE — Progress Notes (Signed)
CRITICAL VALUE ALERT  Critical value received:  Serum osmolality 247  Date of notification:  11/25/2013   Time of notification:  3276   Critical value read back:Yes.    Nurse who received alert:  J. Lanny Donoso  MD notified (1st page):  Dr. [name]  Time of first page:  1510  MD notified (2nd page):  Time of second page:  Responding MD:  Dr. [name]  Time MD responded:  [phone]

## 2013-11-26 ENCOUNTER — Inpatient Hospital Stay (HOSPITAL_COMMUNITY): Payer: Medicare Other

## 2013-11-26 ENCOUNTER — Other Ambulatory Visit (HOSPITAL_COMMUNITY): Payer: Self-pay | Admitting: Neurosurgery

## 2013-11-26 DIAGNOSIS — R569 Unspecified convulsions: Secondary | ICD-10-CM

## 2013-11-26 LAB — GLUCOSE, CAPILLARY
GLUCOSE-CAPILLARY: 111 mg/dL — AB (ref 70–99)
GLUCOSE-CAPILLARY: 118 mg/dL — AB (ref 70–99)
Glucose-Capillary: 112 mg/dL — ABNORMAL HIGH (ref 70–99)
Glucose-Capillary: 115 mg/dL — ABNORMAL HIGH (ref 70–99)
Glucose-Capillary: 142 mg/dL — ABNORMAL HIGH (ref 70–99)
Glucose-Capillary: 151 mg/dL — ABNORMAL HIGH (ref 70–99)

## 2013-11-26 LAB — BASIC METABOLIC PANEL
Anion gap: 17 — ABNORMAL HIGH (ref 5–15)
Anion gap: 16 — ABNORMAL HIGH (ref 5–15)
Anion gap: 18 — ABNORMAL HIGH (ref 5–15)
Anion gap: 16 — ABNORMAL HIGH (ref 5–15)
Anion gap: 14 (ref 5–15)
Anion gap: 16 — ABNORMAL HIGH (ref 5–15)
BUN: 11 mg/dL (ref 6–23)
BUN: 10 mg/dL (ref 6–23)
BUN: 12 mg/dL (ref 6–23)
BUN: 9 mg/dL (ref 6–23)
BUN: 10 mg/dL (ref 6–23)
BUN: 11 mg/dL (ref 6–23)
CHLORIDE: 92 meq/L — AB (ref 96–112)
CHLORIDE: 97 meq/L (ref 96–112)
CHLORIDE: 97 meq/L (ref 96–112)
CO2: 18 mEq/L — ABNORMAL LOW (ref 19–32)
CO2: 18 mEq/L — ABNORMAL LOW (ref 19–32)
CO2: 18 meq/L — AB (ref 19–32)
CO2: 17 mEq/L — ABNORMAL LOW (ref 19–32)
CO2: 17 mEq/L — ABNORMAL LOW (ref 19–32)
CO2: 18 mEq/L — ABNORMAL LOW (ref 19–32)
CREATININE: 0.65 mg/dL (ref 0.50–1.10)
CREATININE: 0.66 mg/dL (ref 0.50–1.10)
CREATININE: 0.66 mg/dL (ref 0.50–1.10)
CREATININE: 0.67 mg/dL (ref 0.50–1.10)
CREATININE: 0.72 mg/dL (ref 0.50–1.10)
CREATININE: 0.74 mg/dL (ref 0.50–1.10)
Calcium: 7.8 mg/dL — ABNORMAL LOW (ref 8.4–10.5)
Calcium: 7.7 mg/dL — ABNORMAL LOW (ref 8.4–10.5)
Calcium: 8.1 mg/dL — ABNORMAL LOW (ref 8.4–10.5)
Calcium: 8.1 mg/dL — ABNORMAL LOW (ref 8.4–10.5)
Calcium: 8.2 mg/dL — ABNORMAL LOW (ref 8.4–10.5)
Calcium: 8.1 mg/dL — ABNORMAL LOW (ref 8.4–10.5)
Chloride: 90 mEq/L — ABNORMAL LOW (ref 96–112)
Chloride: 94 mEq/L — ABNORMAL LOW (ref 96–112)
Chloride: 97 mEq/L (ref 96–112)
GFR calc Af Amer: >90 mL/min (ref >90)
GFR calc Af Amer: >90 mL/min (ref >90)
GFR calc Af Amer: >90 mL/min (ref >90)
GFR calc non Af Amer: 87 mL/min — ABNORMAL LOW (ref >90)
GFR calc non Af Amer: 88 mL/min — ABNORMAL LOW (ref >90)
GFR calc non Af Amer: >90 mL/min (ref >90)
GFR calc non Af Amer: >90 mL/min (ref >90)
GFR calc non Af Amer: >90 mL/min (ref >90)
GLUCOSE: 124 mg/dL — AB (ref 70–99)
GLUCOSE: 113 mg/dL — AB (ref 70–99)
GLUCOSE: 140 mg/dL — AB (ref 70–99)
Glucose, Bld: 130 mg/dL — ABNORMAL HIGH (ref 70–99)
Glucose, Bld: 130 mg/dL — ABNORMAL HIGH (ref 70–99)
Glucose, Bld: 137 mg/dL — ABNORMAL HIGH (ref 70–99)
POTASSIUM: 3.2 meq/L — AB (ref 3.7–5.3)
POTASSIUM: 4.3 meq/L (ref 3.7–5.3)
POTASSIUM: 3.1 meq/L — AB (ref 3.7–5.3)
Potassium: 3.4 mEq/L — ABNORMAL LOW (ref 3.7–5.3)
Potassium: 3 mEq/L — ABNORMAL LOW (ref 3.7–5.3)
Potassium: 3.5 mEq/L — ABNORMAL LOW (ref 3.7–5.3)
Sodium: 125 mEq/L — ABNORMAL LOW (ref 137–147)
Sodium: 126 mEq/L — ABNORMAL LOW (ref 137–147)
Sodium: 129 mEq/L — ABNORMAL LOW (ref 137–147)
Sodium: 130 mEq/L — ABNORMAL LOW (ref 137–147)
Sodium: 129 mEq/L — ABNORMAL LOW (ref 137–147)
Sodium: 131 mEq/L — ABNORMAL LOW (ref 137–147)

## 2013-11-26 LAB — COMPREHENSIVE METABOLIC PANEL
ALT: 48 U/L — AB (ref 0–35)
AST: 86 U/L — ABNORMAL HIGH (ref 0–37)
Albumin: 2.2 g/dL — ABNORMAL LOW (ref 3.5–5.2)
Alkaline Phosphatase: 88 U/L (ref 39–117)
Anion gap: 17 — ABNORMAL HIGH (ref 5–15)
BUN: 11 mg/dL (ref 6–23)
CALCIUM: 7.7 mg/dL — AB (ref 8.4–10.5)
CO2: 17 meq/L — AB (ref 19–32)
CREATININE: 0.71 mg/dL (ref 0.50–1.10)
Chloride: 92 mEq/L — ABNORMAL LOW (ref 96–112)
GFR, EST NON AFRICAN AMERICAN: 89 mL/min — AB (ref >90)
GLUCOSE: 159 mg/dL — AB (ref 70–99)
Potassium: 3 mEq/L — ABNORMAL LOW (ref 3.7–5.3)
Sodium: 126 mEq/L — ABNORMAL LOW (ref 137–147)
Total Bilirubin: 0.3 mg/dL (ref 0.3–1.2)
Total Protein: 5.6 g/dL — ABNORMAL LOW (ref 6.0–8.3)

## 2013-11-26 LAB — VALPROIC ACID LEVEL: Valproic Acid Lvl: 85.6 ug/mL (ref 50.0–100.0)

## 2013-11-26 LAB — CBC WITH DIFFERENTIAL/PLATELET
BASOS ABS: 0 10*3/uL (ref 0.0–0.1)
Basophils Relative: 0 % (ref 0–1)
EOS ABS: 0 10*3/uL (ref 0.0–0.7)
Eosinophils Relative: 0 % (ref 0–5)
HCT: 30.1 % — ABNORMAL LOW (ref 36.0–46.0)
Hemoglobin: 10.8 g/dL — ABNORMAL LOW (ref 12.0–15.0)
Lymphocytes Relative: 9 % — ABNORMAL LOW (ref 12–46)
Lymphs Abs: 1.1 10*3/uL (ref 0.7–4.0)
MCH: 34.3 pg — AB (ref 26.0–34.0)
MCHC: 35.9 g/dL (ref 30.0–36.0)
MCV: 95.6 fL (ref 78.0–100.0)
Monocytes Absolute: 2 10*3/uL — ABNORMAL HIGH (ref 0.1–1.0)
Monocytes Relative: 15 % — ABNORMAL HIGH (ref 3–12)
NEUTROS PCT: 76 % (ref 43–77)
Neutro Abs: 9.9 10*3/uL — ABNORMAL HIGH (ref 1.7–7.7)
PLATELETS: 280 10*3/uL (ref 150–400)
RBC: 3.15 MIL/uL — ABNORMAL LOW (ref 3.87–5.11)
RDW: 12.9 % (ref 11.5–15.5)
WBC: 13.1 10*3/uL — AB (ref 4.0–10.5)

## 2013-11-26 LAB — CLOSTRIDIUM DIFFICILE BY PCR: Toxigenic C. Difficile by PCR: NEGATIVE

## 2013-11-26 LAB — MAGNESIUM: MAGNESIUM: 2.7 mg/dL — AB (ref 1.5–2.5)

## 2013-11-26 MED ORDER — LACOSAMIDE 200 MG/20ML IV SOLN
100.0000 mg | Freq: Once | INTRAVENOUS | Status: AC
  Administered 2013-11-26: 100 mg via INTRAVENOUS
  Filled 2013-11-26: qty 10

## 2013-11-26 MED ORDER — PRO-STAT SUGAR FREE PO LIQD
30.0000 mL | Freq: Three times a day (TID) | ORAL | Status: DC
  Administered 2013-11-26 – 2013-11-30 (×12): 30 mL
  Administered 2013-11-30: 17:00:00
  Administered 2013-11-30 – 2013-12-06 (×19): 30 mL
  Filled 2013-11-26 (×35): qty 30

## 2013-11-26 MED ORDER — OSMOLITE 1.2 CAL PO LIQD
1000.0000 mL | ORAL | Status: DC
  Administered 2013-11-26 – 2013-12-06 (×10): 1000 mL
  Filled 2013-11-26 (×13): qty 1000

## 2013-11-26 MED ORDER — POTASSIUM CHLORIDE 20 MEQ/15ML (10%) PO LIQD
30.0000 meq | ORAL | Status: AC
  Administered 2013-11-26 (×2): 30 meq
  Filled 2013-11-26 (×2): qty 30

## 2013-11-26 MED ORDER — PHENYLEPHRINE HCL 10 MG/ML IJ SOLN
30.0000 ug/min | INTRAMUSCULAR | Status: DC
  Administered 2013-11-26: 110 ug/min via INTRAVENOUS
  Administered 2013-11-26: 113 ug/min via INTRAVENOUS
  Administered 2013-11-26: 70 ug/min via INTRAVENOUS
  Administered 2013-11-26: 110 ug/min via INTRAVENOUS
  Administered 2013-11-27: 15 ug/min via INTRAVENOUS
  Administered 2013-11-27: 100 ug/min via INTRAVENOUS
  Administered 2013-11-27: 30 ug/min via INTRAVENOUS
  Administered 2013-11-28: 100 ug/min via INTRAVENOUS
  Administered 2013-11-28: 90 ug/min via INTRAVENOUS
  Administered 2013-11-28: 100 ug/min via INTRAVENOUS
  Administered 2013-11-29: 65 ug/min via INTRAVENOUS
  Administered 2013-11-29: 80 ug/min via INTRAVENOUS
  Administered 2013-11-30: 85 ug/min via INTRAVENOUS
  Administered 2013-11-30: 110 ug/min via INTRAVENOUS
  Administered 2013-11-30: 130 ug/min via INTRAVENOUS
  Administered 2013-12-01: 110 ug/min via INTRAVENOUS
  Administered 2013-12-01: 90 ug/min via INTRAVENOUS
  Administered 2013-12-01: 110 ug/min via INTRAVENOUS
  Administered 2013-12-02: 120 ug/min via INTRAVENOUS
  Filled 2013-11-26 (×20): qty 4

## 2013-11-26 MED ORDER — NIMODIPINE 60 MG/20ML PO SOLN
60.0000 mg | ORAL | Status: DC
  Administered 2013-11-26 – 2013-12-07 (×66): 60 mg via ORAL
  Filled 2013-11-26 (×73): qty 20

## 2013-11-26 NOTE — Progress Notes (Signed)
SLP Cancellation Note  Patient Details Name: Carla Hoover MRN: [id number] DOB: 02-Dec-1950   Cancelled treatment:       Reason Eval/Treat Not Completed: Medical issues which prohibited therapy. Pt now intubated. Will sign off and await new orders.    Zubin Pontillo, Sakai Heinle 11/26/2013, 8:14 AM

## 2013-11-26 NOTE — Progress Notes (Signed)
OT Cancellation Note  Patient Details Name: NYJA WESTBROOK MRN: [id number] DOB: 10-02-1950   Cancelled Treatment:    Reason Eval/Treat Not Completed: Patient not medically ready (on vent)  [name] Pager: [phone]  11/26/2013, 7:49 AM

## 2013-11-26 NOTE — Progress Notes (Signed)
UR completed.  Await medical stability and extubation to make further d/c plans. Continuing to follow.   Sandi Mariscal, RN BSN [place] CCM Trauma/Neuro ICU Case Manager [phone]

## 2013-11-26 NOTE — Progress Notes (Addendum)
NUTRITION FOLLOW-UP  INTERVENTION:  Decrease Osmolite 1.2 via NG tube to goal rate of 40 ml/hr.  Add 30 ml Prostat TID   Tube feeding regimen provides 1452 kcal (98% of needs), 98 grams of protein, and 787 ml of H2O.   NUTRITION DIAGNOSIS: Inadequate oral intake related to inability to eat as evidenced by NPO status; ongoing.   Goal: Pt to meet >/= 90% of their estimated nutrition needs; not met.    Monitor:  TF tolerance, weight trend, diet advancement  ASSESSMENT: Pt fell one day prior to admission, presented with left sided weakness and facial drop. Per CT pt has ICH with SAH.  Pt extubated 9/9 and failed swallow evaluation   Pt with hx of ETOH currently on thiamine and folic acid, also hx of COPD and tobacco abuse. Per notes hx of IBS.  Sodium low  Potassium low  Patient is currently intubated on ventilator support MV: 9.6 L/min Temp (24hrs), Avg:98.7 F (37.1 C), Min:97.7 F (36.5 C), Max:99.2 F (37.3 C)  NGT: tip in stomach Per records pt vomited x 1 9/12 at 4 am. TF stopped but resumed 9/13 pm. Hyponatremia with associated seizures, urine sodium c/w CSW. Pt now on 3%.  Pt re-intubated 9/13. Pt with 35m shift.  Spoke with RN, today's plan is to increase TF back to goal rate.   Height: Ht Readings from Last 1 Encounters:  12/03/2013 5' 4"  (1.626 m)    Weight: Wt Readings from Last 1 Encounters:  11/26/13 156 lb 15.5 oz (71.2 kg)  Admission weight 149 lb (67.8 kg) 9/10  BMI:  Body mass index is 26.93 kg/(m^2).  Estimated Nutritional Needs: Kcal: 1478 Protein: 80-95 grams Fluid: > 1.6 L/day  Skin: groin incision  Diet Order: NPO   Intake/Output Summary (Last 24 hours) at 11/26/13 1450 Last data filed at 11/26/13 1300  Gross per 24 hour  Intake 2088.2 ml  Output   2125 ml  Net  -36.8 ml    Last BM: 9/9   Labs:   Recent Labs Lab 11/23/13 0230 11/24/13 0438 11/25/13 0600  11/26/13 0301 11/26/13 0435 11/26/13 1000  NA 130* 124* 119*  < >  126* 126* 129*  K 3.1* 3.3* 3.6*  < > 3.0* 3.0* 3.2*  CL 92* 88* 84*  < > 92* 92* 94*  CO2 23 21 18*  < > 18* 17* 17*  BUN 7 8 9   < > 11 11 10   CREATININE 0.61 0.60 0.55  < > 0.72 0.71 0.67  CALCIUM 7.3* 7.3* 8.1*  < > 7.7* 7.7* 8.1*  MG  --  0.9* 1.3*  --   --  2.7*  --   PHOS  --  1.8*  --   --   --   --   --   GLUCOSE 138* 141* 130*  < > 124* 159* 113*  < > = values in this interval not displayed.  CBG (last 3)   Recent Labs  11/26/13 0256 11/26/13 0814 11/26/13 1126  GLUCAP 112* 142* 115*    Scheduled Meds: . antiseptic oral rinse  7 mL Mouth Rinse QID  . chlorhexidine  15 mL Mouth Rinse BID  . enoxaparin (LOVENOX) injection  40 mg Subcutaneous Q24H  . fludrocortisone  0.1 mg Per Tube Daily  . folic acid  1 mg Per Tube Daily  . insulin aspart  0-9 Units Subcutaneous 6 times per day  . lacosamide (VIMPAT) IV  100 mg Intravenous Q12H  . levofloxacin (LEVAQUIN)  IV  750 mg Intravenous Q24H  . metoCLOPramide (REGLAN) injection  5 mg Intravenous 4 times per day  . NiMODipine  60 mg Oral Q4H  . pantoprazole sodium  40 mg Per Tube Q24H  . senna-docusate  1 tablet Oral BID  . simvastatin  40 mg Per Tube q1800  . thiamine  100 mg Per Tube Daily  . valproate sodium  750 mg Intravenous 3 times per day    Continuous Infusions: . sodium chloride 10 mL/hr at 11/25/13 1448  . feeding supplement (OSMOLITE 1.2 CAL) 1,000 mL (11/25/13 1634)  . phenylephrine (NEO-SYNEPHRINE) Adult infusion 113 mcg/min (11/26/13 1317)  . sodium chloride (hypertonic) 35 mL/hr (11/26/13 0906)     [place], [place], [place] Pager [phone] After Hours Pager

## 2013-11-26 NOTE — Progress Notes (Signed)
Continue ventilatory and BP support.

## 2013-11-26 NOTE — Progress Notes (Signed)
LTVM up and running   

## 2013-11-26 NOTE — Progress Notes (Signed)
PT Cancellation Note  Patient Details Name: Carla Hoover MRN: [id number] DOB: Mar 10, 1951   Cancelled Treatment:    Reason Eval/Treat Not Completed: Medical issues which prohibited therapy (per nsg sign off, request new orders). Patient with medical decline, now ventilated. Will await new orders when medically appropriate.   Duncan Dull 11/26/2013, 8:31 AM Alben Deeds, PT DPT  [phone]

## 2013-11-26 NOTE — Progress Notes (Signed)
VASCULAR LAB PRELIMINARY  PRELIMINARY  PRELIMINARY  PRELIMINARY  Transcranial Doppler  Date POD PCO2 HCT BP  MCA ACA PCA OPHT SIPH VERT Basilar  11/18/2013 SB     Right  Left   40  26   --  -30   --  --   21  18   30  31    --  --     --    11-21-13 hc     Right  Left   -  -   -  -   -  -   30  23   -39  56   -44  -47   -45      11-23-13 fgn     Right  Left   -  -   -  -   -  -   22  24   35  24   31  27           11-26-13 hc     Right  Left   -  -   -  -   -  -   29  21   35  64   ####  -51     -50          Right  Left                                            Right  Left                                            Right  Left                                        MCA = Middle Cerebral Artery      OPHT = Opthalmic Artery     BASILAR = Basilar Artery   ACA = Anterior Cerebral Artery     SIPH = Carotid Siphon PCA = Posterior Cerebral Artery   VERT = Verterbral Artery                   Normal MCA = 62+\-12 ACA = 50+\-12 PCA = 42+\-23   ####continuous EEG dressings and patient position--unable to obtain right vertebral  Fateh Kindle, RVT 11/26/2013, 3:27 PM

## 2013-11-26 NOTE — Progress Notes (Addendum)
Subjective: Patient reports (vent support)  Objective: Vital signs in last 24 hours: Temp:  [97.7 F (36.5 C)-99.2 F (37.3 C)] 97.7 F (36.5 C) (09/14 0300) Pulse Rate:  [25-134] 71 (09/14 0715) Resp:  [17-41] 21 (09/14 0715) BP: (116-184)/(42-102) 160/57 mmHg (09/14 0715) SpO2:  [88 %-100 %] 100 % (09/14 0715) Arterial Line BP: (83-195)/(48-92) 158/60 mmHg (09/14 0715) FiO2 (%):  [30 %-40 %] 30 % (09/14 0422) Weight:  [71.2 kg (156 lb 15.5 oz)] 71.2 kg (156 lb 15.5 oz) (09/14 0630)  Intake/Output from previous day: 09/13 0701 - 09/14 0700 In: 2400.4 [I.V.:1687.6; NG/GT:310.3; IV Piggyback:402.5] Out: 1700 [Urine:1700] Intake/Output this shift:    Now intubated d/t decreased responsiveness yesterday with poor airway. Withdraws from touch right foot, left index & middle finger movement again noted with stimulation. PEARL at 63mm.    Lab Results:  Recent Labs  11/25/13 0600 11/26/13 0435  WBC 10.3 13.1*  HGB 12.0 10.8*  HCT 32.6* 30.1*  PLT 227 280   BMET  Recent Labs  11/26/13 0301 11/26/13 0435  NA 126* 126*  K 3.0* 3.0*  CL 92* 92*  CO2 18* 17*  GLUCOSE 124* 159*  BUN 11 11  CREATININE 0.72 0.71  CALCIUM 7.7* 7.7*    Studies/Results: Ct Head Wo Contrast  11/25/2013   CLINICAL DATA:  Intracranial hemorrhage  EXAM: CT HEAD WITHOUT CONTRAST  TECHNIQUE: Contiguous axial images were obtained from the base of the skull through the vertex without intravenous contrast.  COMPARISON:  CT head 11/23/2013  FINDINGS: Lobar infarction right MCA territory with edema in the right lateral basal ganglia and throughout the right MCA territory. Hemorrhagic transformation of the infarct is noted and unchanged. Hematoma in the infarct is stable. Subarachnoid hemorrhage on the right is stable. Small amount of interhemispheric subdural hemorrhage also stable.  Ventricle size is normal. There is mass-effect on the right lateral ventricle with 6 mm midline shift which is approximately  the same.  No new area of hemorrhage or infarction compared with the prior study.  IMPRESSION: Hemorrhagic right MCA infarct is stable. 6 mm midline shift unchanged. No new findings.   Electronically Signed   By: Franchot Gallo M.D.   On: 11/25/2013 10:31   Portable Chest Xray  11/25/2013   CLINICAL DATA:  Endotracheal tube placement  EXAM: PORTABLE CHEST - 1 VIEW  COMPARISON:  11/24/2013  FINDINGS: ET tube tip is above the carina. There is a left IJ catheter with tip in the projection the SVC. A feeding tube is identified with tip below the field of view. Heart size is mildly enlarged. There are small pleural effusions. Bibasilar atelectasis of is again noted and appears improved from previous exam  IMPRESSION: 1. ETT tip is above the carina. 2. Improved bibasilar atelectasis.   Electronically Signed   By: Kerby Moors M.D.   On: 11/25/2013 15:28    Assessment/Plan:   LOS: 8 days  Continue support.   Verdis Prime 11/26/2013, 7:58 AM    Patient currently unresponsive.  Seizure activity noted.  On 3 %  NaCl for Na 126.  Patient remains critically ill.  Continue support.

## 2013-11-26 NOTE — Progress Notes (Signed)
STROKE TEAM PROGRESS NOTE   HISTORY  63 y.o. 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 She has surrounding cytotoxic edema and midline shift and is employed left MCA aneurysm as well.    SUBJECTIVE (INTERVAL HISTORY)  Pt was given vimpat for seizure yesterday. 3% started for hyponatremia. Intubated for airway protection. Was told by RN that pt still had intermittent left forearm shaking with suctioning. vimpat increased to 100mg  bid. Na this am 126.   OBJECTIVE  Filed Vitals:   11/26/13 1115 11/26/13 1130 11/26/13 1145 11/26/13 1201  BP: 154/62 152/66 162/59 184/67  Pulse: 25 73 71   Temp:      TempSrc:      Resp: 26 26 25 23   Height:      Weight:      SpO2: 100% 100% 100%     Recent Labs  Lab  12/07/2013 1900  11/14/2013 0245  11/20/13 0550  11/21/13 0500   NA  132*  128*  131*  131*   K  3.6*  3.7  3.2*  2.7*   CL  93*  92*  96  95*   CO2  23  20  22  22    GLUCOSE  142*  148*  109*  124*   BUN  10  11  10  9    CREATININE  0.99  0.98  0.82  0.72   CALCIUM  8.5  8.0*  7.2*  7.0*   MG  1.0*  --  --  --     Recent Labs  Lab  12/06/2013 1900  11/20/13 0550   AST  74*  49*   ALT  37*  24   ALKPHOS  122*  86   BILITOT  1.2  0.9   PROT  6.8  5.8*   ALBUMIN  3.3*  2.6*     Recent Labs  Lab  11/17/2013 2345  11/18/2013 0245  11/20/13 0550  11/21/13 0500   WBC  --  7.1  8.0  7.8   NEUTROABS  --  --  6.2  --   HGB  --  11.1*  9.6*  9.9*   HCT  --  32.0*  28.0*  28.6*   MCV  --  100.3*  99.6  99.3   PLT  205  186  248  215     Recent Labs   12/09/2013 1900   LABPROT  12.5   INR  0.93      Component  Value  Date/Time    CHOL  118  05/18/2012 0512    TRIG  90  11/26/2013 1900    HDL  66  05/18/2012 0512    CHOLHDL  1.8  05/18/2012 0512    VLDL  14  05/18/2012 0512    LDLCALC  38   05/18/2012 0512    Ct Angio Head W/cm &/or Wo Cm  11/28/2013 1. Unchanged appearance of extensive, predominantly right-sided subarachnoid hemorrhage as well as 2.7 cm hematoma in the region of the right insula. Moderate surrounding edema with 5 mm of unchanged midline shift. 2. No right MCA aneurysm is identified, however there is severe attenuation of the right MCA and its branch vessels which may reflect local mass effect and vasospasm secondary to surrounding hemorrhage, and this could obscure an underlying right MCA aneurysm.  Narrowing of the ACAs and right ICA terminus also may reflect vasospasm. Catheter angiography may be helpful for further evaluation. 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  Dg Chest 1 View  11/21/2013 Improved aeration of the left lung base with mild basilar atelectasis remaining  11/20/2013 Slight increase in left basilar opacity most consistent with atelectasis.  11/16/2013 No significant change in mild opacity in the lateral left lung base.  12/09/2013 Endotracheal tube and nasogastric tube in appropriate position. Stable mild opacity in lateral left lung base.  Ct Head Wo Contrast  11/25/2013 Hemorrhagic right MCA infarct is stable. 6 mm midline shift unchanged. No new findings. 11/23/2013 1. Unchanged intracranial hemorrhage as above. 2. Unchanged edema in the right temporal, frontal, and parietal lobes, consistent with right MCA infarct. Similar degree of leftward midline shift. 11/21/2013 1. No significant interval change in intraparenchymal, subarachnoid, and subdural hemorrhage as compared to 11/20/13. Associated vasogenic edema is similar with stable 4-5 mm of right-to-left midline shift. No hydrocephalus. 2. No new hemorrhage.  11/20/2013 1. No change in the intracranial hemorrhage since the most recent prior study. Right frontal parenchymal hematoma is stable in size as is the degree of mass effect. Subarachnoid hemorrhage and areas of subdural  hemorrhage are stable. No new intracranial hemorrhage. 2. Mass effect and midline shift ir stable. 3. No hydrocephalus.  11/21/2013 Similar extensive right greater than left subarachnoid blood, with a trace possible posterior falcine subdural hematoma. Evolving 2.7 x 2.4 cm right insular hematoma with similar 5 mm right to left midline shift. No hydrocephalus.  TCD  11/30/2013 Limited study due to poor windows and neck strap limiting evaluation. Low normal mean flow velocities in identified vessels of anterior circulation. Globally elevated pulsatility indices suggest increase intracranial presure or diffuse intracranial atherosclerosis.    PHYSICAL EXAM  Filed Vitals:   11/26/13 1115 11/26/13 1130 11/26/13 1145 11/26/13 1201  BP: 154/62 152/66 162/59 184/67  Pulse: 25 73 71   Temp:      TempSrc:      Resp: 26 26 25 23   Height:      Weight:      SpO2: 100% 100% 100%     [name] intubated. Afebrile. Head is nontraumatic. Neck is supple without bruit. Cardiac exam no murmur or gallop. Lungs are coarse rhonchi bilaterally. Distal pulses are well felt.   Neurologic Examination:  Intubated and not on sedation. Unresponsive to voice, not following commands, but still responds to sternal rub and pain stimulation by localizing with right arm, can lift to barely against gravity with RLE on pain stimulation. LUE 0/5 and LLE 2/5 on pain stimulation. Pupils are equal and reactive. Fundi were not visualized. Right gaze preference. Right plantar is downgoing left is equivocal. Coordination and gait cannot be tested.  ASSESSMENT/PLAN  Carla Hoover is a 63 y.o. female with history of CHF, depression, elevated cholesterol, COPD, MI, and CAD on aspirin and plavix s/p fall where she hit her head. Presenting to [facility] 12/04/2013 with left sided weakness, facial droop and headache. Intubated there and transferred to [facility]. Imaging confirms a right SAH, R IPH, with right MCA  territory edema and hypodensity, concerning for right MCA stroke due to vasospasm. Developed partial seizure episodes, increased depakote and load with vimpat. Hyponatremia with likely CNS salt wasting, on 3% saline. Intubated due to airway protection. Na up to 126 today from 119 yesterday.  Hemorrhage: R insular IPH with R perisylvian SAH &  R SDH likely from R MCA aneurysm not seen on angio, now with new neurological deficits likely related to vasospasm with vasogenic edema and mass effect  aspirin 81 mg orally every day and clopidogrel 75 mg orally every day prior to admission, held now secondary to IPH Vasospasm confirmed on angiogram and treated with verapamil IA.  Concerning for right MCA stroke due to vasospasm Repeat CT stable lovenox for VTE prophylaxis  NPO  Bedrest  Resultant Left hemiplegia  Respiratory failure - intubated on no sedation - vent per CCM  Partial seizure - left UE shaking episodes, still intermittent overnight - depakote increased to 750mg  tid - increase vimpat maintenance dose to 100mg  bid - depakote level 85.6 today - LTM EEG to rule out NCSE  Hyponatremia - Na 119 -> 126  - Urine Na 80 - likely salt wasting - on 3% saline increased to 35cc/h - BMP q2h  Cerebral vasospasm - on nimodipine - s/p IA verapamil - triple H therapy - NSG on board  Hypertension  - part of the triple-H therapy now - on neo for desired hypertension - NSG on board - hemorrhage so far stable - goal 160-180  Hyperlipidemia  Home meds: pravachol. 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 Coronary artery disease - MI, angioplasty 2013 Ischemic cardiomyopathy  Other Pertinent History  AAA, infrarenal  R hepatic lobe lesion   Hospital day # 3   This patient is  critically ill due to right MCA infarct due to seizure, vasospasm which is difficult to control, right ICH with SAH due to aneurysm rupture, and at significant risk of neurological worsening, death and care requires constant monitoring of vital signs, hemodynamics,respiratory and cardiac monitoring,review of multiple databases, neurological assessment, discussion with family, other specialists and medical decision making of high complexity.I have made any additions or clarifications directly to the above note. I spent 40 minutes of neurocritical care time in the care of this patient.  Carla Hawking, MD PhD Stroke Neurology 11/26/2013 1:19 PM   To contact Stroke Continuity provider, please refer to [email or web address]  After hours, contact General Neurology

## 2013-11-26 NOTE — Progress Notes (Signed)
~  0200: Pt turned, cleaned, and repositioned. Noted left arm/hand twitching like movement. 2mg  Ativan given. Event lasted ~45mins.  0207:Dr. Doy Mince notified. Discussed how pt would began to have this activity while being stimulated (mouth care, turn/reposition, neuro assessment). Discussed course of care about pt, medications, labs, other notes; etc. Continue to monitor pt and notify MD with any other changes, especially when twitching movement when not stimulated.

## 2013-11-26 NOTE — Progress Notes (Signed)
PULMONARY / CRITICAL CARE MEDICINE   Name: Carla Hoover MRN: [id number] DOB: May 25, 1950    ADMISSION DATE:  12/05/2013 CONSULTATION DATE:  11/18/2013  REFERRING MD :  Erline Levine  CHIEF COMPLAINT:  Weakness  INITIAL PRESENTATION:  63 yo female fell and hit her head one day prior to admit.  Presented to [facility] with Lt sided weakness and facial drop.  CT head showed ICH with SAH.  She required intubation prior to transfer to [facility].  PCCM consulted for vent/medical management.  STUDIES:  9/06 CT head ([place]) 9/07 Cerebral arteriogram >> 7 mm Lt MCA Aneurysm, vasospasm of Rt MCA 9/09 CT head >> no change in ICH, SAH, SDH 9/11 CT head >> unchanged ICH or MCA infarct changes 9/13 head CT- 6 mm shift, no change in ICH  SIGNIFICANT EVENTS: 9/06 Transfer from [place] to [facility] 9/11 More lethargic, Lt side weaker >> start [facility] therapy  9/13 ETT placed, hyponatemia  SUBJECTIVE:  Afebrile Not on sedative gtt Good UO   VITAL SIGNS: Temp:  [97.7 F (36.5 C)-99.2 F (37.3 C)] 98.7 F (37.1 C) (09/14 0800) Pulse Rate:  [25-134] 69 (09/14 0928) Resp:  [17-38] 25 (09/14 0928) BP: (115-185)/(42-102) 185/63 mmHg (09/14 0928) SpO2:  [88 %-100 %] 100 % (09/14 0915) Arterial Line BP: (83-198)/(48-92) 198/70 mmHg (09/14 0915) FiO2 (%):  [30 %-40 %] 30 % (09/14 0928) Weight:  [71.2 kg (156 lb 15.5 oz)] 71.2 kg (156 lb 15.5 oz) (09/14 0630) INTAKE / OUTPUT:  Intake/Output Summary (Last 24 hours) at 11/26/13 1031 Last data filed at 11/26/13 0900  Gross per 24 hour  Intake 2212.47 ml  Output   1800 ml  Net 412.47 ml    PHYSICAL EXAMINATION:  Gen: acutely ill, intubated HEENT NG tube PULM:clear BL CV: RRR, no mgr AB: BS+, soft, nontender Ext: warm no edema Neuro:moves extremitties to  pain, pupils dilated reactive BL, lt plantar upgoing, gave a 'thumbs up' on rt hand  LABS:  CBC  Recent Labs Lab 11/24/13 0438 11/25/13 0600 11/26/13 0435  WBC 9.1 10.3 13.1*   HGB 12.3 12.0 10.8*  HCT 33.7* 32.6* 30.1*  PLT 279 227 280   Coag's No results found for this basename: APTT, INR,  in the last 168 hours BMET  Recent Labs Lab 11/26/13 0046 11/26/13 0301 11/26/13 0435  NA 125* 126* 126*  K 3.4* 3.0* 3.0*  CL 90* 92* 92*  CO2 18* 18* 17*  BUN 11 11 11   CREATININE 0.74 0.72 0.71  GLUCOSE 130* 124* 159*   Electrolytes  Recent Labs Lab 11/24/13 0438 11/25/13 0600  11/26/13 0046 11/26/13 0301 11/26/13 0435  CALCIUM 7.3* 8.1*  < > 7.8* 7.7* 7.7*  MG 0.9* 1.3*  --   --   --  2.7*  PHOS 1.8*  --   --   --   --   --   < > = values in this interval not displayed.  ABG  Recent Labs Lab 11/24/13 0528 11/25/13 1540  PHART 7.517* 7.458*  PCO2ART 28.6* 26.6*  PO2ART 61.1* 130.0*   Liver Enzymes  Recent Labs Lab 11/20/13 0550 11/26/13 0435  AST 49* 86*  ALT 24 48*  ALKPHOS 86 88  BILITOT 0.9 0.3  ALBUMIN 2.6* 2.2*   Imaging Ct Head Wo Contrast  11/25/2013   CLINICAL DATA:  Intracranial hemorrhage  EXAM: CT HEAD WITHOUT CONTRAST  TECHNIQUE: Contiguous axial images were obtained from the base of the skull through the vertex without intravenous contrast.  COMPARISON:  CT head 11/23/2013  FINDINGS: Lobar infarction right MCA territory with edema in the right lateral basal ganglia and throughout the right MCA territory. Hemorrhagic transformation of the infarct is noted and unchanged. Hematoma in the infarct is stable. Subarachnoid hemorrhage on the right is stable. Small amount of interhemispheric subdural hemorrhage also stable.  Ventricle size is normal. There is mass-effect on the right lateral ventricle with 6 mm midline shift which is approximately the same.  No new area of hemorrhage or infarction compared with the prior study.  IMPRESSION: Hemorrhagic right MCA infarct is stable. 6 mm midline shift unchanged. No new findings.   Electronically Signed   By: Franchot Gallo M.D.   On: 11/25/2013 10:31   Portable Chest Xray  11/25/2013    CLINICAL DATA:  Endotracheal tube placement  EXAM: PORTABLE CHEST - 1 VIEW  COMPARISON:  11/24/2013  FINDINGS: ET tube tip is above the carina. There is a left IJ catheter with tip in the projection the SVC. A feeding tube is identified with tip below the field of view. Heart size is mildly enlarged. There are small pleural effusions. Bibasilar atelectasis of is again noted and appears improved from previous exam  IMPRESSION: 1. ETT tip is above the carina. 2. Improved bibasilar atelectasis.   Electronically Signed   By: Kerby Moors M.D.   On: 11/25/2013 15:28     ASSESSMENT / PLAN: NEUROLOGIC A:   ICH with SAH with concern for vasospasm/MCA stroke 9/11 Hx of anxiety, depression, insomnia  Hx of ETOH abuse Seizure likley P:   -Valproate for seizure prevention per neurology -consider eeg continuous if recurrent seizure -Nimotop per neurosurgery -Continue simvastatin -Continue HHH therapy >> goal SBP 160 to 180 with neo gtt -need cvp to goal 10-12 -F/u TCD's -Continue prozac -Thiamine, folic acid   PULMONARY OETT 9/06 >> 9/09 A: Acute respiratory failure in setting of ICH Bibasilar Atelectasis with mild hypoxemia Hx of COPD, tobacco abuse Resipratory alkalosis due to CNS issues No airway protection skills P:   - Oxygen to keep SpO2 > 92% - PRN albuterol -SBTs but no extubation until vasospasm resolved  CARDIOVASCULAR aline 9/11 >> CVL 9/11 >> A:  Hx of CAD, hyperlipidemia, HTN, chronic systolic heart failure, AAA. P:  -Hold ASA, plavix in setting of ICH until okay with neurology/neurosurgery -Hold lopressor, imdur, lasix while getting HHH therapy -neo as needed for goal SBP 160-180 vs MAP 80-105 -nimodipine x 21 days -Continue simvastatin (uses pravachol as outpt)   RENAL A:   Hyponatremia Decline in Na with associated seizures - NA urine c/w CSW R/o siadh (as limited progres and na drop on nacl) P:   consider addition florinef Ct 3% with associated clinical  seizures, goal na rise 0.5 /hr , no more 12 in 24 hrs bmet to q4h output not c/w csw at this stage, but urine na apporaches  GASTROINTESTINAL A:   Protein calorie malnutrition Dysphagia Hx of IBS  P:   -Protonix for SUP -Panda tube for meds, TFs  HEMATOLOGIC A:   Anemia of critical illness. P:  -F/u CBC  INFECTIOUS A:   Asp likely P:   -ct  levo ( pcn all) sputum  ENDOCRINE A:   Hyperglycemia   P:   -add ssi  TODAY'S [place] therapy for vasospasm post SAH, prognosis guarded   Care during the described time interval was provided by me and/or other providers on the critical care team.  I have reviewed this patient's available data,  including medical history, events of note, physical examination and test results as part of my evaluation  CC time x 58m  Kara Mead MD. FCCP. [facility] Pager [phone] If no response call [phone]

## 2013-11-27 ENCOUNTER — Inpatient Hospital Stay (HOSPITAL_COMMUNITY): Payer: Medicare Other

## 2013-11-27 DIAGNOSIS — E785 Hyperlipidemia, unspecified: Secondary | ICD-10-CM

## 2013-11-27 LAB — BASIC METABOLIC PANEL
ANION GAP: 15 (ref 5–15)
ANION GAP: 14 (ref 5–15)
Anion gap: 16 — ABNORMAL HIGH (ref 5–15)
Anion gap: 13 (ref 5–15)
Anion gap: 15 (ref 5–15)
Anion gap: 15 (ref 5–15)
BUN: 14 mg/dL (ref 6–23)
BUN: 14 mg/dL (ref 6–23)
BUN: 13 mg/dL (ref 6–23)
BUN: 13 mg/dL (ref 6–23)
BUN: 13 mg/dL (ref 6–23)
BUN: 15 mg/dL (ref 6–23)
CALCIUM: 8.4 mg/dL (ref 8.4–10.5)
CHLORIDE: 98 meq/L (ref 96–112)
CHLORIDE: 105 meq/L (ref 96–112)
CHLORIDE: 104 meq/L (ref 96–112)
CHLORIDE: 102 meq/L (ref 96–112)
CO2: 19 mEq/L (ref 19–32)
CO2: 18 meq/L — AB (ref 19–32)
CO2: 18 mEq/L — ABNORMAL LOW (ref 19–32)
CO2: 18 mEq/L — ABNORMAL LOW (ref 19–32)
CO2: 19 mEq/L (ref 19–32)
CO2: 20 mEq/L (ref 19–32)
CREATININE: 0.69 mg/dL (ref 0.50–1.10)
Calcium: 8 mg/dL — ABNORMAL LOW (ref 8.4–10.5)
Calcium: 8.6 mg/dL (ref 8.4–10.5)
Calcium: 8.7 mg/dL (ref 8.4–10.5)
Calcium: 8.9 mg/dL (ref 8.4–10.5)
Calcium: 8.7 mg/dL (ref 8.4–10.5)
Chloride: 99 mEq/L (ref 96–112)
Chloride: 104 mEq/L (ref 96–112)
Creatinine, Ser: 0.67 mg/dL (ref 0.50–1.10)
Creatinine, Ser: 0.66 mg/dL (ref 0.50–1.10)
Creatinine, Ser: 0.65 mg/dL (ref 0.50–1.10)
Creatinine, Ser: 0.63 mg/dL (ref 0.50–1.10)
Creatinine, Ser: 0.68 mg/dL (ref 0.50–1.10)
GFR calc Af Amer: >90 mL/min (ref >90)
GFR calc Af Amer: >90 mL/min (ref >90)
GFR calc Af Amer: >90 mL/min (ref >90)
GFR calc non Af Amer: 89 mL/min — ABNORMAL LOW (ref >90)
GFR calc non Af Amer: >90 mL/min (ref >90)
GFR, EST NON AFRICAN AMERICAN: 90 mL/min — AB (ref >90)
GLUCOSE: 145 mg/dL — AB (ref 70–99)
Glucose, Bld: 133 mg/dL — ABNORMAL HIGH (ref 70–99)
Glucose, Bld: 145 mg/dL — ABNORMAL HIGH (ref 70–99)
Glucose, Bld: 152 mg/dL — ABNORMAL HIGH (ref 70–99)
Glucose, Bld: 125 mg/dL — ABNORMAL HIGH (ref 70–99)
Glucose, Bld: 114 mg/dL — ABNORMAL HIGH (ref 70–99)
POTASSIUM: 3 meq/L — AB (ref 3.7–5.3)
POTASSIUM: 5.3 meq/L (ref 3.7–5.3)
POTASSIUM: 4.2 meq/L (ref 3.7–5.3)
POTASSIUM: 3.9 meq/L (ref 3.7–5.3)
POTASSIUM: 3.9 meq/L (ref 3.7–5.3)
Potassium: 3.9 mEq/L (ref 3.7–5.3)
SODIUM: 136 meq/L — AB (ref 137–147)
SODIUM: 137 meq/L (ref 137–147)
SODIUM: 136 meq/L — AB (ref 137–147)
SODIUM: 138 meq/L (ref 137–147)
Sodium: 132 mEq/L — ABNORMAL LOW (ref 137–147)
Sodium: 133 mEq/L — ABNORMAL LOW (ref 137–147)

## 2013-11-27 LAB — GLUCOSE, CAPILLARY
GLUCOSE-CAPILLARY: 138 mg/dL — AB (ref 70–99)
Glucose-Capillary: 146 mg/dL — ABNORMAL HIGH (ref 70–99)
Glucose-Capillary: 177 mg/dL — ABNORMAL HIGH (ref 70–99)
Glucose-Capillary: 156 mg/dL — ABNORMAL HIGH (ref 70–99)
Glucose-Capillary: 135 mg/dL — ABNORMAL HIGH (ref 70–99)
Glucose-Capillary: 174 mg/dL — ABNORMAL HIGH (ref 70–99)

## 2013-11-27 LAB — CBC
HEMATOCRIT: 31.8 % — AB (ref 36.0–46.0)
Hemoglobin: 11.1 g/dL — ABNORMAL LOW (ref 12.0–15.0)
MCH: 34.2 pg — AB (ref 26.0–34.0)
MCHC: 34.9 g/dL (ref 30.0–36.0)
MCV: 97.8 fL (ref 78.0–100.0)
PLATELETS: 271 10*3/uL (ref 150–400)
RBC: 3.25 MIL/uL — ABNORMAL LOW (ref 3.87–5.11)
RDW: 13.2 % (ref 11.5–15.5)
WBC: 11.1 10*3/uL — ABNORMAL HIGH (ref 4.0–10.5)

## 2013-11-27 LAB — MAGNESIUM: Magnesium: 1.6 mg/dL (ref 1.5–2.5)

## 2013-11-27 LAB — PHOSPHORUS: Phosphorus: 2.7 mg/dL (ref 2.3–4.6)

## 2013-11-27 LAB — VALPROIC ACID LEVEL: Valproic Acid Lvl: 83.8 ug/mL (ref 50.0–100.0)

## 2013-11-27 MED ORDER — POTASSIUM CHLORIDE 20 MEQ/15ML (10%) PO LIQD
40.0000 meq | ORAL | Status: AC
  Administered 2013-11-27 (×2): 40 meq
  Filled 2013-11-27 (×4): qty 30

## 2013-11-27 NOTE — Progress Notes (Signed)
Rehab admissions - Noted patient on vent.  Will follow along for rehab needs once patient off vent and medically stable.  Call me for questions.  #[phone]

## 2013-11-27 NOTE — Procedures (Addendum)
Electroencephalogram report- LTM   Ordering Physician : Dr Erlinda Hong  EEG number: [phone]  Data acquisition: 10-20 electrode placement.  Additional T1, T2, and EKG electrodes; 26 channel digital referential acquisition reformatted to 18 channel/7 channel coronal bipolar      Beginning time: 11/26/13 Ending time:11/27/13  Day of study: day 1   This 24 hours of intensive EEG monitoring with simultaneous video monitoring was performed for this patient with TBI, ICH and SAH, unresponsive, intubated. This EEG was requested to rule out subclinical electrographic seizures. Sedation status is unavailable.  Medications: Depakote ,  Vimpat  fentanyl Lovenox Reglan.   There was no pushbutton activations events during this recording.     Background activities were marked by  predominately 0.5 to 1.5 cps delta slowing distributed broadly and was non reactive to external stimuli. Passive eyes opening was not performed.  Faster frequencies were present on the left ranging between 5-6 cps with some posterior dominance. Right hemispheric  background activities were marked by continouse attenuated delta slowing. Faster frequencies were not present on the right.    There was no epileptiform discharges clinical or subclinical seizures present.    Clinical interpretation: This 24 hours of intensive EEG monitoring with simultaneous video monitoring did not record any clinical or subclinical seizures.  Background activities were marked by background activities slowing non- reactive to external stimuli, suggestive of a severe encephalopathy of nonspecific etiologies including toxic metabolic pharmacologic multifocal degenerative etiologies.  Right hemisphere background slowing was more prominent and  attenuated , lacking runs of faster frequencies.  These  Findings are  suggestive of an  additional neuronal  dysfunction across the right hemisphere. There is no epileptiform discharges clinical or subclinical  seizures  present. Clinical correlation is advised.      Day of study: day 2 Beginning time: 11/27/13 Ending time:11/28/13   This 24 hours of intensive EEG monitoring with simultaneous video monitoring was performed for this patient with TBI, ICH and SAH, unresponsive, intubated and sedated.  This EEG was requested to rule out subclinical electrographic seizures.  Medications: Depakote ,  Vimpat  fentanyl Lovenox Reglan.   There was no pushbutton activations events during this recording.     Background activities were marked by  predominately 0.5 to 1.5 cps delta slowing distributed broadly and was non reactive to external stimuli. Passive eyes opening was not performed.  Faster frequencies were present on the left ranging between 5-6 cps with some posterior dominance at times reaching 6-7 cps on the left but poorly sustained .  Right hemispheric  background activities were marked by continouse attenuated delta slowing. Faster frequencies were not present on the right.  EEG was reactive to external stimuli and marked by emergence of fast frequencies across the left hemisphere.    There was no epileptiform discharges clinical or subclinical seizures present.    Clinical interpretation: This 24 hours of intensive EEG monitoring with simultaneous video monitoring did not record any clinical or subclinical seizures.  Background activities were marked by background activities slowing, minimally  reactive to external stimuli, suggestive of a severe encephalopathy of nonspecific etiologies including toxic metabolic pharmacologic or multifocal degenerative etiologies.  Right hemisphere background slowing was more prominent  and  attenuated , lacking runs of faster frequencies.  These  findings are  suggestive of an  additional neuronal  dysfunction across the right hemisphere. There is no epileptiform discharges clinical or subclinical  seizures present. This EEG is essentially unchanged from previously   recorded.  Clinical  correlation is advised.

## 2013-11-27 NOTE — Progress Notes (Signed)
Sodium improved.  No new witnessed seizures.  On continuous EEG monitoring.  Angio tentatively scheduled for tomorrow, which we may elect to delay, depending on patient's neurologic status.

## 2013-11-27 NOTE — Progress Notes (Signed)
OT Discharge Note  Patient Details Name: Carla Hoover MRN: [id number] DOB: 01/04/1951   Cancelled Treatment:    Reason Eval/Treat Not Completed: Patient not medically ready (await new orders when not on vent)  [name] Pager: [phone]  11/27/2013, 7:55 AM

## 2013-11-27 NOTE — Progress Notes (Signed)
PULMONARY / CRITICAL CARE MEDICINE   Name: Carla Hoover MRN: [id number] DOB: 1950/08/29    ADMISSION DATE:  11/21/2013 CONSULTATION DATE:  11/18/2013  REFERRING MD :  Erline Levine  CHIEF COMPLAINT:  Weakness  INITIAL PRESENTATION:  63 yo female fell and hit her head one day prior to admit.  Presented to [facility] with Lt sided weakness and facial drop.  CT head showed ICH with SAH.  She required intubation prior to transfer to [facility].  PCCM consulted for vent/medical management.  STUDIES:  9/06 CT head ([place]) 9/07 Cerebral arteriogram >> 7 mm Lt MCA Aneurysm, vasospasm of Rt MCA 9/09 CT head >> no change in ICH, SAH, SDH 9/11 CT head >> unchanged ICH or MCA infarct changes 9/13 head CT- 6 mm shift, no change in ICH  SIGNIFICANT EVENTS: 9/06 Transfer from [place] to [facility] 9/11 More lethargic, Lt side weaker >> start [facility] therapy  9/13 ETT placed, hyponatemia  SUBJECTIVE:  Afebrile Not on sedative gtt Excellent UO   VITAL SIGNS: Temp:  [99 F (37.2 C)-99.9 F (37.7 C)] 99.6 F (37.6 C) (09/15 0800) Pulse Rate:  [25-115] 87 (09/15 0800) Resp:  [19-30] 29 (09/15 0800) BP: (134-245)/(56-194) 179/78 mmHg (09/15 0800) SpO2:  [86 %-100 %] 100 % (09/15 0800) Arterial Line BP: (92-231)/(60-96) 193/76 mmHg (09/15 0800) FiO2 (%):  [30 %] 30 % (09/15 0800) INTAKE / OUTPUT:  Intake/Output Summary (Last 24 hours) at 11/27/13 0852 Last data filed at 11/27/13 0700  Gross per 24 hour  Intake 2855.52 ml  Output   4600 ml  Net -1744.48 ml    PHYSICAL EXAMINATION:  Gen: acutely ill, intubated HEENT NG tube PULM:clear BL CV: RRR, no mgr AB: BS+, soft, nontender Ext: warm no edema Neuro:moves extremitties to  pain, pupils dilated reactive BL, lt plantar upgoing,does not follow commands  LABS:  CBC  Recent Labs Lab 11/25/13 0600 11/26/13 0435 11/27/13 0600  WBC 10.3 13.1* 11.1*  HGB 12.0 10.8* 11.1*  HCT 32.6* 30.1* 31.8*  PLT 227 280 271   Coag's No  results found for this basename: APTT, INR,  in the last 168 hours BMET  Recent Labs Lab 11/26/13 2200 11/27/13 0200 11/27/13 0600  NA 131* 132* 133*  K 3.1* 3.0* 3.9  CL 97 98 99  CO2 18* 19 18*  BUN 12 14 14   CREATININE 0.65 0.69 0.67  GLUCOSE 137* 133* 145*   Electrolytes  Recent Labs Lab 11/24/13 0438 11/25/13 0600  11/26/13 0435  11/26/13 2200 11/27/13 0200 11/27/13 0600  CALCIUM 7.3* 8.1*  < > 7.7*  < > 8.1* 8.0* 8.4  MG 0.9* 1.3*  --  2.7*  --   --   --  1.6  PHOS 1.8*  --   --   --   --   --   --  2.7  < > = values in this interval not displayed.  ABG  Recent Labs Lab 11/24/13 0528 11/25/13 1540  PHART 7.517* 7.458*  PCO2ART 28.6* 26.6*  PO2ART 61.1* 130.0*   Liver Enzymes  Recent Labs Lab 11/26/13 0435  AST 86*  ALT 48*  ALKPHOS 88  BILITOT 0.3  ALBUMIN 2.2*   Imaging Dg Chest Port 1 View  11/26/2013   CLINICAL DATA:  Endotracheal tube.  EXAM: PORTABLE CHEST - 1 VIEW  COMPARISON:  One-view chest 11/24/2013.  FINDINGS: The endotracheal tube remains somewhat low, 2 cm from the carina and directed towards the right mainstem bronchus. A could be pulled  back 1-2 cm for more optimal positioning. The tip of a feeding tube is in the fundus the stomach. A left IJ line is stable.  The heart is mildly enlarged. Bilateral pleural effusions persist. Bibasilar atelectasis is not significantly changed.  IMPRESSION: 1. The endotracheal tube terminates 2 cm above the carina and could be pulled back 1-2 cm for more optimal positioning. 2. The tip of the feeding tube is in the fundus the stomach. 3. Stable left IJ line. 4. Stable mild cardiomegaly and bilateral pleural effusions. 5. Bibasilar atelectasis is not significantly changed.   Electronically Signed   By: Lawrence Santiago M.D.   On: 11/26/2013 08:56     ASSESSMENT / PLAN: NEUROLOGIC A:   ICH with SAH with concern for vasospasm/MCA stroke 9/11 Hx of anxiety, depression, insomnia  Hx of ETOH abuse Seizure  likely P:   -Valproate for seizure prevention per neurology -consider eeg continuous if recurrent seizure -Nimotop per neurosurgery -Continue simvastatin -Continue HHH therapy >> goal SBP 160 to 180 with neo gtt - cvp to goal 10-12 -Intermittent TCD's -Continue prozac -Thiamine, folic acid   PULMONARY OETT 9/06 >> 9/09 A: Acute respiratory failure in setting of ICH Bibasilar Atelectasis with mild hypoxemia Hx of COPD, tobacco abuse P:   - Oxygen to keep SpO2 > 92% - PRN albuterol -SBTs but no extubation until vasospasm resolved  CARDIOVASCULAR aline 9/11 >> CVL 9/11 >> A:  Hx of CAD, hyperlipidemia, HTN, chronic systolic heart failure, AAA. P:  -Hold ASA, plavix in setting of ICH until okay with neurology/neurosurgery -Hold lopressor, imdur, lasix while getting HHH therapy -neo as needed for goal SBP 160-180 vs MAP 80-105 -nimodipine x 21 days -Continue simvastatin (uses pravachol as outpt)   RENAL A:   Hyponatremia Decline in Na with associated seizures  R/o siadh (as limited progress and na drop on nacl), doubt CSW P:   consider addition florinef Ct 3% with associated clinical seizures, goal na rise 0.5 /hr , no more 12 in 24 hrs bmet to q4h output not c/w csw at this stage, but urine na apporaches  GASTROINTESTINAL A:   Protein calorie malnutrition Dysphagia Hx of IBS  P:   -Protonix for SUP -Panda tube for meds, TFs  HEMATOLOGIC A:   Anemia of critical illness. P:  -F/u CBC  INFECTIOUS A:   Asp likely P:   -ct  levo ( pcn all) sputum  ENDOCRINE A:   Hyperglycemia   P:   - ssi  TODAY'S [place] therapy for vasospasm post SAH, prognosis guarded   Care during the described time interval was provided by me and/or other providers on the critical care team.  I have reviewed this patient's available data, including medical history, events of note, physical examination and test results as part of my evaluation  CC time x 30m  Kara Mead MD. Shade Flood. [facility] Pager [phone] If no response call [phone]

## 2013-11-27 NOTE — Progress Notes (Signed)
eLink Physician-Brief Progress Note Patient Name: Carla Hoover DOB: 05/10/1950 MRN: [id number]   Date of Service  11/27/2013  HPI/Events of Note  Hypokalemia  eICU Interventions  Potassium replaced     Intervention Category Intermediate Interventions: Electrolyte abnormality - evaluation and management  DETERDING,ELIZABETH 11/27/2013, 3:03 AM

## 2013-11-27 NOTE — Progress Notes (Signed)
Subjective: Patient reports (vent support)  Objective: Vital signs in last 24 hours: Temp:  [98.7 F (37.1 C)-99.9 F (37.7 C)] 99.9 F (37.7 C) (09/15 0339) Pulse Rate:  [25-115] 87 (09/15 0700) Resp:  [19-30] 22 (09/15 0700) BP: (115-245)/(55-194) 150/76 mmHg (09/15 0700) SpO2:  [86 %-100 %] 100 % (09/15 0700) Arterial Line BP: (92-231)/(57-96) 161/68 mmHg (09/15 0700) FiO2 (%):  [30 %] 30 % (09/15 0408)  Intake/Output from previous day: 09/14 0701 - 09/15 0700 In: 2951.8 [I.V.:1813.7; NG/GT:745.7; IV Piggyback:392.5] Out: 4600 [Urine:4500; Stool:100] Intake/Output this shift:    Remains intubated. Withdraws right foot to touch. Does not respond to voice/follow commands. Pupils equal & slowly reactive at 29mm.   Lab Results:  Recent Labs  11/26/13 0435 11/27/13 0600  WBC 13.1* 11.1*  HGB 10.8* 11.1*  HCT 30.1* 31.8*  PLT 280 271   BMET  Recent Labs  11/27/13 0200 11/27/13 0600  NA 132* 133*  K 3.0* 3.9  CL 98 99  CO2 19 18*  GLUCOSE 133* 145*  BUN 14 14  CREATININE 0.69 0.67  CALCIUM 8.0* 8.4    Studies/Results: Ct Head Wo Contrast  11/25/2013   CLINICAL DATA:  Intracranial hemorrhage  EXAM: CT HEAD WITHOUT CONTRAST  TECHNIQUE: Contiguous axial images were obtained from the base of the skull through the vertex without intravenous contrast.  COMPARISON:  CT head 11/23/2013  FINDINGS: Lobar infarction right MCA territory with edema in the right lateral basal ganglia and throughout the right MCA territory. Hemorrhagic transformation of the infarct is noted and unchanged. Hematoma in the infarct is stable. Subarachnoid hemorrhage on the right is stable. Small amount of interhemispheric subdural hemorrhage also stable.  Ventricle size is normal. There is mass-effect on the right lateral ventricle with 6 mm midline shift which is approximately the same.  No new area of hemorrhage or infarction compared with the prior study.  IMPRESSION: Hemorrhagic right MCA infarct  is stable. 6 mm midline shift unchanged. No new findings.   Electronically Signed   By: Franchot Gallo M.D.   On: 11/25/2013 10:31   Dg Chest Port 1 View  11/26/2013   CLINICAL DATA:  Endotracheal tube.  EXAM: PORTABLE CHEST - 1 VIEW  COMPARISON:  One-view chest 11/24/2013.  FINDINGS: The endotracheal tube remains somewhat low, 2 cm from the carina and directed towards the right mainstem bronchus. A could be pulled back 1-2 cm for more optimal positioning. The tip of a feeding tube is in the fundus the stomach. A left IJ line is stable.  The heart is mildly enlarged. Bilateral pleural effusions persist. Bibasilar atelectasis is not significantly changed.  IMPRESSION: 1. The endotracheal tube terminates 2 cm above the carina and could be pulled back 1-2 cm for more optimal positioning. 2. The tip of the feeding tube is in the fundus the stomach. 3. Stable left IJ line. 4. Stable mild cardiomegaly and bilateral pleural effusions. 5. Bibasilar atelectasis is not significantly changed.   Electronically Signed   By: Lawrence Santiago M.D.   On: 11/26/2013 08:56   Portable Chest Xray  11/25/2013   CLINICAL DATA:  Endotracheal tube placement  EXAM: PORTABLE CHEST - 1 VIEW  COMPARISON:  11/24/2013  FINDINGS: ET tube tip is above the carina. There is a left IJ catheter with tip in the projection the SVC. A feeding tube is identified with tip below the field of view. Heart size is mildly enlarged. There are small pleural effusions. Bibasilar atelectasis of is again noted  and appears improved from previous exam  IMPRESSION: 1. ETT tip is above the carina. 2. Improved bibasilar atelectasis.   Electronically Signed   By: Kerby Moors M.D.   On: 11/25/2013 15:28    Assessment/Plan:   LOS: 9 days  Continue support   Verdis Prime 11/27/2013, 7:46 AM

## 2013-11-27 NOTE — Progress Notes (Signed)
STROKE TEAM PROGRESS NOTE   HISTORY  63 y.o. 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 She has surrounding cytotoxic edema and midline shift and is employed left MCA aneurysm as well.    SUBJECTIVE (INTERVAL HISTORY)  No seizure activity overnight. EEG bedside this am no seizure on screen. EEG LTM report showed no seizure. Continued on 3% for hyponatremia, and Na steadily coming up. Still intubated for airway protection. Neuro no change since yesterday.  OBJECTIVE  Filed Vitals:   11/27/13 1609 11/27/13 1700 11/27/13 1800 11/27/13 1900  BP: 187/78 170/93 186/96   Pulse: 90 102 102 122  Temp:      TempSrc:      Resp: 27 29 27 29   Height:      Weight:      SpO2: 100% 100% 100% 100%    Recent Labs  Lab  12/02/2013 1900  12/10/2013 0245  11/20/13 0550  11/21/13 0500   NA  132*  128*  131*  131*   K  3.6*  3.7  3.2*  2.7*   CL  93*  92*  96  95*   CO2  23  20  22  22    GLUCOSE  142*  148*  109*  124*   BUN  10  11  10  9    CREATININE  0.99  0.98  0.82  0.72   CALCIUM  8.5  8.0*  7.2*  7.0*   MG  1.0*  --  --  --     Recent Labs  Lab  12/02/2013 1900  11/20/13 0550   AST  74*  49*   ALT  37*  24   ALKPHOS  122*  86   BILITOT  1.2  0.9   PROT  6.8  5.8*   ALBUMIN  3.3*  2.6*     Recent Labs  Lab  11/30/2013 2345  11/29/2013 0245  11/20/13 0550  11/21/13 0500   WBC  --  7.1  8.0  7.8   NEUTROABS  --  --  6.2  --   HGB  --  11.1*  9.6*  9.9*   HCT  --  32.0*  28.0*  28.6*   MCV  --  100.3*  99.6  99.3   PLT  205  186  248  215     Recent Labs   12/12/2013 1900   LABPROT  12.5   INR  0.93      Component  Value  Date/Time    CHOL  118  05/18/2012 0512    TRIG  90  11/28/2013 1900    HDL  66  05/18/2012 0512    CHOLHDL  1.8  05/18/2012 0512    VLDL  14  05/18/2012 0512    LDLCALC   38  05/18/2012 0512    Ct Angio Head W/cm &/or Wo Cm  12/11/2013 1. Unchanged appearance of extensive, predominantly right-sided subarachnoid hemorrhage as well as 2.7 cm hematoma in the region of the right insula. Moderate surrounding edema with 5 mm of unchanged midline shift. 2. No right MCA aneurysm is identified, however there is severe attenuation of the right MCA and its branch vessels which may reflect local mass effect and vasospasm secondary to surrounding hemorrhage, and this could obscure an underlying right MCA aneurysm. Narrowing  of the ACAs and right ICA terminus also may reflect vasospasm. Catheter angiography may be helpful for further evaluation. 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  Dg Chest 1 View  11/21/2013 Improved aeration of the left lung base with mild basilar atelectasis remaining  11/20/2013 Slight increase in left basilar opacity most consistent with atelectasis.  11/21/2013 No significant change in mild opacity in the lateral left lung base.  11/13/2013 Endotracheal tube and nasogastric tube in appropriate position. Stable mild opacity in lateral left lung base.  Ct Head Wo Contrast  11/25/2013 Hemorrhagic right MCA infarct is stable. 6 mm midline shift unchanged. No new findings. 11/23/2013 1. Unchanged intracranial hemorrhage as above. 2. Unchanged edema in the right temporal, frontal, and parietal lobes, consistent with right MCA infarct. Similar degree of leftward midline shift. 11/21/2013 1. No significant interval change in intraparenchymal, subarachnoid, and subdural hemorrhage as compared to 11/20/13. Associated vasogenic edema is similar with stable 4-5 mm of right-to-left midline shift. No hydrocephalus. 2. No new hemorrhage.  11/20/2013 1. No change in the intracranial hemorrhage since the most recent prior study. Right frontal parenchymal hematoma is stable in size as is the degree of mass effect. Subarachnoid hemorrhage and areas of subdural  hemorrhage are stable. No new intracranial hemorrhage. 2. Mass effect and midline shift ir stable. 3. No hydrocephalus.  11/21/2013 Similar extensive right greater than left subarachnoid blood, with a trace possible posterior falcine subdural hematoma. Evolving 2.7 x 2.4 cm right insular hematoma with similar 5 mm right to left midline shift. No hydrocephalus.  TCD  12/05/2013 Limited study due to poor windows and neck strap limiting evaluation. Low normal mean flow velocities in identified vessels of anterior circulation. Globally elevated pulsatility indices suggest increase intracranial presure or diffuse intracranial atherosclerosis.  EEG LTM 11/27/13 Clinical interpretation: This 24 hours of intensive EEG monitoring with simultaneous video monitoring did not record any clinical or subclinical seizures. Background activities were marked by background activities slowing non- reactive to external stimuli, suggestive of a severe encephalopathy of nonspecific etiologies including toxic metabolic pharmacologic multifocal degenerative etiologies. Right hemisphere background slowing was more prominent and attenuated , lacking runs of faster frequencies. These Findings are suggestive of an additional neuronal dysfunction across the right hemisphere. There is no epileptiform discharges clinical or subclinical seizures present.  Clinical correlation is advised.  PHYSICAL EXAM  Filed Vitals:   11/27/13 1609 11/27/13 1700 11/27/13 1800 11/27/13 1900  BP: 187/78 170/93 186/96   Pulse: 90 102 102 122  Temp:      TempSrc:      Resp: 27 29 27 29   Height:      Weight:      SpO2: 100% 100% 100% 100%    [name] intubated. Afebrile. Head is nontraumatic. Neck is supple without bruit. Cardiac exam no murmur or gallop. Lungs are coarse rhonchi bilaterally. Distal pulses are well felt.   Neurologic Examination:  Intubated and not on sedation. Unresponsive to voice, not following commands, but still  responds to sternal rub and pain stimulation by localizing with right arm, can lift to barely against gravity with RLE on pain stimulation. LUE 0/5 and LLE 2/5 on pain stimulation. Pupils are equal and reactive. Fundi were not visualized. Right gaze preference. Right plantar is downgoing left is equivocal. Coordination and gait cannot be tested.  ASSESSMENT/PLAN  Carla Hoover is a 63 y.o. female with history of CHF, depression, elevated cholesterol, COPD, MI, and CAD  on aspirin and plavix s/p fall where she hit her head. Presenting to [facility] 11/29/2013 with left sided weakness, facial droop and headache. Intubated there and transferred to [facility]. Imaging confirms a right SAH, R IPH, with right MCA territory edema and hypodensity, concerning for right MCA stroke due to vasospasm. Developed partial seizure episodes, increased depakote and load with vimpat. Hyponatremia with likely CNS salt wasting, on 3% saline. Intubated due to airway protection. Na up to 126 today from 119 yesterday.  Hemorrhage: R insular IPH with R perisylvian SAH & R SDH likely from R MCA aneurysm not seen on angio, now with new neurological deficits likely related to vasospasm with vasogenic edema and mass effect  aspirin 81 mg orally every day and clopidogrel 75 mg orally every day prior to admission, held now secondary to IPH Vasospasm confirmed on angiogram and treated with verapamil IA.  Concerning for right MCA stroke due to vasospasm Repeat CT stable lovenox for VTE prophylaxis  NPO  Bedrest  Resultant Left hemiplegia  Respiratory failure - intubated on no sedation - vent per CCM  Partial seizure - left UE shaking episodes, still intermittent overnight - depakote increased to 750mg  tid - increase vimpat maintenance dose to 100mg  bid - depakote level 85.6 today - LTM EEG showed no seizure - d/c in am  Hyponatremia - Na 119 -> 126 -> 133 - Urine Na 80 - likely salt wasting - on 3% saline increased  to 35cc/h - BMP q4h  Cerebral vasospasm - on nimodipine - s/p IA verapamil - triple H therapy, on neo - NSG on board  Hypertension  - part of the triple-H therapy now - on neo for desired hypertension - NSG on board - hemorrhage so far stable - goal 160-180  Hyperlipidemia  Home meds: pravachol. 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 Coronary artery disease - MI, angioplasty 2013 Ischemic cardiomyopathy  Other Pertinent History  AAA, infrarenal  R hepatic lobe lesion   Hospital day # 3   This patient is critically ill due to right MCA infarct due to seizure, vasospasm which is difficult to control, right ICH with SAH due to aneurysm rupture, and at significant risk of neurological worsening, death and care requires constant monitoring of vital signs, hemodynamics,respiratory and cardiac monitoring,review of multiple databases, neurological assessment, discussion with family, other specialists and medical decision making of high complexity.I have made any additions or clarifications directly to the above note. I spent 35 minutes of neurocritical care time in the care of this patient.  Rosalin Hawking, MD PhD Stroke Neurology 11/27/2013 7:02 PM   To contact Stroke Continuity provider, please refer to [email or web address]  After hours, contact General Neurology

## 2013-11-28 ENCOUNTER — Inpatient Hospital Stay (HOSPITAL_COMMUNITY): Payer: Medicare Other

## 2013-11-28 ENCOUNTER — Other Ambulatory Visit (HOSPITAL_COMMUNITY): Payer: Medicare Other

## 2013-11-28 LAB — BASIC METABOLIC PANEL
ANION GAP: 12 (ref 5–15)
ANION GAP: 16 — AB (ref 5–15)
Anion gap: 16 — ABNORMAL HIGH (ref 5–15)
Anion gap: 14 (ref 5–15)
BUN: 21 mg/dL (ref 6–23)
BUN: 20 mg/dL (ref 6–23)
BUN: 22 mg/dL (ref 6–23)
BUN: 26 mg/dL — ABNORMAL HIGH (ref 6–23)
CALCIUM: 8.9 mg/dL (ref 8.4–10.5)
CALCIUM: 8.6 mg/dL (ref 8.4–10.5)
CALCIUM: 9.1 mg/dL (ref 8.4–10.5)
CO2: 20 mEq/L (ref 19–32)
CO2: 20 meq/L (ref 19–32)
CO2: 18 meq/L — AB (ref 19–32)
CO2: 18 mEq/L — ABNORMAL LOW (ref 19–32)
CREATININE: 0.71 mg/dL (ref 0.50–1.10)
Calcium: 8.9 mg/dL (ref 8.4–10.5)
Chloride: 106 mEq/L (ref 96–112)
Chloride: 106 mEq/L (ref 96–112)
Chloride: 110 mEq/L (ref 96–112)
Chloride: 107 mEq/L (ref 96–112)
Creatinine, Ser: 0.73 mg/dL (ref 0.50–1.10)
Creatinine, Ser: 0.7 mg/dL (ref 0.50–1.10)
Creatinine, Ser: 0.73 mg/dL (ref 0.50–1.10)
GFR calc Af Amer: >90 mL/min (ref >90)
GFR calc Af Amer: >90 mL/min (ref >90)
GFR calc Af Amer: >90 mL/min (ref >90)
GFR calc Af Amer: >90 mL/min (ref >90)
GFR calc non Af Amer: 89 mL/min — ABNORMAL LOW (ref >90)
GFR calc non Af Amer: 89 mL/min — ABNORMAL LOW (ref >90)
GFR, EST NON AFRICAN AMERICAN: 88 mL/min — AB (ref >90)
GFR, EST NON AFRICAN AMERICAN: 88 mL/min — AB (ref >90)
GLUCOSE: 132 mg/dL — AB (ref 70–99)
Glucose, Bld: 129 mg/dL — ABNORMAL HIGH (ref 70–99)
Glucose, Bld: 172 mg/dL — ABNORMAL HIGH (ref 70–99)
Glucose, Bld: 139 mg/dL — ABNORMAL HIGH (ref 70–99)
POTASSIUM: 4.9 meq/L (ref 3.7–5.3)
POTASSIUM: 3.3 meq/L — AB (ref 3.7–5.3)
Potassium: 3.8 mEq/L (ref 3.7–5.3)
Potassium: 3.3 mEq/L — ABNORMAL LOW (ref 3.7–5.3)
SODIUM: 140 meq/L (ref 137–147)
Sodium: 142 mEq/L (ref 137–147)
Sodium: 140 mEq/L (ref 137–147)
Sodium: 141 mEq/L (ref 137–147)

## 2013-11-28 LAB — CBC
HEMATOCRIT: 31.4 % — AB (ref 36.0–46.0)
Hemoglobin: 10.6 g/dL — ABNORMAL LOW (ref 12.0–15.0)
MCH: 33.9 pg (ref 26.0–34.0)
MCHC: 33.8 g/dL (ref 30.0–36.0)
MCV: 100.3 fL — AB (ref 78.0–100.0)
Platelets: 272 10*3/uL (ref 150–400)
RBC: 3.13 MIL/uL — ABNORMAL LOW (ref 3.87–5.11)
RDW: 13.6 % (ref 11.5–15.5)
WBC: 12.1 10*3/uL — ABNORMAL HIGH (ref 4.0–10.5)

## 2013-11-28 LAB — GLUCOSE, CAPILLARY
Glucose-Capillary: 133 mg/dL — ABNORMAL HIGH (ref 70–99)
Glucose-Capillary: 153 mg/dL — ABNORMAL HIGH (ref 70–99)
Glucose-Capillary: 143 mg/dL — ABNORMAL HIGH (ref 70–99)
Glucose-Capillary: 118 mg/dL — ABNORMAL HIGH (ref 70–99)
Glucose-Capillary: 136 mg/dL — ABNORMAL HIGH (ref 70–99)

## 2013-11-28 LAB — CULTURE, RESPIRATORY W GRAM STAIN

## 2013-11-28 LAB — CULTURE, RESPIRATORY

## 2013-11-28 LAB — VALPROIC ACID LEVEL: Valproic Acid Lvl: 86.2 ug/mL (ref 50.0–100.0)

## 2013-11-28 MED ORDER — POTASSIUM CHLORIDE 20 MEQ/15ML (10%) PO LIQD
ORAL | Status: AC
  Filled 2013-11-28: qty 15

## 2013-11-28 MED ORDER — MAGNESIUM SULFATE 40 MG/ML IJ SOLN
2.0000 g | Freq: Once | INTRAMUSCULAR | Status: AC
  Administered 2013-11-28: 2 g via INTRAVENOUS
  Filled 2013-11-28: qty 50

## 2013-11-28 MED ORDER — SODIUM CHLORIDE 0.9 % IV SOLN
INTRAVENOUS | Status: DC
  Administered 2013-11-28 – 2013-12-06 (×11): via INTRAVENOUS

## 2013-11-28 MED ORDER — POTASSIUM CHLORIDE 20 MEQ/15ML (10%) PO LIQD
40.0000 meq | Freq: Once | ORAL | Status: AC
  Administered 2013-11-28: 40 meq
  Filled 2013-11-28: qty 30

## 2013-11-28 NOTE — Progress Notes (Deleted)
VASCULAR LAB PRELIMINARY  PRELIMINARY  PRELIMINARY  PRELIMINARY  Transcranial Doppler  Date POD PCO2 HCT BP  MCA ACA PCA OPHT SIPH VERT Basilar  9-16- 15 SB     Right  Left   14  36   --  -39   --  --   19  29   34  62   --  -25     -71         Right  Left                                            Right  Left                                             Right  Left                                             Right  Left                                            Right  Left                                            Right  Left                                        MCA = Middle Cerebral Artery      OPHT = Opthalmic Artery     BASILAR = Basilar Artery   ACA = Anterior Cerebral Artery     SIPH = Carotid Siphon PCA = Posterior Cerebral Artery   VERT = Verterbral Artery                   Normal MCA = 62+\-12 ACA = 50+\-12 PCA = 42+\-23         Kitana Gage, RVT 11/28/2013, 2:41 PM

## 2013-11-28 NOTE — Progress Notes (Signed)
VASCULAR LAB PRELIMINARY  PRELIMINARY  PRELIMINARY  PRELIMINARY  Transcranial Doppler  Date POD PCO2 HCT BP  MCA ACA PCA OPHT SIPH VERT Basilar  11/18/2013 SB     Right  Left   40  26   --  -30   --  --   21  18   30  31    --  --     --    11-21-13 hc     Right  Left   -  -   -  -   -  -   30  23   -39  56   -44  -47   -45      11-23-13 fgn     Right  Left   -  -   -  -   -  -   22  24   35  24   31  27           11-26-13 hc     Right  Left   -  -   -  -   -  -   29  21   35  64   ####  -38     -50    11-28-13 SB      Right  Left   14  36   --  -39   --  --   19  29   34  62   --  -25     -71         Right  Left                                            Right  Left                                        MCA = Middle Cerebral Artery      OPHT = Opthalmic Artery     BASILAR = Basilar Artery   ACA = Anterior Cerebral Artery     SIPH = Carotid Siphon PCA = Posterior Cerebral Artery   VERT = Verterbral Artery                   Normal MCA = 62+\-12 ACA = 50+\-12 PCA = 42+\-23   ####continuous EEG dressings and patient position--unable to obtain right vertebral  Nayla Dias, RVT 11/28/2013, 2:44 PM

## 2013-11-28 NOTE — Progress Notes (Signed)
NUTRITION FOLLOW-UP  INTERVENTION:  Continue Osmolite 1.2 via NG tube @ 40 ml/hr.   30 ml Prostat TID  Tube feeding regimen provides 1452 kcal (99% of needs), 98 grams of protein, and 787 ml of H2O.    NUTRITION DIAGNOSIS: Inadequate oral intake related to inability to eat as evidenced by NPO status; ongoing.   Goal: Pt to meet >/= 90% of their estimated nutrition needs; met.    Monitor:  TF tolerance, weight trend, diet advancement  ASSESSMENT: Pt fell one day prior to admission, presented with left sided weakness and facial drop. Per CT pt has ICH with SAH.  Pt extubated 9/9 and failed swallow evaluation NG feedings started.  Pt re-intubated 9/13 with 6 mm shift. Per MD notes pt's prognosis guarded with plans for family meeting 9/17.   Pt with hx of ETOH currently on thiamine and folic acid, also hx of COPD and tobacco abuse. Per notes hx of IBS.   Potassium low on replacement.   Patient is currently intubated on ventilator support MV: 10.3 L/min Temp (24hrs), Avg:98.7 F (37.1 C), Min:97.8 F (36.6 C), Max:99.4 F (37.4 C)  NGT: tip in stomach Pt had vomiting 9/12, now on Reglan.  Pt re-intubated 9/13 with 6 mm shift. Per MD notes pt's prognosis guarded with plans for family meeting 9/17.    Height: Ht Readings from Last 1 Encounters:  11/29/2013 5' 4"  (1.626 m)    Weight: Wt Readings from Last 1 Encounters:  11/27/13 149 lb 14.6 oz (68 kg)  Admission weight 149 lb (67.8 kg) 9/10  BMI:  Body mass index is 25.72 kg/(m^2).  Estimated Nutritional Needs: Kcal: 1467 Protein: 80-95 grams Fluid: > 1.6 L/day  Skin: groin incision  Diet Order: NPO   Intake/Output Summary (Last 24 hours) at 11/28/13 1106 Last data filed at 11/28/13 0800  Gross per 24 hour  Intake 2173.71 ml  Output   3310 ml  Net -1136.29 ml    Last BM: 9/9   Labs:   Recent Labs Lab 11/23/13 0230 11/24/13 0438 11/25/13 0600  11/26/13 0435  11/27/13 0600  11/27/13 2218  11/28/13 0238 11/28/13 0600  NA 130* 124* 119*  < > 126*  < > 133*  < > 138 142 140  K 3.1* 3.3* 3.6*  < > 3.0*  < > 3.9  < > 3.9 3.8 3.3*  CL 92* 88* 84*  < > 92*  < > 99  < > 104 106 106  CO2 23 21 18*  < > 17*  < > 18*  < > 20 20 20   BUN 7 8 9   < > 11  < > 14  < > 15 21 20   CREATININE 0.61 0.60 0.55  < > 0.71  < > 0.67  < > 0.68 0.73 0.71  CALCIUM 7.3* 7.3* 8.1*  < > 7.7*  < > 8.4  < > 8.7 8.9 8.9  MG  --  0.9* 1.3*  --  2.7*  --  1.6  --   --   --   --   PHOS  --  1.8*  --   --   --   --  2.7  --   --   --   --   GLUCOSE 138* 141* 130*  < > 159*  < > 145*  < > 114* 132* 129*  < > = values in this interval not displayed.  CBG (last 3)   Recent Labs  11/27/13 2325 11/28/13  8416 11/28/13 0803  GLUCAP 174* 133* 153*    Scheduled Meds: . antiseptic oral rinse  7 mL Mouth Rinse QID  . chlorhexidine  15 mL Mouth Rinse BID  . enoxaparin (LOVENOX) injection  40 mg Subcutaneous Q24H  . feeding supplement (OSMOLITE 1.2 CAL)  1,000 mL Per Tube Q24H  . feeding supplement (PRO-STAT SUGAR FREE 64)  30 mL Per Tube TID  . fludrocortisone  0.1 mg Per Tube Daily  . folic acid  1 mg Per Tube Daily  . insulin aspart  0-9 Units Subcutaneous 6 times per day  . lacosamide (VIMPAT) IV  100 mg Intravenous Q12H  . levofloxacin (LEVAQUIN) IV  750 mg Intravenous Q24H  . metoCLOPramide (REGLAN) injection  5 mg Intravenous 4 times per day  . NiMODipine  60 mg Oral Q4H  . pantoprazole sodium  40 mg Per Tube Q24H  . potassium chloride  40 mEq Per Tube Once  . senna-docusate  1 tablet Oral BID  . simvastatin  40 mg Per Tube q1800  . thiamine  100 mg Per Tube Daily  . valproate sodium  750 mg Intravenous 3 times per day    Continuous Infusions: . sodium chloride 10 mL/hr at 11/27/13 0800  . sodium chloride    . phenylephrine (NEO-SYNEPHRINE) Adult infusion 90 mcg/min (11/28/13 0933)  . sodium chloride (hypertonic) 17 mL/hr (11/28/13 1058)     [place], [place], [place]  Pager [phone] After Hours Pager

## 2013-11-28 NOTE — Progress Notes (Signed)
    RN says intermittent sinus tachy on monitor despite fent prn x 1  K 3.3 at 4pm  Mag 1.6 at 6am yesterday but not corrected   Plan Replete k and mag   Dr. Brand Males, M.D., [facility].C.P Pulmonary and Critical Care Medicine Staff Physician [place] [facility] Pager: [phone], If no answer or between  15:00h - 7:00h: call [phone]  11/28/2013 10:19 PM

## 2013-11-28 NOTE — Progress Notes (Signed)
Subjective: Patient reports unresponsive, on ventilator  Objective: Vital signs in last 24 hours: Temp:  [97.8 F (36.6 C)-99 F (37.2 C)] 98.6 F (37 C) (09/16 0344) Pulse Rate:  [28-130] 90 (09/16 0700) Resp:  [19-32] 27 (09/16 0700) BP: (109-200)/(52-108) 111/52 mmHg (09/16 0700) SpO2:  [99 %-100 %] 100 % (09/16 0700) Arterial Line BP: (117-201)/(49-97) 142/49 mmHg (09/16 0700) FiO2 (%):  [30 %] 30 % (09/16 0320) Weight:  [68 kg (149 lb 14.6 oz)] 68 kg (149 lb 14.6 oz) (09/15 0900)  Intake/Output from previous day: 09/15 0701 - 09/16 0700 In: 2316.7 [I.V.:1101.7; NG/GT:880; IV Piggyback:335] Out: 3890 [Urine:3690; Stool:200] Intake/Output this shift:    Physical Exam: Unresponsive, some eye opening, withdraws right greater than left sides.  Not following commands.  Pupils reactive.  No recognized seizure activity.  Still with continuous EEG monitoring.  Lab Results:  Recent Labs  11/27/13 0600 11/28/13 0600  WBC 11.1* 12.1*  HGB 11.1* 10.6*  HCT 31.8* 31.4*  PLT 271 272   BMET  Recent Labs  11/28/13 0238 11/28/13 0600  NA 142 140  K 3.8 3.3*  CL 106 106  CO2 20 20  GLUCOSE 132* 129*  BUN 21 20  CREATININE 0.73 0.71  CALCIUM 8.9 8.9    Studies/Results: Dg Chest Port 1 View  11/27/2013   CLINICAL DATA:  Clinical pneumonitis  EXAM: PORTABLE CHEST - 1 VIEW  COMPARISON:  Portable chest x-ray of 26 November 2013  FINDINGS: The lungs are mildly hypoinflated. Persistent bibasilar interstitial densities are present little changed from yesterday's study. The cardiopericardial silhouette is top-normal in size. The pulmonary vascularity is not engorged. Pleural effusions are not clearly evident today.  The endotracheal tube tip lies 3.2 cm above the crotch of the carina. The feeding tube tip overlies the gastric cardia. The left internal jugular venous catheter tip overlies the proximal portion of the SVC.  IMPRESSION: There has not been significant interval change in  the appearance of the chest since yesterday's study. There is persistent bibasilar atelectasis. The endotracheal tube and left internal jugular venous catheter are in appropriate position. Advancement of the feeding tube is recommended.   Electronically Signed   By: David  Martinique   On: 11/27/2013 08:07    Assessment/Plan: Patient doing poorly.  Continuing full support and treating hyponatremia.  Will hold off on angio today and reassess progress over the next few days.  I appreciate CCM and Neurology assistance.    LOS: 10 days    Peggyann Shoals, MD 11/28/2013, 8:01 AM

## 2013-11-28 NOTE — Progress Notes (Signed)
PULMONARY / CRITICAL CARE MEDICINE   Name: Carla Hoover MRN: [id number] DOB: [birth year]    ADMISSION DATE:  11/19/2013 CONSULTATION DATE:  11/18/2013  REFERRING MD :  Erline Levine  CHIEF COMPLAINT:  Weakness  INITIAL PRESENTATION:  63 yo female fell and hit her head one day prior to admit.  Presented to [place] with Lt sided weakness and facial drop.  CT head showed ICH with SAH.  She required intubation prior to transfer to [facility].  PCCM consulted for vent/medical management.  STUDIES:  9/06 CT head ([place]) 9/07 Cerebral arteriogram >> 7 mm Lt MCA Aneurysm, vasospasm of Rt MCA 9/09 CT head >> no change in ICH, SAH, SDH 9/11 CT head >> unchanged ICH or MCA infarct changes 9/13 head CT- 6 mm shift, no change in ICH  SIGNIFICANT EVENTS: 9/06 Transfer from [place] to [facility] 9/11 More lethargic, Lt side weaker >> start [facility] therapy  9/13 ETT placed, hyponatemia  SUBJECTIVE:  Afebrile No sedative gtt Excellent UO -remains on 3% saline   VITAL SIGNS: Temp:  [97.8 F (36.6 C)-99.4 F (37.4 C)] 99.4 F (37.4 C) (09/16 0800) Pulse Rate:  [28-130] 89 (09/16 0907) Resp:  [19-32] 26 (09/16 0907) BP: (109-200)/(52-108) 187/74 mmHg (09/16 0907) SpO2:  [99 %-100 %] 100 % (09/16 0907) Arterial Line BP: (117-201)/(49-97) 188/68 mmHg (09/16 0800) FiO2 (%):  [30 %] 30 % (09/16 0907) INTAKE / OUTPUT:  Intake/Output Summary (Last 24 hours) at 11/28/13 1007 Last data filed at 11/28/13 0800  Gross per 24 hour  Intake 2208.71 ml  Output   3310 ml  Net -1101.29 ml    PHYSICAL EXAMINATION:  Gen: acutely ill, intubated, RASS-5 HEENT NG tube PULM:clear BL CV: RRR, no mgr AB: BS+, soft, nontender Ext: warm no edema Neuro:moves extremitties to  pain, pupils dilated reactive BL, lt plantar upgoing,does not follow commands  LABS:  CBC  Recent Labs Lab 11/26/13 0435 11/27/13 0600 11/28/13 0600  WBC 13.1* 11.1* 12.1*  HGB 10.8* 11.1* 10.6*  HCT 30.1* 31.8* 31.4*  PLT  280 271 272   Coag's No results found for this basename: APTT, INR,  in the last 168 hours BMET  Recent Labs Lab 11/27/13 2218 11/28/13 0238 11/28/13 0600  NA 138 142 140  K 3.9 3.8 3.3*  CL 104 106 106  CO2 20 20 20   BUN 15 21 20   CREATININE 0.68 0.73 0.71  GLUCOSE 114* 132* 129*   Electrolytes  Recent Labs Lab 11/24/13 0438 11/25/13 0600  11/26/13 0435  11/27/13 0600  11/27/13 2218 11/28/13 0238 11/28/13 0600  CALCIUM 7.3* 8.1*  < > 7.7*  < > 8.4  < > 8.7 8.9 8.9  MG 0.9* 1.3*  --  2.7*  --  1.6  --   --   --   --   PHOS 1.8*  --   --   --   --  2.7  --   --   --   --   < > = values in this interval not displayed.  ABG  Recent Labs Lab 11/24/13 0528 11/25/13 1540  PHART 7.517* 7.458*  PCO2ART 28.6* 26.6*  PO2ART 61.1* 130.0*   Liver Enzymes  Recent Labs Lab 11/26/13 0435  AST 86*  ALT 48*  ALKPHOS 88  BILITOT 0.3  ALBUMIN 2.2*   Imaging Dg Chest Port 1 View  11/27/2013   CLINICAL DATA:  Clinical pneumonitis  EXAM: PORTABLE CHEST - 1 VIEW  COMPARISON:  Portable chest x-ray of  26 November 2013  FINDINGS: The lungs are mildly hypoinflated. Persistent bibasilar interstitial densities are present little changed from yesterday's study. The cardiopericardial silhouette is top-normal in size. The pulmonary vascularity is not engorged. Pleural effusions are not clearly evident today.  The endotracheal tube tip lies 3.2 cm above the crotch of the carina. The feeding tube tip overlies the gastric cardia. The left internal jugular venous catheter tip overlies the proximal portion of the SVC.  IMPRESSION: There has not been significant interval change in the appearance of the chest since yesterday's study. There is persistent bibasilar atelectasis. The endotracheal tube and left internal jugular venous catheter are in appropriate position. Advancement of the feeding tube is recommended.   Electronically Signed   By: David  Martinique   On: 11/27/2013 08:07      ASSESSMENT / PLAN: NEUROLOGIC A:   ICH with [place] with concern for vasospasm/MCA stroke 9/11 Hx of anxiety, depression, insomnia  Hx of ETOH abuse Seizure likely, cEEG did not show P:   -Valproate for seizure prevention per neurology -Nimotop per neurosurgery -Continue simvastatin -Continue HHH therapy >> goal SBP 160 to 180 with neo gtt - cvp to goal 10-12, taper 3% saline & use NS @ 75/h -Intermittent TCD's -Continue prozac -Thiamine, folic acid   PULMONARY OETT 9/06 >> 9/09 A: Acute respiratory failure in setting of ICH Bibasilar Atelectasis with mild hypoxemia Hx of COPD, tobacco abuse P:   - Oxygen to keep SpO2 > 92% - PRN albuterol -SBTs but no extubation until vasospasm resolved  CARDIOVASCULAR aline 9/11 >> CVL 9/11 >> A:  Hx of CAD, hyperlipidemia, HTN, chronic systolic heart failure, AAA. P:  -Hold ASA, plavix in setting of ICH until okay with neurology/neurosurgery -Hold lopressor, imdur, lasix while getting HHH therapy -neo as needed for goal SBP 160-180 vs MAP 80-105 -nimodipine x 21 days -Continue simvastatin (uses pravachol as outpt)   RENAL A:   Hyponatremia Decline in Na with associated seizures  R/o siadh (as limited progress and na drop on nacl), doubt CSW P:   ct florinef Taper 3% bmet to q6h   GASTROINTESTINAL A:   Protein calorie malnutrition Dysphagia Hx of IBS  P:   -Protonix for SUP -[place]anda tube for meds, TFs  HEMATOLOGIC A:   Anemia of critical illness. P:  -F/u CBC  INFECTIOUS A:   Asp likely P:   -ct  Levo x 7ds ttoal ( pcn all) sputum  ENDOCRINE A:   Hyperglycemia   P:   - ssi  TODAY'S [place] therapy for vasospasm post SAH, prognosis guarded given worsening neuro exam   Care during the described time interval was provided by me and/or other providers on the critical care team.  I have reviewed this patient's available data, including medical history, events of note, physical examination and test  results as part of my evaluation  CC time x 4m  Kara Mead MD. [place]. [place] [facility] Pager [phone] If no response call [phone]

## 2013-11-28 NOTE — Progress Notes (Signed)
Rehab admissions - I will sign off for inpatient rehab at this time.  If patient does better, gets off the vent and restarts therapies, then can reconsider inpatient rehab admission option.  Call me for questions.  #[phone]

## 2013-11-28 NOTE — Progress Notes (Signed)
ANTIBIOTIC CONSULT NOTE - Follow-up  Pharmacy Consult for levaquin Indication: asp pna  Allergies  Allergen Reactions  . Penicillins Swelling and Other (See Comments)    Per patient report, she has facial and tongue swellling  . Percocet [Oxycodone-Acetaminophen] Other (See Comments)    Hallucinations  . Percodan [Oxycodone-Aspirin] Other (See Comments)    Hallucinations    Patient Measurements: Height: 5\' 4"  (162.6 cm) Weight: 149 lb 14.6 oz (68 kg) IBW/kg (Calculated) : 54.7   Vital Signs: Temp: 99.4 F (37.4 C) (09/16 0800) Temp src: Axillary (09/16 0800) BP: 187/74 mmHg (09/16 0907) Pulse Rate: 89 (09/16 0907) Intake/Output from previous day: 09/15 0701 - 09/16 0700 In: 2341.1 [I.V.:1126.1; NG/GT:880; IV Piggyback:335] Out: 3890 [Urine:3690; Stool:200] Intake/Output from this shift: Total I/O In: 158.6 [I.V.:68.6; NG/GT:40; IV Piggyback:50] Out: 300 [Urine:300]  Labs:  Recent Labs  11/26/13 0435  11/27/13 0600  11/27/13 2218 11/28/13 0238 11/28/13 0600  WBC 13.1*  --  11.1*  --   --   --  12.1*  HGB 10.8*  --  11.1*  --   --   --  10.6*  PLT 280  --  271  --   --   --  272  CREATININE 0.71  < > 0.67  < > 0.68 0.73 0.71  < > = values in this interval not displayed. Estimated Creatinine Clearance: 66.4 ml/min (by C-G formula based on Cr of 0.71). No results found for this basename: VANCOTROUGH, Corlis Leak, VANCORANDOM, [place], GENTPEAK, GENTRANDOM, TOBRATROUGH, TOBRAPEAK, TOBRARND, AMIKACINPEAK, AMIKACINTROU, AMIKACIN,  in the last 72 hours   Microbiology: Recent Results (from the past 720 hour(s))  MRSA PCR SCREENING     Status: None   Collection Time    12/05/2013  6:37 PM      Result Value Ref Range Status   MRSA by PCR NEGATIVE  NEGATIVE Final   Comment:            The GeneXpert MRSA Assay (FDA     approved for NASAL specimens     only), is one component of a     comprehensive MRSA colonization     surveillance program. It is not     intended  to diagnose MRSA     infection nor to guide or     monitor treatment for     MRSA infections.  CLOSTRIDIUM DIFFICILE BY PCR     Status: None   Collection Time    11/22/13  6:27 PM      Result Value Ref Range Status   C difficile by pcr NEGATIVE  NEGATIVE Final  CULTURE, RESPIRATORY (NON-EXPECTORATED)     Status: None   Collection Time    11/25/13  3:21 PM      Result Value Ref Range Status   Specimen Description ENDOTRACHEAL   Final   Special Requests NONE   Final   Gram Stain     Final   Value: FEW WBC PRESENT,BOTH PMN AND MONONUCLEAR     RARE SQUAMOUS EPITHELIAL CELLS PRESENT     ABUNDANT GRAM POSITIVE COCCI     IN PAIRS IN CLUSTERS FEW GRAM NEGATIVE RODS     Performed at [facility]   Culture     Final   Value: Non-Pathogenic Oropharyngeal-type Flora Isolated.     Performed at [facility]   Report Status 11/28/2013 FINAL   Final  CLOSTRIDIUM DIFFICILE BY PCR     Status: None   Collection Time  11/26/13  9:08 AM      Result Value Ref Range Status   C difficile by pcr NEGATIVE  NEGATIVE Final   Assessment: 63 yo female on Levaquin Day #4/planned 7 days for concern of aspiration PNA. WBC 12.1. Afeb.   9/13 levaquin>>  9/13 resp cx>>neg 9/14 Cdiff neg  Plan:  - Levaquin 750mg  IV q24hr. F/u stop date of 9/19. - Will follow renal function, cultures and clinical progress  Sherlon Handing, PharmD, BCPS Clinical pharmacist, pager [phone]  11/28/2013 10:58 AM

## 2013-11-28 NOTE — Progress Notes (Signed)
STROKE TEAM PROGRESS NOTE   HISTORY  63 y.o. 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 She has surrounding cytotoxic edema and midline shift and is employed left MCA aneurysm as well.    SUBJECTIVE (INTERVAL HISTORY)  No seizure activity overnight. D/c LTM EEG today. Continued on 3% and goal 145-150 to help brain edema. Still intubated for airway protection. Neuro no change since yesterday.  OBJECTIVE  Filed Vitals:   11/28/13 1400 11/28/13 1500 11/28/13 1600 11/28/13 1614  BP: 174/80 152/65 182/81 153/83  Pulse: 86 87 93 97  Temp:      TempSrc:      Resp: 29 29 29    Height:      Weight:      SpO2: 100% 100% 100% 100%    Recent Labs  Lab  12/12/2013 1900  12/05/2013 0245  11/20/13 0550  11/21/13 0500   NA  132*  128*  131*  131*   K  3.6*  3.7  3.2*  2.7*   CL  93*  92*  96  95*   CO2  23  20  22  22    GLUCOSE  142*  148*  109*  124*   BUN  10  11  10  9    CREATININE  0.99  0.98  0.82  0.72   CALCIUM  8.5  8.0*  7.2*  7.0*   MG  1.0*  --  --  --     Recent Labs  Lab  11/29/2013 1900  11/20/13 0550   AST  74*  49*   ALT  37*  24   ALKPHOS  122*  86   BILITOT  1.2  0.9   PROT  6.8  5.8*   ALBUMIN  3.3*  2.6*     Recent Labs  Lab  11/22/2013 2345  12/07/2013 0245  11/20/13 0550  11/21/13 0500   WBC  --  7.1  8.0  7.8   NEUTROABS  --  --  6.2  --   HGB  --  11.1*  9.6*  9.9*   HCT  --  32.0*  28.0*  28.6*   MCV  --  100.3*  99.6  99.3   PLT  205  186  248  215     Recent Labs   11/16/2013 1900   LABPROT  12.5   INR  0.93      Component  Value  Date/Time    CHOL  118  05/18/2012 0512    TRIG  90  11/21/2013 1900    HDL  66  05/18/2012 0512    CHOLHDL  1.8  05/18/2012 0512    VLDL  14  05/18/2012 0512    LDLCALC  38  05/18/2012 0512    Ct Angio Head W/cm &/or Wo Cm  11/14/2013  1. Unchanged appearance of extensive, predominantly right-sided subarachnoid hemorrhage as well as 2.7 cm hematoma in the region of the right insula. Moderate surrounding edema with 5 mm of unchanged midline shift. 2. No right MCA aneurysm is identified, however there is severe attenuation of the right MCA and its branch vessels which may reflect local mass effect and vasospasm secondary to surrounding hemorrhage, and this could obscure an underlying right MCA aneurysm. Narrowing of the ACAs and right ICA terminus also may reflect  vasospasm. Catheter angiography may be helpful for further evaluation. 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  Dg Chest 1 View  11/21/2013 Improved aeration of the left lung base with mild basilar atelectasis remaining  11/20/2013 Slight increase in left basilar opacity most consistent with atelectasis.  11/30/2013 No significant change in mild opacity in the lateral left lung base.  11/26/2013 Endotracheal tube and nasogastric tube in appropriate position. Stable mild opacity in lateral left lung base.  Ct Head Wo Contrast  11/25/2013 Hemorrhagic right MCA infarct is stable. 6 mm midline shift unchanged. No new findings. 11/23/2013 1. Unchanged intracranial hemorrhage as above. 2. Unchanged edema in the right temporal, frontal, and parietal lobes, consistent with right MCA infarct. Similar degree of leftward midline shift. 11/21/2013 1. No significant interval change in intraparenchymal, subarachnoid, and subdural hemorrhage as compared to 11/20/13. Associated vasogenic edema is similar with stable 4-5 mm of right-to-left midline shift. No hydrocephalus. 2. No new hemorrhage.  11/20/2013 1. No change in the intracranial hemorrhage since the most recent prior study. Right frontal parenchymal hematoma is stable in size as is the degree of mass effect. Subarachnoid hemorrhage and areas of subdural hemorrhage are stable. No new intracranial hemorrhage. 2. Mass  effect and midline shift ir stable. 3. No hydrocephalus.  12/07/2013 Similar extensive right greater than left subarachnoid blood, with a trace possible posterior falcine subdural hematoma. Evolving 2.7 x 2.4 cm right insular hematoma with similar 5 mm right to left midline shift. No hydrocephalus.  TCD  11/26/2013 Limited study due to poor windows and neck strap limiting evaluation. Low normal mean flow velocities in identified vessels of anterior circulation. Globally elevated pulsatility indices suggest increase intracranial presure or diffuse intracranial atherosclerosis.  EEG LTM 11/27/13 Clinical interpretation: This 24 hours of intensive EEG monitoring with simultaneous video monitoring did not record any clinical or subclinical seizures. Background activities were marked by background activities slowing non- reactive to external stimuli, suggestive of a severe encephalopathy of nonspecific etiologies including toxic metabolic pharmacologic multifocal degenerative etiologies. Right hemisphere background slowing was more prominent and attenuated , lacking runs of faster frequencies. These Findings are suggestive of an additional neuronal dysfunction across the right hemisphere. There is no epileptiform discharges clinical or subclinical seizures present.  Clinical correlation is advised.  PHYSICAL EXAM  Filed Vitals:   11/28/13 1400 11/28/13 1500 11/28/13 1600 11/28/13 1614  BP: 174/80 152/65 182/81 153/83  Pulse: 86 87 93 97  Temp:      TempSrc:      Resp: 29 29 29    Height:      Weight:      SpO2: 100% 100% 100% 100%    [name] intubated. Afebrile. Head is nontraumatic. Neck is supple without bruit. Cardiac exam no murmur or gallop. Lungs are coarse rhonchi bilaterally. Distal pulses are well felt.   Neurologic Examination:  Intubated and not on sedation. Unresponsive to voice, not following commands, but still responds to sternal rub and pain stimulation by localizing  with right arm, can lift to barely against gravity with RLE on pain stimulation. LUE 0/5 and LLE 2/5 on pain stimulation. Pupils are equal and reactive. Fundi were not visualized. Right gaze preference. Right plantar is downgoing left is equivocal. Coordination and gait cannot be tested.  ASSESSMENT/PLAN  Ms. SHAKIRA LOS is a 63 y.o. female with history of CHF, depression, elevated cholesterol, COPD, MI, and CAD on aspirin and plavix s/p fall where she hit her  head. Presenting to [facility] 12/10/2013 with left sided weakness, facial droop and headache. Intubated there and transferred to [facility]. Imaging confirms a right SAH, R IPH, with right MCA territory edema and hypodensity, concerning for right MCA stroke due to vasospasm. Developed partial seizure episodes, increased depakote and load with vimpat. Hyponatremia with likely CNS salt wasting, on 3% saline. Intubated due to airway protection. Na up to 126 today from 119 yesterday.  Hemorrhage: R insular IPH with R perisylvian SAH & R SDH likely from R MCA aneurysm not seen on angio, now with new neurological deficits likely related to vasospasm with vasogenic edema and mass effect  aspirin 81 mg orally every day and clopidogrel 75 mg orally every day prior to admission, held now secondary to IPH Vasospasm confirmed on angiogram and treated with verapamil IA.  Concerning for right MCA stroke due to vasospasm Repeat CT stable lovenox for VTE prophylaxis  NPO  Bedrest  Resultant Left hemiplegia  Respiratory failure - intubated on no sedation - vent per CCM  Partial seizure - left UE shaking episodes, still intermittent overnight - depakote increased to 750mg  tid - increase vimpat maintenance dose to 100mg  bid - depakote level 86.2 today, stable  - d/c LTM EEG  Hyponatremia - Na 119 -> 126 -> 133->140 - Urine Na 80 - likely salt wasting - on 3% saline at 35cc/h - BMP q4h - goal 145-150 to help brain edema  Cerebral  vasospasm - on nimodipine - s/p IA verapamil - triple H therapy, on neo - NSG on board  Hypertension  - part of the triple-H therapy now - on neo for desired hypertension - NSG on board - hemorrhage so far stable - goal 160-180  Hyperlipidemia  Home meds: pravachol. 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 Coronary artery disease - MI, angioplasty 2013 Ischemic cardiomyopathy  Other Pertinent History  AAA, infrarenal  R hepatic lobe lesion   Hospital day # 3   This patient is critically ill due to right MCA infarct due to seizure, vasospasm which is difficult to control, right ICH with SAH due to aneurysm rupture, and at significant risk of neurological worsening, death and care requires constant monitoring of vital signs, hemodynamics,respiratory and cardiac monitoring,review of multiple databases, neurological assessment, discussion with family, other specialists and medical decision making of high complexity.I have made any additions or clarifications directly to the above note. I spent 35 minutes of neurocritical care time in the care of this patient.  Rosalin Hawking, MD PhD Stroke Neurology 11/28/2013 5:09 PM   To contact Stroke Continuity provider, please refer to [email or web address]  After hours, contact General Neurology

## 2013-11-29 ENCOUNTER — Inpatient Hospital Stay (HOSPITAL_COMMUNITY): Payer: Medicare Other

## 2013-11-29 LAB — BASIC METABOLIC PANEL
Anion gap: 16 — ABNORMAL HIGH (ref 5–15)
BUN: 28 mg/dL — ABNORMAL HIGH (ref 6–23)
CO2: 18 meq/L — AB (ref 19–32)
Calcium: 9.2 mg/dL (ref 8.4–10.5)
Chloride: 108 mEq/L (ref 96–112)
Creatinine, Ser: 0.77 mg/dL (ref 0.50–1.10)
GFR calc Af Amer: >90 mL/min (ref >90)
GFR calc non Af Amer: 86 mL/min — ABNORMAL LOW (ref >90)
GLUCOSE: 151 mg/dL — AB (ref 70–99)
POTASSIUM: 3.6 meq/L — AB (ref 3.7–5.3)
SODIUM: 142 meq/L (ref 137–147)

## 2013-11-29 LAB — CBC
HCT: 31.8 % — ABNORMAL LOW (ref 36.0–46.0)
HEMOGLOBIN: 10.5 g/dL — AB (ref 12.0–15.0)
MCH: 34.4 pg — ABNORMAL HIGH (ref 26.0–34.0)
MCHC: 33 g/dL (ref 30.0–36.0)
MCV: 104.3 fL — ABNORMAL HIGH (ref 78.0–100.0)
Platelets: 253 10*3/uL (ref 150–400)
RBC: 3.05 MIL/uL — ABNORMAL LOW (ref 3.87–5.11)
RDW: 14 % (ref 11.5–15.5)
WBC: 15.3 10*3/uL — ABNORMAL HIGH (ref 4.0–10.5)

## 2013-11-29 LAB — GLUCOSE, CAPILLARY
GLUCOSE-CAPILLARY: 115 mg/dL — AB (ref 70–99)
GLUCOSE-CAPILLARY: 127 mg/dL — AB (ref 70–99)
GLUCOSE-CAPILLARY: 136 mg/dL — AB (ref 70–99)
GLUCOSE-CAPILLARY: 157 mg/dL — AB (ref 70–99)
GLUCOSE-CAPILLARY: 172 mg/dL — AB (ref 70–99)
GLUCOSE-CAPILLARY: 98 mg/dL (ref 70–99)
Glucose-Capillary: 142 mg/dL — ABNORMAL HIGH (ref 70–99)

## 2013-11-29 LAB — PHOSPHORUS: PHOSPHORUS: 3.4 mg/dL (ref 2.3–4.6)

## 2013-11-29 LAB — MAGNESIUM: MAGNESIUM: 1.9 mg/dL (ref 1.5–2.5)

## 2013-11-29 MED ORDER — LOPERAMIDE HCL 1 MG/5ML PO LIQD
4.0000 mg | Freq: Once | ORAL | Status: AC
  Administered 2013-11-29: 4 mg
  Filled 2013-11-29: qty 20

## 2013-11-29 MED ORDER — LOPERAMIDE HCL 1 MG/5ML PO LIQD
2.0000 mg | ORAL | Status: DC | PRN
  Administered 2013-11-30 – 2013-12-06 (×7): 2 mg via ORAL
  Filled 2013-11-29 (×10): qty 10

## 2013-11-29 MED ORDER — SODIUM CHLORIDE 0.9 % IV BOLUS (SEPSIS)
500.0000 mL | Freq: Once | INTRAVENOUS | Status: AC
  Administered 2013-11-29: 500 mL via INTRAVENOUS

## 2013-11-29 NOTE — Progress Notes (Signed)
UR completed.  Airam Heidecker, RN BSN MHA CCM Trauma/Neuro ICU Case Manager [phone]  

## 2013-11-29 NOTE — Progress Notes (Signed)
STROKE TEAM PROGRESS NOTE   HISTORY  63 y.o. 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 She has surrounding cytotoxic edema and midline shift and is employed left MCA aneurysm as well.    SUBJECTIVE (INTERVAL HISTORY)  No seizure activity overnight. Continued on 3% and goal 145-150 to help brain edema. Still intubated for airway protection. Neuro no change.  OBJECTIVE  Filed Vitals:   11/29/13 1500 11/29/13 1530 11/29/13 1540 11/29/13 1601  BP: 178/73 172/82    Pulse: 133 113 108   Temp:    98.7 F (37.1 C)  TempSrc:    Axillary  Resp: 30 31 29    Height:      Weight:      SpO2: 100% 100% 100%     Recent Labs  Lab  11/22/2013 1900  11/18/2013 0245  11/20/13 0550  11/21/13 0500   NA  132*  128*  131*  131*   K  3.6*  3.7  3.2*  2.7*   CL  93*  92*  96  95*   CO2  23  20  22  22    GLUCOSE  142*  148*  109*  124*   BUN  10  11  10  9    CREATININE  0.99  0.98  0.82  0.72   CALCIUM  8.5  8.0*  7.2*  7.0*   MG  1.0*  --  --  --     Recent Labs  Lab  11/20/2013 1900  11/20/13 0550   AST  74*  49*   ALT  37*  24   ALKPHOS  122*  86   BILITOT  1.2  0.9   PROT  6.8  5.8*   ALBUMIN  3.3*  2.6*     Recent Labs  Lab  11/26/2013 2345  11/14/2013 0245  11/20/13 0550  11/21/13 0500   WBC  --  7.1  8.0  7.8   NEUTROABS  --  --  6.2  --   HGB  --  11.1*  9.6*  9.9*   HCT  --  32.0*  28.0*  28.6*   MCV  --  100.3*  99.6  99.3   PLT  205  186  248  215     Recent Labs   12/09/2013 1900   LABPROT  12.5   INR  0.93      Component  Value  Date/Time    CHOL  118  05/18/2012 0512    TRIG  90  11/27/2013 1900    HDL  66  05/18/2012 0512    CHOLHDL  1.8  05/18/2012 0512    VLDL  14  05/18/2012 0512    LDLCALC  38  05/18/2012 0512    Ct Angio Head W/cm &/or Wo Cm  11/17/2013 1. Unchanged appearance  of extensive, predominantly right-sided subarachnoid hemorrhage as well as 2.7 cm hematoma in the region of the right insula. Moderate surrounding edema with 5 mm of unchanged midline shift. 2. No right MCA aneurysm is identified, however there is severe attenuation of the right MCA and its branch vessels which may reflect local mass effect and vasospasm secondary to surrounding hemorrhage, and this could obscure an underlying right MCA aneurysm. Narrowing of the ACAs and right ICA terminus also may reflect vasospasm. Catheter angiography  may be helpful for further evaluation. 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  Dg Chest 1 View  11/21/2013 Improved aeration of the left lung base with mild basilar atelectasis remaining  11/20/2013 Slight increase in left basilar opacity most consistent with atelectasis.  11/30/2013 No significant change in mild opacity in the lateral left lung base.  11/15/2013 Endotracheal tube and nasogastric tube in appropriate position. Stable mild opacity in lateral left lung base.  Ct Head Wo Contrast  11/25/2013 Hemorrhagic right MCA infarct is stable. 6 mm midline shift unchanged. No new findings. 11/23/2013 1. Unchanged intracranial hemorrhage as above. 2. Unchanged edema in the right temporal, frontal, and parietal lobes, consistent with right MCA infarct. Similar degree of leftward midline shift. 11/21/2013 1. No significant interval change in intraparenchymal, subarachnoid, and subdural hemorrhage as compared to 11/20/13. Associated vasogenic edema is similar with stable 4-5 mm of right-to-left midline shift. No hydrocephalus. 2. No new hemorrhage.  11/20/2013 1. No change in the intracranial hemorrhage since the most recent prior study. Right frontal parenchymal hematoma is stable in size as is the degree of mass effect. Subarachnoid hemorrhage and areas of subdural hemorrhage are stable. No new intracranial hemorrhage. 2. Mass effect and midline shift  ir stable. 3. No hydrocephalus.  12/07/2013 Similar extensive right greater than left subarachnoid blood, with a trace possible posterior falcine subdural hematoma. Evolving 2.7 x 2.4 cm right insular hematoma with similar 5 mm right to left midline shift. No hydrocephalus.  TCD  12/04/2013 Limited study due to poor windows and neck strap limiting evaluation. Low normal mean flow velocities in identified vessels of anterior circulation. Globally elevated pulsatility indices suggest increase intracranial presure or diffuse intracranial atherosclerosis.  EEG LTM 11/27/13 Clinical interpretation: This 24 hours of intensive EEG monitoring with simultaneous video monitoring did not record any clinical or subclinical seizures. Background activities were marked by background activities slowing non- reactive to external stimuli, suggestive of a severe encephalopathy of nonspecific etiologies including toxic metabolic pharmacologic multifocal degenerative etiologies. Right hemisphere background slowing was more prominent and attenuated , lacking runs of faster frequencies. These Findings are suggestive of an additional neuronal dysfunction across the right hemisphere. There is no epileptiform discharges clinical or subclinical seizures present.  Clinical correlation is advised.  PHYSICAL EXAM  Filed Vitals:   11/29/13 1500 11/29/13 1530 11/29/13 1540 11/29/13 1601  BP: 178/73 172/82    Pulse: 133 113 108   Temp:    98.7 F (37.1 C)  TempSrc:    Axillary  Resp: 30 31 29    Height:      Weight:      SpO2: 100% 100% 100%     [name] intubated. Afebrile. Head is nontraumatic. Neck is supple without bruit. Cardiac exam no murmur or gallop. Lungs are coarse rhonchi bilaterally. Distal pulses are well felt.   Neurologic Examination:  Intubated and not on sedation. Unresponsive to voice, not following commands, but still responds to sternal rub and pain stimulation by localizing with right arm,  can lift to barely against gravity with RLE on pain stimulation. LUE 0/5 and LLE 2/5 on pain stimulation. Pupils are equal and reactive. Fundi were not visualized. Right gaze preference. Right plantar is downgoing left is equivocal. Coordination and gait cannot be tested.  ASSESSMENT/PLAN  [name] is a 63 y.o. female with history of CHF, depression, elevated cholesterol, COPD, MI, and CAD on aspirin and plavix s/p fall where she hit her  head. Presenting to [facility] 12/09/2013 with left sided weakness, facial droop and headache. Intubated there and transferred to [facility]. Imaging confirms a right SAH, R IPH, with right MCA territory edema and hypodensity, concerning for right MCA stroke due to vasospasm. Developed partial seizure episodes, increased depakote and load with vimpat. Hyponatremia with likely CNS salt wasting, on 3% saline. Intubated due to airway protection. Na up to 126 today from 119 yesterday.  Hemorrhage: R insular IPH with R perisylvian SAH & R SDH likely from R MCA aneurysm not seen on angio, now with new neurological deficits likely related to vasospasm with vasogenic edema and mass effect  aspirin 81 mg orally every day and clopidogrel 75 mg orally every day prior to admission, held now secondary to IPH Vasospasm confirmed on angiogram and treated with verapamil IA.  Concerning for right MCA stroke due to vasospasm Repeat CT stable lovenox for VTE prophylaxis  NPO  Bedrest  Resultant Left hemiplegia  Respiratory failure - intubated on no sedation - vent per CCM  Partial seizure - left UE shaking episodes, still intermittent overnight - depakote increased to 750mg  tid - increase vimpat maintenance dose to 100mg  bid - depakote level 86.2 today, stable  - d/c LTM EEG  Hyponatremia - Na 119 -> 126 -> 133 -> 140 -> 142 - Urine Na 80 - likely salt wasting - on 3% saline at 35cc/h - BMP q4h - goal 145-150 to help brain edema  Cerebral vasospasm - on  nimodipine - s/p IA verapamil - triple H therapy, on neo - NSG on board  Hypertension  - part of the triple-H therapy now - on neo for desired hypertension - NSG on board - hemorrhage so far stable - goal 160-180  Hyperlipidemia  Home meds: pravachol. 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 Coronary artery disease - MI, angioplasty 2013 Ischemic cardiomyopathy  Other Pertinent History  AAA, infrarenal  R hepatic lobe lesion   Hospital day # 3   This patient is critically ill due to right MCA infarct due to seizure, vasospasm which is difficult to control, right ICH with SAH due to aneurysm rupture, and at significant risk of neurological worsening, death and care requires constant monitoring of vital signs, hemodynamics,respiratory and cardiac monitoring,review of multiple databases, neurological assessment, discussion with family, other specialists and medical decision making of high complexity.I have made any additions or clarifications directly to the above note. I spent 35 minutes of neurocritical care time in the care of this patient.  Rosalin Hawking, MD PhD Stroke Neurology 11/29/2013 5:09 PM   To contact Stroke Continuity provider, please refer to [email or web address]  After hours, contact General Neurology

## 2013-11-29 NOTE — Progress Notes (Signed)
Subjective: Patient reports (vent support)  Objective: Vital signs in last 24 hours: Temp:  [99.4 F (37.4 C)-100.9 F (38.3 C)] 99.5 F (37.5 C) (09/17 0409) Pulse Rate:  [72-109] 87 (09/17 0734) Resp:  [21-33] 27 (09/17 0734) BP: (120-187)/(51-107) 142/51 mmHg (09/17 0700) SpO2:  [100 %] 100 % (09/17 0734) Arterial Line BP: (74-194)/(46-102) 140/46 mmHg (09/17 0700) FiO2 (%):  [30 %] 30 % (09/17 0734) Weight:  [67.4 kg (148 lb 9.4 oz)-68.3 kg (150 lb 9.2 oz)] 67.4 kg (148 lb 9.4 oz) (09/17 0500)  Intake/Output from previous day: 09/16 0701 - 09/17 0700 In: 4058.1 [I.V.:2468.1; NG/GT:1180; IV Piggyback:410] Out: 6283 [Urine:3225; Stool:425] Intake/Output this shift:    Remains unresponsive. PEARL. Withdraws only right foot to touch. No seizure activity reported overnight. No change. Na 142, K 3.6.  Lab Results:  Recent Labs  11/28/13 0600 11/29/13 0630  WBC 12.1* 15.3*  HGB 10.6* 10.5*  HCT 31.4* 31.8*  PLT 272 253   BMET  Recent Labs  11/28/13 1800 11/29/13 0630  NA 141 142  K 3.3* 3.6*  CL 107 108  CO2 18* 18*  GLUCOSE 139* 151*  BUN 26* 28*  CREATININE 0.73 0.77  CALCIUM 9.1 9.2    Studies/Results: Dg Chest Port 1 View  11/28/2013   CLINICAL DATA:  Pneumonitis  EXAM: PORTABLE CHEST - 1 VIEW  COMPARISON:  11/27/2013; 11/26/2013; 11/25/2013  FINDINGS: Grossly unchanged cardiac silhouette and mediastinal contours given kyphotic projection and patient rotation to the right. Grossly unchanged bibasilar heterogeneous opacities, right greater than left. Trace bilateral effusions are not excluded. No definite evidence of edema. No pneumothorax. Unchanged bones.  IMPRESSION: 1.  Stable positioning of support apparatus.  No pneumothorax. 2. Grossly unchanged bibasilar opacities, right greater than left, atelectasis versus infiltrate.   Electronically Signed   By: Sandi Mariscal M.D.   On: 11/28/2013 08:22    Assessment/Plan:   LOS: 11 days  Continue support.   Holding on angiogram.    Verdis Prime 11/29/2013, 7:40 AM

## 2013-11-29 NOTE — Progress Notes (Signed)
PULMONARY / CRITICAL CARE MEDICINE   Name: Carla Hoover MRN: [id number] DOB: 1950/12/13    ADMISSION DATE:  11/22/2013 CONSULTATION DATE:  11/18/2013  REFERRING MD :  Erline Levine  CHIEF COMPLAINT:  Weakness  INITIAL PRESENTATION:  63 yo female fell and hit her head one day prior to admit.  Presented to [place] with Lt sided weakness and facial drop.  CT head showed ICH with SAH.  She required intubation prior to transfer to [facility].  PCCM consulted for vent/medical management.  STUDIES:  9/06 CT head ([place]) 9/07 Cerebral arteriogram >> 7 mm Lt MCA Aneurysm, vasospasm of Rt MCA 9/09 CT head >> no change in ICH, SAH, SDH 9/11 CT head >> unchanged ICH or MCA infarct changes 9/13 head CT- 6 mm shift, no change in ICH  SIGNIFICANT EVENTS: 9/06 Transfer from [place] to [facility] 9/11 More lethargic, Lt side weaker >> start [facility] therapy  9/13 ETT placed, hyponatemia  SUBJECTIVE:  Afebrile No sedative gtt CVP 5 Excellent UO - on NS   VITAL SIGNS: Temp:  [99.3 F (37.4 C)-100.9 F (38.3 C)] 99.3 F (37.4 C) (09/17 0800) Pulse Rate:  [72-109] 80 (09/17 0800) Resp:  [21-33] 27 (09/17 0800) BP: (120-187)/(51-107) 145/80 mmHg (09/17 0800) SpO2:  [100 %] 100 % (09/17 0800) Arterial Line BP: (74-194)/(46-102) 171/60 mmHg (09/17 0800) FiO2 (%):  [30 %] 30 % (09/17 0800) Weight:  [148 lb 9.4 oz (67.4 kg)-150 lb 9.2 oz (68.3 kg)] 148 lb 9.4 oz (67.4 kg) (09/17 0500) INTAKE / OUTPUT:  Intake/Output Summary (Last 24 hours) at 11/29/13 0908 Last data filed at 11/29/13 0800  Gross per 24 hour  Intake 3905.65 ml  Output   3500 ml  Net 405.65 ml    PHYSICAL EXAMINATION:  Gen: acutely ill, intubated, RASS-5 HEENT NG tube PULM:clear BL CV: RRR, no mgr AB: BS+, soft, nontender Ext: warm no edema Neuro:moves extremitties to  pain, pupils dilated reactive BL, lt plantar upgoing,does not follow commands  LABS:  CBC  Recent Labs Lab 11/27/13 0600 11/28/13 0600  11/29/13 0630  WBC 11.1* 12.1* 15.3*  HGB 11.1* 10.6* 10.5*  HCT 31.8* 31.4* 31.8*  PLT 271 272 253   Coag's No results found for this basename: APTT, INR,  in the last 168 hours BMET  Recent Labs Lab 11/28/13 1200 11/28/13 1800 11/29/13 0630  NA 140 141 142  K 4.9 3.3* 3.6*  CL 110 107 108  CO2 18* 18* 18*  BUN 22 26* 28*  CREATININE 0.70 0.73 0.77  GLUCOSE 172* 139* 151*   Electrolytes  Recent Labs Lab 11/24/13 0438  11/26/13 0435  11/27/13 0600  11/28/13 1200 11/28/13 1800 11/29/13 0630  CALCIUM 7.3*  < > 7.7*  < > 8.4  < > 8.6 9.1 9.2  MG 0.9*  < > 2.7*  --  1.6  --   --   --  1.9  PHOS 1.8*  --   --   --  2.7  --   --   --  3.4  < > = values in this interval not displayed.  ABG  Recent Labs Lab 11/24/13 0528 11/25/13 1540  PHART 7.517* 7.458*  PCO2ART 28.6* 26.6*  PO2ART 61.1* 130.0*   Liver Enzymes  Recent Labs Lab 11/26/13 0435  AST 86*  ALT 48*  ALKPHOS 88  BILITOT 0.3  ALBUMIN 2.2*   Imaging Dg Chest Port 1 View  11/28/2013   CLINICAL DATA:  Pneumonitis  EXAM: PORTABLE CHEST -  1 VIEW  COMPARISON:  11/27/2013; 11/26/2013; 11/25/2013  FINDINGS: Grossly unchanged cardiac silhouette and mediastinal contours given kyphotic projection and patient rotation to the right. Grossly unchanged bibasilar heterogeneous opacities, right greater than left. Trace bilateral effusions are not excluded. No definite evidence of edema. No pneumothorax. Unchanged bones.  IMPRESSION: 1.  Stable positioning of support apparatus.  No pneumothorax. 2. Grossly unchanged bibasilar opacities, right greater than left, atelectasis versus infiltrate.   Electronically Signed   By: Sandi Mariscal M.D.   On: 11/28/2013 08:22     ASSESSMENT / PLAN: NEUROLOGIC A:   ICH with SAH with concern for vasospasm/MCA stroke 9/11 Hx of anxiety, depression, insomnia  Hx of ETOH abuse Seizure likely, cEEG did not show P:   -Valproate for seizure prevention per neurology -Nimotop per  neurosurgery -Continue simvastatin -Continue HHH therapy >> goal SBP 160 to 180 with neo gtt - cvp to goal 10-12, increase NS @ 125/h -Intermittent TCD's -Continue prozac -Thiamine, folic acid   PULMONARY OETT 9/06 >> 9/09, 9/11 >> A: Acute respiratory failure in setting of ICH Bibasilar Atelectasis with mild hypoxemia Hx of COPD, tobacco abuse P:   - Oxygen to keep SpO2 > 92% - PRN albuterol -SBTs but no extubation   CARDIOVASCULAR aline 9/11 >> CVL 9/11 >> A:  Hx of CAD, hyperlipidemia, HTN, chronic systolic heart failure, AAA. P:  -Hold ASA, plavix in setting of ICH until okay with neurology/neurosurgery -Hold lopressor, imdur, lasix while getting HHH therapy -neo as needed for goal SBP 160-180 vs MAP 80-105 -nimodipine x 21 days -Continue simvastatin (uses pravachol as outpt)   RENAL A:   Hyponatremia Decline in Na with associated seizures  R/o siadh (as limited progress and na drop on nacl), doubt CSW P:   ct florinef NS @ 125/h to goal CVp 10-12 bmet daily   GASTROINTESTINAL A:   Protein calorie malnutrition Dysphagia Hx of IBS Diarrhea, c diff neg  P:   -Protonix for SUP -Panda tube for meds, TFs -imodium ok  HEMATOLOGIC A:   Anemia of critical illness. P:  -F/u CBC  INFECTIOUS A:   Asp likely P:   -ct  Levo x 7ds total ( pcn all) Sputum cx 9/13 >> ng  ENDOCRINE A:   Hyperglycemia   P:   - ssi  TODAY'S [place] therapy for vasospasm post SAH, prognosis guarded given worsening neuro exam, need to discuss plan with family   Care during the described time interval was provided by me and/or other providers on the critical care team.  I have reviewed this patient's available data, including medical history, events of note, physical examination and test results as part of my evaluation  CC time x 29m  Kara Mead MD. [place] Flood. [place] [facility] Pager [phone] If no response call [phone]

## 2013-11-30 ENCOUNTER — Inpatient Hospital Stay (HOSPITAL_COMMUNITY): Payer: Medicare Other

## 2013-11-30 LAB — CBC
HCT: 29.4 % — ABNORMAL LOW (ref 36.0–46.0)
Hemoglobin: 9.9 g/dL — ABNORMAL LOW (ref 12.0–15.0)
MCH: 34.4 pg — AB (ref 26.0–34.0)
MCHC: 33.7 g/dL (ref 30.0–36.0)
MCV: 102.1 fL — ABNORMAL HIGH (ref 78.0–100.0)
PLATELETS: 233 10*3/uL (ref 150–400)
RBC: 2.88 MIL/uL — AB (ref 3.87–5.11)
RDW: 13.7 % (ref 11.5–15.5)
WBC: 17.6 10*3/uL — ABNORMAL HIGH (ref 4.0–10.5)

## 2013-11-30 LAB — BASIC METABOLIC PANEL
Anion gap: 15 (ref 5–15)
BUN: 27 mg/dL — AB (ref 6–23)
CO2: 20 mEq/L (ref 19–32)
Calcium: 8.6 mg/dL (ref 8.4–10.5)
Chloride: 105 mEq/L (ref 96–112)
Creatinine, Ser: 0.66 mg/dL (ref 0.50–1.10)
Glucose, Bld: 144 mg/dL — ABNORMAL HIGH (ref 70–99)
Potassium: 2.9 mEq/L — CL (ref 3.7–5.3)
SODIUM: 140 meq/L (ref 137–147)

## 2013-11-30 LAB — GLUCOSE, CAPILLARY
GLUCOSE-CAPILLARY: 126 mg/dL — AB (ref 70–99)
GLUCOSE-CAPILLARY: 131 mg/dL — AB (ref 70–99)
Glucose-Capillary: 115 mg/dL — ABNORMAL HIGH (ref 70–99)
Glucose-Capillary: 121 mg/dL — ABNORMAL HIGH (ref 70–99)
Glucose-Capillary: 126 mg/dL — ABNORMAL HIGH (ref 70–99)
Glucose-Capillary: 128 mg/dL — ABNORMAL HIGH (ref 70–99)
Glucose-Capillary: 136 mg/dL — ABNORMAL HIGH (ref 70–99)

## 2013-11-30 LAB — PHOSPHORUS: Phosphorus: 3.5 mg/dL (ref 2.3–4.6)

## 2013-11-30 LAB — MAGNESIUM: Magnesium: 1.3 mg/dL — ABNORMAL LOW (ref 1.5–2.5)

## 2013-11-30 MED ORDER — POTASSIUM CHLORIDE 20 MEQ/15ML (10%) PO LIQD
20.0000 meq | ORAL | Status: DC
  Administered 2013-11-30: 20 meq
  Filled 2013-11-30: qty 15

## 2013-11-30 MED ORDER — POTASSIUM CHLORIDE 20 MEQ/15ML (10%) PO LIQD
40.0000 meq | ORAL | Status: AC
  Administered 2013-11-30: 40 meq
  Filled 2013-11-30: qty 30

## 2013-11-30 MED ORDER — MAGNESIUM SULFATE 4000MG/100ML IJ SOLN
4.0000 g | Freq: Once | INTRAMUSCULAR | Status: AC
  Administered 2013-11-30: 4 g via INTRAVENOUS
  Filled 2013-11-30: qty 100

## 2013-11-30 NOTE — Progress Notes (Signed)
[place] ICU Electrolyte Replacement Protocol  Patient Name: Carla Hoover DOB: [birth year] MRN: [id number]  Date of Service  11/30/2013   HPI/Events of Note    Recent Labs Lab 11/24/13 0438 11/25/13 0600  11/26/13 0435  11/27/13 0600  11/28/13 0238 11/28/13 0600 11/28/13 1200 11/28/13 1800 11/29/13 0630  NA 124* 119*  < > 126*  < > 133*  < > 142 140 140 141 142  K 3.3* 3.6*  < > 3.0*  < > 3.9  < > 3.8 3.3* 4.9 3.3* 3.6*  CL 88* 84*  < > 92*  < > 99  < > 106 106 110 107 108  CO2 21 18*  < > 17*  < > 18*  < > 20 20 18* 18* 18*  GLUCOSE 141* 130*  < > 159*  < > 145*  < > 132* 129* 172* 139* 151*  BUN 8 9  < > 11  < > 14  < > 21 20 22  26* 28*  CREATININE 0.60 0.55  < > 0.71  < > 0.67  < > 0.73 0.71 0.70 0.73 0.77  CALCIUM 7.3* 8.1*  < > 7.7*  < > 8.4  < > 8.9 8.9 8.6 9.1 9.2  MG 0.9* 1.3*  --  2.7*  --  1.6  --   --   --   --   --  1.9  PHOS 1.8*  --   --   --   --  2.7  --   --   --   --   --  3.4  < > = values in this interval not displayed.  Estimated Creatinine Clearance: 67 ml/min (by C-G formula based on Cr of 0.77).  Intake/Output     09/17 0701 - 09/18 0700   I.V. (mL/kg) 3371.9 (48.8)   NG/GT 1280   IV Piggyback 867.5   Total Intake(mL/kg) 5519.4 (79.9)   Urine (mL/kg/hr) 2900 (1.7)   Stool 250 (0.2)   Total Output 3150   Net +2369.4       Stool Occurrence 3 x    - I/O DETAILED x24h    Total I/O In: 2611.3 [I.V.:1916.3; NG/GT:570; IV Piggyback:125] Out: 1500 [Urine:1400; Stool:100] - I/O THIS SHIFT    ASSESSMENT   eICURN Interventions  K+ 3.6 ICU Electrolyte Replacement Protocol criteria met. Labs Replaced per protocol. MD notified   ASSESSMENT: MAJOR ELECTROLYTE    Carla Hoover 11/30/2013, 6:29 AM

## 2013-11-30 NOTE — Progress Notes (Signed)
Pt seen and examined. No issues overnight.   EXAM: Temp:  [98.7 F (37.1 C)-100 F (37.8 C)] 99.7 F (37.6 C) (09/18 0800) Pulse Rate:  [68-167] 85 (09/18 1509) Resp:  [16-32] 16 (09/18 1509) BP: (123-175)/(48-88) 159/59 mmHg (09/18 1400) SpO2:  [100 %] 100 % (09/18 1509) FiO2 (%):  [30 %] 30 % (09/18 1509) Weight:  [69.1 kg (152 lb 5.4 oz)] 69.1 kg (152 lb 5.4 oz) (09/18 0440) Intake/Output     09/17 0701 - 09/18 0700 09/18 0701 - 09/19 0700   I.V. (mL/kg) 3705.7 (53.6) 565.5 (8.2)   NG/GT 1360 80   IV Piggyback 867.5 135   Total Intake(mL/kg) 5933.2 (85.9) 780.5 (11.3)   Urine (mL/kg/hr) 2900 (1.7) 1200 (2)   Stool 250 (0.2)    Total Output 3150 1200   Net +2783.2 -419.5        Stool Occurrence 3 x     Intubated No eye opening to pain Minimal w/d with RUE to central pain No movement LUE/LLE to pain (+) cough (+) gag  LABS: Lab Results  Component Value Date   CREATININE 0.66 11/30/2013   BUN 27* 11/30/2013   NA 140 11/30/2013   K 2.9* 11/30/2013   CL 105 11/30/2013   CO2 20 11/30/2013   Lab Results  Component Value Date   WBC 17.6* 11/30/2013   HGB 9.9* 11/30/2013   HCT 29.4* 11/30/2013   MCV 102.1* 11/30/2013   PLT 233 11/30/2013    IMPRESSION: - 63 y.o. female with SAH and very poor neurologic exam. Her poor exam does not appear to be related to sepsis, SZ, electrolyte abnormality, or HCP. Her exam has been poor for several days without sign of improvement. I therefore believe her chance for meaningful neurologic recovery is likely very poor.  PLAN: - Family meeting scheduled tomorrow - Cont current level of supportive care

## 2013-11-30 NOTE — Progress Notes (Signed)
PULMONARY / CRITICAL CARE MEDICINE   Name: Carla Hoover MRN: [id number] DOB: [birth year]    ADMISSION DATE:  11/29/2013 CONSULTATION DATE:  11/18/2013  REFERRING MD :  Erline Levine  CHIEF COMPLAINT:  Weakness  INITIAL PRESENTATION:  63 yo female fell and hit her head one day prior to admit.  Presented to [place] with Lt sided weakness and facial drop.  CT head showed ICH with SAH.  She required intubation prior to transfer to [facility].  PCCM consulted for vent/medical management.  STUDIES:  9/06 CT head ([place]) 9/07 Cerebral arteriogram >> 7 mm Lt MCA Aneurysm, vasospasm of Rt MCA 9/09 CT head >> no change in ICH, SAH, SDH 9/11 CT head >> unchanged ICH or MCA infarct changes 9/13 head CT- 6 mm shift, no change in ICH  SIGNIFICANT EVENTS: 9/06 Transfer from [place] to [facility] 9/11 More lethargic, Lt side weaker >> start [facility] therapy  9/13 ETT placed, hyponatemia  SUBJECTIVE:  Afebrile Remains comatose off sedative gtt CVP 6 POs balance- on NS   VITAL SIGNS: Temp:  [98.7 F (37.1 C)-100 F (37.8 C)] 99.7 F (37.6 C) (09/18 0800) Pulse Rate:  [62-167] 82 (09/18 0721) Resp:  [23-32] 30 (09/18 0721) BP: (123-189)/(48-97) 168/68 mmHg (09/18 0600) SpO2:  [100 %] 100 % (09/18 0721) FiO2 (%):  [30 %] 30 % (09/18 0800) Weight:  [69.1 kg (152 lb 5.4 oz)] 69.1 kg (152 lb 5.4 oz) (09/18 0440) INTAKE / OUTPUT:  Intake/Output Summary (Last 24 hours) at 11/30/13 0954 Last data filed at 11/30/13 0900  Gross per 24 hour  Intake 6176.21 ml  Output   3525 ml  Net 2651.21 ml    PHYSICAL EXAMINATION:  Gen: acutely ill, intubated, RASS-5 HEENT NG tube PULM:clear BL CV: RRR, no mgr AB: BS+, soft, nontender Ext: warm no edema Neuro:moves extremitties to  pain, pupils dilated reactive BL, lt plantar upgoing,does not follow commands  LABS:  CBC  Recent Labs Lab 11/28/13 0600 11/29/13 0630 11/30/13 0615  WBC 12.1* 15.3* 17.6*  HGB 10.6* 10.5* 9.9*  HCT 31.4* 31.8*  29.4*  PLT 272 253 233   Coag's No results found for this basename: APTT, INR,  in the last 168 hours BMET  Recent Labs Lab 11/28/13 1800 11/29/13 0630 11/30/13 0615  NA 141 142 140  K 3.3* 3.6* 2.9*  CL 107 108 105  CO2 18* 18* 20  BUN 26* 28* 27*  CREATININE 0.73 0.77 0.66  GLUCOSE 139* 151* 144*   Electrolytes  Recent Labs Lab 11/27/13 0600  11/28/13 1800 11/29/13 0630 11/30/13 0615  CALCIUM 8.4  < > 9.1 9.2 8.6  MG 1.6  --   --  1.9 1.3*  PHOS 2.7  --   --  3.4 3.5  < > = values in this interval not displayed.  ABG  Recent Labs Lab 11/24/13 0528 11/25/13 1540  PHART 7.517* 7.458*  PCO2ART 28.6* 26.6*  PO2ART 61.1* 130.0*   Liver Enzymes  Recent Labs Lab 11/26/13 0435  AST 86*  ALT 48*  ALKPHOS 88  BILITOT 0.3  ALBUMIN 2.2*   Imaging Dg Chest Port 1 View  11/29/2013   CLINICAL DATA:  Pneumonitis  EXAM: PORTABLE CHEST - 1 VIEW  COMPARISON:  11/28/2013  FINDINGS: Cardiomediastinal silhouette is stable. Stable support apparatus. Probable trace left pleural effusion. Small bilateral basilar atelectasis or infiltrate. No pulmonary edema.  IMPRESSION: Stable support apparatus. Again noted bilateral basilar atelectasis or infiltrate. Probable trace left pleural effusion.  Electronically Signed   By: Lahoma Crocker M.D.   On: 11/29/2013 08:09     ASSESSMENT / PLAN: NEUROLOGIC A:   ICH with SAH with concern for vasospasm/MCA stroke 9/11 Hx of anxiety, depression, insomnia  Hx of ETOH abuse Seizure likely, cEEG did not show P:   -Valproate for seizure prevention per neurology --nimodipine x 21 days -Continue simvastatin -Continue HHH therapy >> goal SBP 160 to 180 with neo gtt - cvp to goal 10,ct NS @ 125/h -Intermittent TCD's -Continue prozac -Thiamine, folic acid -Unfortunately mental status has not improved   PULMONARY OETT 9/06 >> 9/09, 9/11 >> A: Acute respiratory failure in setting of ICH Bibasilar Atelectasis with mild hypoxemia Hx of  COPD, tobacco abuse P:   - Oxygen to keep SpO2 > 92% - PRN albuterol -SBTs but no extubation   CARDIOVASCULAR aline 9/11 >> CVL 9/11 >> A:  Hx of CAD, hyperlipidemia, HTN, chronic systolic heart failure, AAA. P:  -Hold ASA, plavix in setting of ICH until okay with neurology/neurosurgery -Hold lopressor, imdur, lasix while getting HHH therapy -neo as needed for goal SBP 160-180 vs MAP 80-105 -Continue simvastatin (uses pravachol as outpt)   RENAL A:   Hyponatremia Decline in Na with associated seizures  R/o siadh (as limited progress and na drop on nacl), doubt CSW Hypokaleia/hypomag P:   ct florinef NS @ 125/h to goal CVp 10 bmet daily Replete lytes   GASTROINTESTINAL A:   Protein calorie malnutrition Dysphagia Hx of IBS Diarrhea, c diff neg  P:   -Protonix for SUP -Panda tube for meds, TFs -imodium ok  HEMATOLOGIC A:   Anemia of critical illness. P:  -F/u CBC  INFECTIOUS A:   Asp likely P:   -ct  Levo x 7ds total ( pcn all) Sputum cx 9/13 >> ng  ENDOCRINE A:   Hyperglycemia   P:   - ssi  TODAY'S [place] therapy continues for vasospasm post SAH, prognosis guarded given worsening neuro exam, hope to clarify plan with son when he comes in tomorrow (d/w sister over the phone)   Care during the described time interval was provided by me and/or other providers on the critical care team.  I have reviewed this patient's available data, including medical history, events of note, physical examination and test results as part of my evaluation  CC time x 48m  Kara Mead MD. FCCP. [place] [facility] Pager [phone] If no response call [phone]

## 2013-11-30 NOTE — Progress Notes (Signed)
VASCULAR LAB PRELIMINARY  PRELIMINARY  PRELIMINARY  PRELIMINARY  Transcranial Doppler  Date POD PCO2 HCT BP  MCA ACA PCA OPHT SIPH VERT Basilar  12/04/2013 SB     Right  Left   40  26   --  -30   --  --   21  18   30  31    --  --     --    11-21-13 hc     Right  Left   -  -   -  -   -  -   30  23   -39  56   -44  -47   -45      11-23-13 fgn     Right  Left   -  -   -  -   -  -   22  24   35  24   31  27           11-26-13 hc     Right  Left   -  -   -  -   -  -   29  21   35  64   ####  -38     -50    11-28-13 SB      Right  Left   14  36   --  -39   --  --   19  29   34  62   --  -25     -71    11-30-13 SB     Right  Left   35  --   --  --   --  --   23  29   32  38   --  --     -73         Right  Left                                        MCA = Middle Cerebral Artery      OPHT = Opthalmic Artery     BASILAR = Basilar Artery   ACA = Anterior Cerebral Artery     SIPH = Carotid Siphon PCA = Posterior Cerebral Artery   VERT = Verterbral Artery                   Normal MCA = 62+\-12 ACA = 50+\-12 PCA = 42+\-23   ####continuous EEG dressings and patient position--unable to obtain right vertebral  Ia Leeb, RVT 11/30/2013, 11:15 AM

## 2013-11-30 NOTE — Progress Notes (Signed)
Subjective: Patient reports (vent support)  Objective: Vital signs in last 24 hours: Temp:  [98.7 F (37.1 C)-100 F (37.8 C)] 99.2 F (37.3 C) (09/18 0430) Pulse Rate:  [62-167] 82 (09/18 0721) Resp:  [23-32] 30 (09/18 0721) BP: (123-189)/(48-97) 168/68 mmHg (09/18 0600) SpO2:  [100 %] 100 % (09/18 0721) Arterial Line BP: (185)/(75) 185/75 mmHg (09/17 0900) FiO2 (%):  [30 %] 30 % (09/18 0721) Weight:  [69.1 kg (152 lb 5.4 oz)] 69.1 kg (152 lb 5.4 oz) (09/18 0440)  Intake/Output from previous day: 09/17 0701 - 09/18 0700 In: 5726.3 [I.V.:3538.8; NG/GT:1320; IV Piggyback:867.5] Out: 3150 [Urine:2900; Stool:250] Intake/Output this shift:    Unresponsive, without change. PEARL. Withdraws to painful stim right.   Lab Results:  Recent Labs  11/29/13 0630 11/30/13 0615  WBC 15.3* 17.6*  HGB 10.5* 9.9*  HCT 31.8* 29.4*  PLT 253 233   BMET  Recent Labs  11/29/13 0630 11/30/13 0615  NA 142 140  K 3.6* 2.9*  CL 108 105  CO2 18* 20  GLUCOSE 151* 144*  BUN 28* 27*  CREATININE 0.77 0.66  CALCIUM 9.2 8.6    Studies/Results: Dg Chest Port 1 View  11/30/2013   CLINICAL DATA:  New benign is, followup  EXAM: PORTABLE CHEST - 1 VIEW  COMPARISON:  Portable chest x-ray of 11/29/2013  FINDINGS: Only mild basilar atelectasis is present with improved aeration. The endotracheal tube tip is approximately 3.0 cm above the carina. Left IJ central venous line tip overlies the mid proximal SVC. Heart size is stable.  IMPRESSION: Slightly better aeration with some decrease in basilar atelectasis.   Electronically Signed   By: Ivar Drape M.D.   On: 11/30/2013 08:05   Dg Chest Port 1 View  11/29/2013   CLINICAL DATA:  Pneumonitis  EXAM: PORTABLE CHEST - 1 VIEW  COMPARISON:  11/28/2013  FINDINGS: Cardiomediastinal silhouette is stable. Stable support apparatus. Probable trace left pleural effusion. Small bilateral basilar atelectasis or infiltrate. No pulmonary edema.  IMPRESSION: Stable  support apparatus. Again noted bilateral basilar atelectasis or infiltrate. Probable trace left pleural effusion.   Electronically Signed   By: Lahoma Crocker M.D.   On: 11/29/2013 08:09    Assessment/Plan:   LOS: 12 days  Continue support. Pt's son reportedly works during week, planning to be available to discuss plan on Saturday?    Verdis Prime 11/30/2013, 8:25 AM

## 2013-11-30 NOTE — Progress Notes (Signed)
STROKE TEAM PROGRESS NOTE   HISTORY  63 y.o. 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 She has surrounding cytotoxic edema and midline shift and is employed left MCA aneurysm as well.    SUBJECTIVE (INTERVAL HISTORY)  No seizure activity overnight. 3% stopped by NSG, Na 140 today. Still intubated for airway protection. Neuro no change.  OBJECTIVE  Filed Vitals:   11/30/13 1500 11/30/13 1509 11/30/13 1600 11/30/13 1700  BP: 147/53  167/70   Pulse: 73 85 74 77  Temp:      TempSrc:      Resp: 26 16 27 29   Height:      Weight:      SpO2: 100% 100% 100% 100%    Recent Labs  Lab  11/28/2013 1900  11/26/2013 0245  11/20/13 0550  11/21/13 0500   NA  132*  128*  131*  131*   K  3.6*  3.7  3.2*  2.7*   CL  93*  92*  96  95*   CO2  23  20  22  22    GLUCOSE  142*  148*  109*  124*   BUN  10  11  10  9    CREATININE  0.99  0.98  0.82  0.72   CALCIUM  8.5  8.0*  7.2*  7.0*   MG  1.0*  --  --  --     Recent Labs  Lab  11/24/2013 1900  11/20/13 0550   AST  74*  49*   ALT  37*  24   ALKPHOS  122*  86   BILITOT  1.2  0.9   PROT  6.8  5.8*   ALBUMIN  3.3*  2.6*     Recent Labs  Lab  12/07/2013 2345  11/23/2013 0245  11/20/13 0550  11/21/13 0500   WBC  --  7.1  8.0  7.8   NEUTROABS  --  --  6.2  --   HGB  --  11.1*  9.6*  9.9*   HCT  --  32.0*  28.0*  28.6*   MCV  --  100.3*  99.6  99.3   PLT  205  186  248  215     Recent Labs   11/20/2013 1900   LABPROT  12.5   INR  0.93      Component  Value  Date/Time    CHOL  118  05/18/2012 0512    TRIG  90  11/25/2013 1900    HDL  66  05/18/2012 0512    CHOLHDL  1.8  05/18/2012 0512    VLDL  14  05/18/2012 0512    LDLCALC  38  05/18/2012 0512    Ct Angio Head W/cm &/or Wo Cm  11/24/2013 1. Unchanged appearance of extensive, predominantly right-sided  subarachnoid hemorrhage as well as 2.7 cm hematoma in the region of the right insula. Moderate surrounding edema with 5 mm of unchanged midline shift. 2. No right MCA aneurysm is identified, however there is severe attenuation of the right MCA and its branch vessels which may reflect local mass effect and vasospasm secondary to surrounding hemorrhage, and this could obscure an underlying right MCA aneurysm. Narrowing of the ACAs and right ICA terminus also may reflect vasospasm. Catheter angiography may be helpful for further evaluation.  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  Dg Chest 1 View  11/21/2013 Improved aeration of the left lung base with mild basilar atelectasis remaining  11/20/2013 Slight increase in left basilar opacity most consistent with atelectasis.  12/07/2013 No significant change in mild opacity in the lateral left lung base.  12/06/2013 Endotracheal tube and nasogastric tube in appropriate position. Stable mild opacity in lateral left lung base.  Ct Head Wo Contrast  11/25/2013 Hemorrhagic right MCA infarct is stable. 6 mm midline shift unchanged. No new findings. 11/23/2013 1. Unchanged intracranial hemorrhage as above. 2. Unchanged edema in the right temporal, frontal, and parietal lobes, consistent with right MCA infarct. Similar degree of leftward midline shift. 11/21/2013 1. No significant interval change in intraparenchymal, subarachnoid, and subdural hemorrhage as compared to 11/20/13. Associated vasogenic edema is similar with stable 4-5 mm of right-to-left midline shift. No hydrocephalus. 2. No new hemorrhage.  11/20/2013 1. No change in the intracranial hemorrhage since the most recent prior study. Right frontal parenchymal hematoma is stable in size as is the degree of mass effect. Subarachnoid hemorrhage and areas of subdural hemorrhage are stable. No new intracranial hemorrhage. 2. Mass effect and midline shift ir stable. 3. No hydrocephalus.   11/21/2013 Similar extensive right greater than left subarachnoid blood, with a trace possible posterior falcine subdural hematoma. Evolving 2.7 x 2.4 cm right insular hematoma with similar 5 mm right to left midline shift. No hydrocephalus.  TCD  12/05/2013 Limited study due to poor windows and neck strap limiting evaluation. Low normal mean flow velocities in identified vessels of anterior circulation. Globally elevated pulsatility indices suggest increase intracranial presure or diffuse intracranial atherosclerosis.  EEG LTM 11/27/13 Clinical interpretation: This 24 hours of intensive EEG monitoring with simultaneous video monitoring did not record any clinical or subclinical seizures. Background activities were marked by background activities slowing non- reactive to external stimuli, suggestive of a severe encephalopathy of nonspecific etiologies including toxic metabolic pharmacologic multifocal degenerative etiologies. Right hemisphere background slowing was more prominent and attenuated , lacking runs of faster frequencies. These Findings are suggestive of an additional neuronal dysfunction across the right hemisphere. There is no epileptiform discharges clinical or subclinical seizures present.  Clinical correlation is advised.  PHYSICAL EXAM  Filed Vitals:   11/30/13 1500 11/30/13 1509 11/30/13 1600 11/30/13 1700  BP: 147/53  167/70   Pulse: 73 85 74 77  Temp:      TempSrc:      Resp: 26 16 27 29   Height:      Weight:      SpO2: 100% 100% 100% 100%    [name] intubated. Afebrile. Head is nontraumatic. Neck is supple without bruit. Cardiac exam no murmur or gallop. Lungs are coarse rhonchi bilaterally. Distal pulses are well felt.   Neurologic Examination:  Intubated and not on sedation. Unresponsive to voice, not following commands, but still responds to sternal rub and pain stimulation by localizing with right arm, can lift to barely against gravity with RLE on pain  stimulation. LUE 0/5 and LLE 2/5 on pain stimulation. Pupils are equal and reactive. Fundi were not visualized. Right gaze preference. Right plantar is downgoing left is equivocal. Coordination and gait cannot be tested.  ASSESSMENT/PLAN  Ms. HAWRAA STAMBAUGH is a 63 y.o. female with history of CHF, depression, elevated cholesterol, COPD, MI, and CAD on aspirin and plavix s/p fall where she hit her head. Presenting to [facility] 11/17/2013 with left sided  weakness, facial droop and headache. Intubated there and transferred to [facility] Imaging confirms a right SAH, R IPH, with right MCA territory edema and hypodensity, concerning for right MCA stroke due to vasospasm. Developed partial seizure episodes, increased depakote and load with vimpat. Hyponatremia with likely CNS salt wasting, on 3% saline. Intubated due to airway protection. Na up to 126 today from 119 yesterday.  Hemorrhage: R insular IPH with R perisylvian SAH & R SDH likely from R MCA aneurysm not seen on angio, now with new neurological deficits likely related to vasospasm with vasogenic edema and mass effect  aspirin 81 mg orally every day and clopidogrel 75 mg orally every day prior to admission, held now secondary to IPH Vasospasm confirmed on angiogram and treated with verapamil IA.  Concerning for right MCA stroke due to vasospasm Repeat CT stable lovenox for VTE prophylaxis  NPO  Bedrest  Resultant Left hemiplegia  Respiratory failure - intubated on no sedation - vent per CCM  Partial seizure - left UE shaking episodes, still intermittent overnight - depakote increased to 750mg  tid - increase vimpat maintenance dose to 100mg  bid - depakote level 86.2 today, stable over days, d/c monitoring  - d/c LTM EEG  Hyponatremia - Na 119 -> 126 -> 133 -> 140 -> 142 ->140 - Urine Na 80 - likely salt wasting - 3% saline stopped by NSG.  - BMP q4h  Cerebral vasospasm - on nimodipine - s/p IA verapamil - triple H therapy, on  neo - NSG on board  Hypertension  - part of the triple-H therapy now - on neo for desired hypertension - NSG on board - hemorrhage so far stable - goal 160-180  Hyperlipidemia  Home meds: pravachol. 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 Coronary artery disease - MI, angioplasty 2013 Ischemic cardiomyopathy  Other Pertinent History  AAA, infrarenal  R hepatic lobe lesion   Rosalin Hawking, MD PhD Stroke Neurology 11/30/2013 5:36 PM   To contact Stroke Continuity provider, please refer to [email or web address]  After hours, contact General Neurology

## 2013-11-30 NOTE — Progress Notes (Signed)
Chaplain responded to nurse page for family request for prayer. Chaplain offered prayer and emotional support. Chaplain will follow as needed. Page chaplain should pt and family need more support.   11/30/13 1300  Clinical Encounter Type  Visited With Patient and family together  Visit Type Initial;Spiritual support  Referral From Nurse  Spiritual Encounters  Spiritual Needs Prayer;Emotional  Stress Factors  Family Stress Factors Loss;Family relationships  Trenee Igoe, Barbette Hair, Chaplain 11/30/2013 1:22 PM

## 2013-12-01 ENCOUNTER — Inpatient Hospital Stay (HOSPITAL_COMMUNITY): Payer: Medicare Other

## 2013-12-01 LAB — GLUCOSE, CAPILLARY
GLUCOSE-CAPILLARY: 173 mg/dL — AB (ref 70–99)
GLUCOSE-CAPILLARY: 147 mg/dL — AB (ref 70–99)
GLUCOSE-CAPILLARY: 124 mg/dL — AB (ref 70–99)
Glucose-Capillary: 155 mg/dL — ABNORMAL HIGH (ref 70–99)
Glucose-Capillary: 124 mg/dL — ABNORMAL HIGH (ref 70–99)

## 2013-12-01 LAB — BASIC METABOLIC PANEL
ANION GAP: 14 (ref 5–15)
Anion gap: 14 (ref 5–15)
BUN: 23 mg/dL (ref 6–23)
BUN: 22 mg/dL (ref 6–23)
CALCIUM: 8.8 mg/dL (ref 8.4–10.5)
CHLORIDE: 105 meq/L (ref 96–112)
CO2: 20 mEq/L (ref 19–32)
CO2: 19 mEq/L (ref 19–32)
CREATININE: 0.65 mg/dL (ref 0.50–1.10)
Calcium: 8.4 mg/dL (ref 8.4–10.5)
Chloride: 101 mEq/L (ref 96–112)
Creatinine, Ser: 0.61 mg/dL (ref 0.50–1.10)
GFR calc Af Amer: >90 mL/min (ref >90)
GFR calc non Af Amer: >90 mL/min (ref >90)
GLUCOSE: 126 mg/dL — AB (ref 70–99)
Glucose, Bld: 155 mg/dL — ABNORMAL HIGH (ref 70–99)
POTASSIUM: 2.6 meq/L — AB (ref 3.7–5.3)
POTASSIUM: 4.1 meq/L (ref 3.7–5.3)
SODIUM: 134 meq/L — AB (ref 137–147)
Sodium: 139 mEq/L (ref 137–147)

## 2013-12-01 LAB — CBC
HEMATOCRIT: 29 % — AB (ref 36.0–46.0)
HEMOGLOBIN: 9.7 g/dL — AB (ref 12.0–15.0)
MCH: 33.9 pg (ref 26.0–34.0)
MCHC: 33.4 g/dL (ref 30.0–36.0)
MCV: 101.4 fL — ABNORMAL HIGH (ref 78.0–100.0)
Platelets: 240 10*3/uL (ref 150–400)
RBC: 2.86 MIL/uL — AB (ref 3.87–5.11)
RDW: 13.6 % (ref 11.5–15.5)
WBC: 20.7 10*3/uL — ABNORMAL HIGH (ref 4.0–10.5)

## 2013-12-01 LAB — MAGNESIUM: Magnesium: 1.6 mg/dL (ref 1.5–2.5)

## 2013-12-01 LAB — PHOSPHORUS: Phosphorus: 3.1 mg/dL (ref 2.3–4.6)

## 2013-12-01 LAB — CLOSTRIDIUM DIFFICILE BY PCR: Toxigenic C. Difficile by PCR: NEGATIVE

## 2013-12-01 MED ORDER — VANCOMYCIN HCL IN DEXTROSE 750-5 MG/150ML-% IV SOLN
750.0000 mg | Freq: Two times a day (BID) | INTRAVENOUS | Status: DC
  Administered 2013-12-01 – 2013-12-06 (×12): 750 mg via INTRAVENOUS
  Filled 2013-12-01 (×14): qty 150

## 2013-12-01 MED ORDER — POTASSIUM CHLORIDE 10 MEQ/100ML IV SOLN
10.0000 meq | INTRAVENOUS | Status: AC
  Administered 2013-12-01 (×4): 10 meq via INTRAVENOUS
  Filled 2013-12-01 (×4): qty 100

## 2013-12-01 MED ORDER — POTASSIUM CHLORIDE 20 MEQ/15ML (10%) PO LIQD
40.0000 meq | Freq: Four times a day (QID) | ORAL | Status: AC
  Administered 2013-12-01 (×2): 40 meq
  Filled 2013-12-01 (×2): qty 30

## 2013-12-01 NOTE — Progress Notes (Addendum)
ANTIBIOTIC CONSULT NOTE - INITIAL  Pharmacy Consult for Vancomycin  Indication: rule out sepsis  Allergies  Allergen Reactions  . Penicillins Swelling and Other (See Comments)    Per patient report, she has facial and tongue swellling  . Percocet [Oxycodone-Acetaminophen] Other (See Comments)    Hallucinations  . Percodan [Oxycodone-Aspirin] Other (See Comments)    Hallucinations    Patient Measurements: Height: 5\' 4"  (162.6 cm) Weight: 154 lb 5.2 oz (70 kg) IBW/kg (Calculated) : 54.7  Vital Signs: Temp: 101 F (38.3 C) (09/19 0327) Temp src: Oral (09/19 0327) BP: 173/69 mmHg (09/19 0600) Pulse Rate: 72 (09/19 0600)  Labs:  Recent Labs  11/28/13 1800 11/29/13 0630 11/30/13 0615 12/01/13 0700  WBC  --  15.3* 17.6* 20.7*  HGB  --  10.5* 9.9* 9.7*  PLT  --  253 233 240  CREATININE 0.73 0.77 0.66  --    Estimated Creatinine Clearance: 67.3 ml/min (by C-G formula based on Cr of 0.66).   Medical History: Past Medical History  Diagnosis Date  . Hypertension     a. Hypotension noted 05/2012.  Marland Kitchen Anxiety   . Depression   . Arthritis   . Irritable bowel syndrome (IBS)   . Diverticulitis   . Insomnia   . Eczema   . Gout   . GI bleed     Possible mild GI bleed 04/2011, hemoccult negative at [facility] and no colonoscopy/EGD felt necessary by GI at that time  . Ischemic cardiomyopathy     IMPROVED. a. EF 40-45% by echo at [facility] 04/2011. b. Improved to 55% by echo 05/2012.  Marland Kitchen CAD (coronary artery disease)     a. s/p two overlapping BMS to LAD 05/12/11, Occluded mid LCX with collaterals. b. NSTEMI 05/2012: PTCA/DES to mid Cx and PTCA/DES to prox-mid LAD 05/18/12.  Marland Kitchen Hyperlipidemia   . AAA (abdominal aortic aneurysm) 05/19/2012    a. Infrarenal AAA by CT at [facility] 3.4cm 05/2012.  . Tobacco abuse   . Hepatic lesion     a. Right hepatic lobe density of uncertain significance by CT at [facility] 05/2012 - needs f/u PCP.  Marland Kitchen Acute renal insufficiency     05/2012 - Cr 1.7.  Improved.  . Tobacco use disorder 05/25/2012  . GERD (gastroesophageal reflux disease) 01/18/2013  . Seizures 11/25/2013    Assessment: Broadening antibiotic coverage given increasing WBC, continued fever. Vancomycin per Rx/Levaquin per MD.   Goal of Therapy:  Vancomycin trough level 15-20 mcg/ml  Plan:  -Vancomycin 750 mg IV q12h -Trend WBC, temp, renal function  -Drug levels as indicated    Carla Hoover 12/01/2013,7:42 AM

## 2013-12-01 NOTE — Progress Notes (Signed)
CRITICAL VALUE ALERT  Critical value received:  2.6 K   Date of notification:  12/01/2013   Time of notification:  9678  Critical value read back:Yes.    Nurse who received alert:  [employer] trader  MD notified (1st page):  Dr. Elsworth Soho   Time of first page:  0800  MD notified (2nd page):  Time of second page:  Responding MD:  Dr. Elsworth Soho   Time MD responded:  0800

## 2013-12-01 NOTE — Progress Notes (Addendum)
PULMONARY / CRITICAL CARE MEDICINE   Name: Carla Hoover MRN: [id number] DOB: 1950/09/30    ADMISSION DATE:  12/06/2013 CONSULTATION DATE:  11/18/2013  REFERRING MD :  Erline Levine  CHIEF COMPLAINT:  Weakness  INITIAL PRESENTATION:  63 yo female fell and hit her head one day prior to admit.  Presented to [facility] with Lt sided weakness and facial drop.  CT head showed ICH with SAH.  She required intubation prior to transfer to [facility].  PCCM consulted for vent/medical management.  STUDIES:  9/06 CT head ([place]) 9/07 Cerebral arteriogram >> 7 mm Lt MCA Aneurysm, vasospasm of Rt MCA 9/09 CT head >> no change in ICH, SAH, SDH 9/11 CT head >> unchanged ICH or MCA infarct changes 9/13 head CT- 6 mm shift, no change in ICH  SIGNIFICANT EVENTS: 9/06 Transfer from [place] to [facility] 9/11 More lethargic, Lt side weaker >> start [facility] therapy  9/13 ETT placed, hyponatemia  SUBJECTIVE:  febrile Remains comatose off sedative gtt CVP 6 Excellent UO   VITAL SIGNS: Temp:  [98.9 F (37.2 C)-101 F (38.3 C)] 101 F (38.3 C) (09/19 0327) Pulse Rate:  [72-119] 72 (09/19 0600) Resp:  [16-37] 23 (09/19 0600) BP: (101-177)/(50-92) 173/69 mmHg (09/19 0600) SpO2:  [100 %] 100 % (09/19 0600) FiO2 (%):  [30 %] 30 % (09/19 0420) Weight:  [70 kg (154 lb 5.2 oz)] 70 kg (154 lb 5.2 oz) (09/19 0500) INTAKE / OUTPUT:  Intake/Output Summary (Last 24 hours) at 12/01/13 0734 Last data filed at 12/01/13 0617  Gross per 24 hour  Intake 5636.15 ml  Output   4150 ml  Net 1486.15 ml    PHYSICAL EXAMINATION:  Gen: acutely ill, intubated, RASS-5 HEENT NG tube PULM:clear BL CV: RRR, no mgr AB: BS+, soft, nontender Ext: warm no edema Neuro:moves RUE/LE to  pain, left hemiparesis,pupils dilated reactive BL, lt plantar upgoing,does not follow commands  LABS:  CBC  Recent Labs Lab 11/29/13 0630 11/30/13 0615 12/01/13 0700  WBC 15.3* 17.6* 20.7*  HGB 10.5* 9.9* 9.7*  HCT 31.8* 29.4*  29.0*  PLT 253 233 240   Coag's No results found for this basename: APTT, INR,  in the last 168 hours BMET  Recent Labs Lab 11/28/13 1800 11/29/13 0630 11/30/13 0615  NA 141 142 140  K 3.3* 3.6* 2.9*  CL 107 108 105  CO2 18* 18* 20  BUN 26* 28* 27*  CREATININE 0.73 0.77 0.66  GLUCOSE 139* 151* 144*   Electrolytes  Recent Labs Lab 11/27/13 0600  11/28/13 1800 11/29/13 0630 11/30/13 0615  CALCIUM 8.4  < > 9.1 9.2 8.6  MG 1.6  --   --  1.9 1.3*  PHOS 2.7  --   --  3.4 3.5  < > = values in this interval not displayed.  ABG  Recent Labs Lab 11/25/13 1540  PHART 7.458*  PCO2ART 26.6*  PO2ART 130.0*   Liver Enzymes  Recent Labs Lab 11/26/13 0435  AST 86*  ALT 48*  ALKPHOS 88  BILITOT 0.3  ALBUMIN 2.2*   Imaging Dg Chest Port 1 View  11/30/2013   CLINICAL DATA:  New benign is, followup  EXAM: PORTABLE CHEST - 1 VIEW  COMPARISON:  Portable chest x-ray of 11/29/2013  FINDINGS: Only mild basilar atelectasis is present with improved aeration. The endotracheal tube tip is approximately 3.0 cm above the carina. Left IJ central venous line tip overlies the mid proximal SVC. Heart size is stable.  IMPRESSION: Slightly better  aeration with some decrease in basilar atelectasis.   Electronically Signed   By: Ivar Drape M.D.   On: 11/30/2013 08:05     ASSESSMENT / PLAN: NEUROLOGIC A:   ICH with SAH with concern for vasospasm/MCA stroke 9/11 Hx of anxiety, depression, insomnia  Hx of ETOH abuse Seizure likely, cEEG did not show P:   -Valproate for seizure prevention per neurology --nimodipine x 21 days -Continue simvastatin -Continue HHH therapy >> goal SBP 160 to 180 with neo gtt - cvp to goal 10,ct NS @ 75/h -Intermittent TCD's -Continue prozac -Thiamine, folic acid -Unfortunately mental status has not improved   PULMONARY OETT 9/06 >> 9/09, 9/11 >> A: Acute respiratory failure in setting of ICH Bibasilar Atelectasis with mild hypoxemia Hx of COPD,  tobacco abuse P:   - Oxygen to keep SpO2 > 92% - PRN albuterol -SBTs but no extubation   CARDIOVASCULAR aline 9/11 >> CVL 9/11 >> A:  Hx of CAD, hyperlipidemia, HTN, chronic systolic heart failure, AAA. P:  -Hold ASA, plavix in setting of ICH until okay with neurology/neurosurgery -Hold lopressor, imdur, lasix while getting HHH therapy -neo as needed for goal SBP 160-180 vs MAP 80-105 -Continue simvastatin (uses pravachol as outpt)   RENAL A:   Hyponatremia Decline in Na with associated seizures  R/o siadh (as limited progress and na drop on nacl), doubt CSW Hypokaleia/hypomag P:   ct florinef NS @ 7/h to goal CVp 10 bmet daily Replete lytes   GASTROINTESTINAL A:   Protein calorie malnutrition Dysphagia Hx of IBS Diarrhea, c diff neg  P:   -Protonix for SUP -Panda tube for meds, TFs -imodium ok  HEMATOLOGIC A:   Anemia of critical illness. P:  -F/u CBC  INFECTIOUS A:   Asp likely, fever & rising WC P:   -ct  Levo x 7ds total ( pcn all) Sputum cx 9/13 >> ng Dc levaquin, add vanc - if WC rises further , will dc CVL  ENDOCRINE A:   Hyperglycemia   P:   - ssi  TODAY'S [place] therapy continues for vasospasm post SAH, prognosis for meaningful recovery guarded given poor neuro exam but too many confounders -fever/?infection / seizure for accurate prognostication Discussed with son in detail - will plan for trach/ PEG next week   Care during the described time interval was provided by me and/or other providers on the critical care team.  I have reviewed this patient's available data, including medical history, events of note, physical examination and test results as part of my evaluation  CC time x 17m  Kara Mead MD. FCCP. [facility] Pager [phone] If no response call [phone]

## 2013-12-01 NOTE — Progress Notes (Signed)
eLink Physician-Brief Progress Note Patient Name: Carla Hoover DOB: 29-Jul-1950 MRN: [id number]   Date of Service  12/01/2013  HPI/Events of Note   Diarrhea on Reglan   eICU Interventions   Check C. Dif and DC reglan      Intervention Category Intermediate Interventions: OtherLowry Ram, Haven Foss R. 12/01/2013, 4:30 PM

## 2013-12-01 NOTE — Progress Notes (Signed)
Pt seen and examined. No issues overnight.   EXAM: Temp:  [98.4 F (36.9 C)-101 F (38.3 C)] 99.5 F (37.5 C) (09/19 1200) Pulse Rate:  [72-109] 84 (09/19 1600) Resp:  [19-37] 35 (09/19 1600) BP: (101-181)/(50-92) 160/78 mmHg (09/19 1600) SpO2:  [100 %] 100 % (09/19 1600) FiO2 (%):  [22 %-30 %] 30 % (09/19 1600) Weight:  [70 kg (154 lb 5.2 oz)] 70 kg (154 lb 5.2 oz) (09/19 0500) Intake/Output     09/18 0701 - 09/19 0700 09/19 0701 - 09/20 0700   I.V. (mL/kg) 4253.2 (60.8) 1015.5 (14.5)   NG/GT 1140 510   IV Piggyback 460 792.5   Total Intake(mL/kg) 5853.2 (83.6) 2318 (33.1)   Urine (mL/kg/hr) 4375 (2.6) 1225 (1.7)   Stool 125 (0.1)    Total Output 4500 1225   Net +1353.2 +1093         Intubated No eye opening to pain Minimal RUE movement to pain No movement of RLE or Left side (+) cough/gag  LABS: Lab Results  Component Value Date   CREATININE 0.65 12/01/2013   BUN 23 12/01/2013   NA 139 12/01/2013   K 2.6* 12/01/2013   CL 105 12/01/2013   CO2 20 12/01/2013   Lab Results  Component Value Date   WBC 20.7* 12/01/2013   HGB 9.7* 12/01/2013   HCT 29.0* 12/01/2013   MCV 101.4* 12/01/2013   PLT 240 12/01/2013    IMPRESSION: - 63 y.o. female SAH d#14 with RMCA stroke and poor neurologic exam for last several days without sign of improvement. Likely very poor neurologic prognosis.   PLAN: - Will cont to w/u leukocytosis for possible source of infection - Cont current mgmt  I spoke with the patient's family including her son this am regarding her lack of improvement over the last several days and the likely very poor prognosis. I reviewed options for further management should she continue to be unchanged which would include trach/PEG and LTACH, vs a more palliative approach. All their questions were answered.

## 2013-12-01 NOTE — Progress Notes (Signed)
STROKE TEAM PROGRESS NOTE   HISTORY  63 y.o. 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 She has surrounding cytotoxic edema and midline shift and is employed left MCA aneurysm as well.    SUBJECTIVE (INTERVAL HISTORY)  No seizure activity overnight. 3% stopped by NSG, Na 140 today. Still intubated for airway protection. Neuro no change.  OBJECTIVE  Filed Vitals:   12/01/13 1830 12/01/13 1845 12/01/13 1900 12/01/13 1936  BP: 154/79 156/67 160/67 171/75  Pulse: 96 84 83 77  Temp:      TempSrc:      Resp: 31 23 24 25   Height:      Weight:      SpO2: 100% 100% 100% 100%    Recent Labs  Lab  11/25/2013 1900  11/27/2013 0245  11/20/13 0550  11/21/13 0500   NA  132*  128*  131*  131*   K  3.6*  3.7  3.2*  2.7*   CL  93*  92*  96  95*   CO2  23  20  22  22    GLUCOSE  142*  148*  109*  124*   BUN  10  11  10  9    CREATININE  0.99  0.98  0.82  0.72   CALCIUM  8.5  8.0*  7.2*  7.0*   MG  1.0*  --  --  --     Recent Labs  Lab  12/12/2013 1900  11/20/13 0550   AST  74*  49*   ALT  37*  24   ALKPHOS  122*  86   BILITOT  1.2  0.9   PROT  6.8  5.8*   ALBUMIN  3.3*  2.6*     Recent Labs  Lab  11/14/2013 2345  11/16/2013 0245  11/20/13 0550  11/21/13 0500   WBC  --  7.1  8.0  7.8   NEUTROABS  --  --  6.2  --   HGB  --  11.1*  9.6*  9.9*   HCT  --  32.0*  28.0*  28.6*   MCV  --  100.3*  99.6  99.3   PLT  205  186  248  215     Recent Labs   12/01/2013 1900   LABPROT  12.5   INR  0.93      Component  Value  Date/Time    CHOL  118  05/18/2012 0512    TRIG  90  12/11/2013 1900    HDL  66  05/18/2012 0512    CHOLHDL  1.8  05/18/2012 0512    VLDL  14  05/18/2012 0512    LDLCALC  38  05/18/2012 0512    Ct Angio Head W/cm &/or Wo Cm  12/12/2013 1. Unchanged appearance of extensive, predominantly  right-sided subarachnoid hemorrhage as well as 2.7 cm hematoma in the region of the right insula. Moderate surrounding edema with 5 mm of unchanged midline shift. 2. No right MCA aneurysm is identified, however there is severe attenuation of the right MCA and its branch vessels which may reflect local mass effect and vasospasm secondary to surrounding hemorrhage, and this could obscure an underlying right MCA aneurysm. Narrowing of the ACAs and right ICA terminus also may reflect vasospasm. Catheter angiography may be helpful for further evaluation.  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  Dg Chest 1 View  11/21/2013 Improved aeration of the left lung base with mild basilar atelectasis remaining  11/20/2013 Slight increase in left basilar opacity most consistent with atelectasis.  11/21/2013 No significant change in mild opacity in the lateral left lung base.  11/20/2013 Endotracheal tube and nasogastric tube in appropriate position. Stable mild opacity in lateral left lung base.  Ct Head Wo Contrast  11/25/2013 Hemorrhagic right MCA infarct is stable. 6 mm midline shift unchanged. No new findings. 11/23/2013 1. Unchanged intracranial hemorrhage as above. 2. Unchanged edema in the right temporal, frontal, and parietal lobes, consistent with right MCA infarct. Similar degree of leftward midline shift. 11/21/2013 1. No significant interval change in intraparenchymal, subarachnoid, and subdural hemorrhage as compared to 11/20/13. Associated vasogenic edema is similar with stable 4-5 mm of right-to-left midline shift. No hydrocephalus. 2. No new hemorrhage.  11/20/2013 1. No change in the intracranial hemorrhage since the most recent prior study. Right frontal parenchymal hematoma is stable in size as is the degree of mass effect. Subarachnoid hemorrhage and areas of subdural hemorrhage are stable. No new intracranial hemorrhage. 2. Mass effect and midline shift ir stable. 3. No  hydrocephalus.  12/07/2013 Similar extensive right greater than left subarachnoid blood, with a trace possible posterior falcine subdural hematoma. Evolving 2.7 x 2.4 cm right insular hematoma with similar 5 mm right to left midline shift. No hydrocephalus.  TCD  12/07/2013 Limited study due to poor windows and neck strap limiting evaluation. Low normal mean flow velocities in identified vessels of anterior circulation. Globally elevated pulsatility indices suggest increase intracranial presure or diffuse intracranial atherosclerosis.  EEG LTM 11/27/13 Clinical interpretation: This 24 hours of intensive EEG monitoring with simultaneous video monitoring did not record any clinical or subclinical seizures. Background activities were marked by background activities slowing non- reactive to external stimuli, suggestive of a severe encephalopathy of nonspecific etiologies including toxic metabolic pharmacologic multifocal degenerative etiologies. Right hemisphere background slowing was more prominent and attenuated , lacking runs of faster frequencies. These Findings are suggestive of an additional neuronal dysfunction across the right hemisphere. There is no epileptiform discharges clinical or subclinical seizures present.  Clinical correlation is advised.  PHYSICAL EXAM  Filed Vitals:   12/01/13 1830 12/01/13 1845 12/01/13 1900 12/01/13 1936  BP: 154/79 156/67 160/67 171/75  Pulse: 96 84 83 77  Temp:      TempSrc:      Resp: 31 23 24 25   Height:      Weight:      SpO2: 100% 100% 100% 100%    [name] intubated. Afebrile. Head is nontraumatic. Neck is supple without bruit. Cardiac exam no murmur or gallop. Lungs are coarse rhonchi bilaterally. Distal pulses are well felt.   Neurologic Examination:  Intubated and not on sedation. Unresponsive to voice, not following commands. No response to sternal rub. Perrl. Eyes conjugate, no gaze deviation, no abnormal ,ovements notes.     ASSESSMENT/PLAN  [name] is a 63 y.o. female with history of CHF, depression, elevated cholesterol, COPD, MI, and CAD on aspirin and plavix s/p fall where she hit her head. Presenting to [facility] 11/25/2013 with left sided weakness, facial droop and headache. Intubated there and transferred to [facility]. Imaging confirms a right SAH, R IPH, with right MCA territory edema and hypodensity, concerning for right MCA stroke due to vasospasm. Developed partial seizure episodes, increased depakote and load  with vimpat. Hyponatremia with likely CNS salt wasting, on 3% saline. Intubated due to airway protection. Na up to 126 today from 119 yesterday.  Hemorrhage: R insular IPH with R perisylvian SAH & R SDH likely from R MCA aneurysm not seen on angio, now with new neurological deficits likely related to vasospasm with vasogenic edema and mass effect  aspirin 81 mg orally every day and clopidogrel 75 mg orally every day prior to admission, held now secondary to IPH Vasospasm confirmed on angiogram and treated with verapamil IA.  Concerning for right MCA stroke due to vasospasm Repeat CT stable lovenox for VTE prophylaxis  NPO  Bedrest  Resultant Left hemiplegia  Respiratory failure - intubated on no sedation - vent per CCM  Partial seizure - left UE shaking episodes, none noted overnight - depakote increased to 750mg  tid - increase vimpat maintenance dose to 100mg  bid - depakote level 86.2 today, stable over days, d/c monitoring  - d/c LTM EEG  Hyponatremia - likely salt wasting - 3% saline stopped by NSG.  - BMP q4h  Cerebral vasospasm - on nimodipine - s/p IA verapamil - triple H therapy, on neo - NSG on board  Hypertension  - part of the triple-H therapy now - on neo for desired hypertension - NSG on board - hemorrhage so far stable - goal 160-180  Hyperlipidemia  Home meds: pravachol. 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 Coronary artery disease - MI, angioplasty 2013 Ischemic cardiomyopathy  Other Pertinent History  AAA, infrarenal  R hepatic lobe lesion   Georgia Dom, MD Stroke Neurology 12/01/2013 8:06 PM   To contact Stroke Continuity provider, please refer to [email or web address]  After hours, contact General Neurology

## 2013-12-02 ENCOUNTER — Inpatient Hospital Stay (HOSPITAL_COMMUNITY): Payer: Medicare Other

## 2013-12-02 LAB — BASIC METABOLIC PANEL
ANION GAP: 15 (ref 5–15)
BUN: 23 mg/dL (ref 6–23)
CO2: 20 meq/L (ref 19–32)
Calcium: 8.5 mg/dL (ref 8.4–10.5)
Chloride: 100 mEq/L (ref 96–112)
Creatinine, Ser: 0.63 mg/dL (ref 0.50–1.10)
GFR calc non Af Amer: >90 mL/min (ref >90)
GLUCOSE: 153 mg/dL — AB (ref 70–99)
Potassium: 3.2 mEq/L — ABNORMAL LOW (ref 3.7–5.3)
Sodium: 135 mEq/L — ABNORMAL LOW (ref 137–147)

## 2013-12-02 LAB — GLUCOSE, CAPILLARY
GLUCOSE-CAPILLARY: 148 mg/dL — AB (ref 70–99)
GLUCOSE-CAPILLARY: 151 mg/dL — AB (ref 70–99)
GLUCOSE-CAPILLARY: 127 mg/dL — AB (ref 70–99)
GLUCOSE-CAPILLARY: 143 mg/dL — AB (ref 70–99)
GLUCOSE-CAPILLARY: 132 mg/dL — AB (ref 70–99)
Glucose-Capillary: 139 mg/dL — ABNORMAL HIGH (ref 70–99)

## 2013-12-02 LAB — CBC
HCT: 28 % — ABNORMAL LOW (ref 36.0–46.0)
HEMOGLOBIN: 9.4 g/dL — AB (ref 12.0–15.0)
MCH: 33.9 pg (ref 26.0–34.0)
MCHC: 33.6 g/dL (ref 30.0–36.0)
MCV: 101.1 fL — ABNORMAL HIGH (ref 78.0–100.0)
Platelets: 287 10*3/uL (ref 150–400)
RBC: 2.77 MIL/uL — ABNORMAL LOW (ref 3.87–5.11)
RDW: 13.5 % (ref 11.5–15.5)
WBC: 23.9 10*3/uL — ABNORMAL HIGH (ref 4.0–10.5)

## 2013-12-02 LAB — PHOSPHORUS: Phosphorus: 2.9 mg/dL (ref 2.3–4.6)

## 2013-12-02 LAB — MAGNESIUM: Magnesium: 1.3 mg/dL — ABNORMAL LOW (ref 1.5–2.5)

## 2013-12-02 MED ORDER — MAGNESIUM SULFATE 4000MG/100ML IJ SOLN
4.0000 g | Freq: Once | INTRAMUSCULAR | Status: AC
  Administered 2013-12-02: 4 g via INTRAVENOUS
  Filled 2013-12-02: qty 100

## 2013-12-02 MED ORDER — PANTOPRAZOLE SODIUM 40 MG PO PACK
40.0000 mg | PACK | ORAL | Status: DC
  Administered 2013-12-02 – 2013-12-06 (×5): 40 mg
  Filled 2013-12-02 (×6): qty 20

## 2013-12-02 MED ORDER — DEXTROSE 5 % IV SOLN
1.0000 g | Freq: Three times a day (TID) | INTRAVENOUS | Status: DC
  Administered 2013-12-02 – 2013-12-06 (×14): 1 g via INTRAVENOUS
  Filled 2013-12-02 (×18): qty 1

## 2013-12-02 MED ORDER — PHENYLEPHRINE HCL 10 MG/ML IJ SOLN
30.0000 ug/min | INTRAVENOUS | Status: DC
  Administered 2013-12-02: 90 ug/min via INTRAVENOUS
  Administered 2013-12-03: 50 ug/min via INTRAVENOUS
  Filled 2013-12-02 (×3): qty 4

## 2013-12-02 MED ORDER — POTASSIUM CHLORIDE 20 MEQ/15ML (10%) PO LIQD
40.0000 meq | ORAL | Status: AC
  Administered 2013-12-02 (×2): 40 meq
  Filled 2013-12-02 (×2): qty 30

## 2013-12-02 NOTE — Progress Notes (Signed)
ANTIBIOTIC CONSULT NOTE - INITIAL  Pharmacy Consult for aztreonam Indication: rule out sepsis  Allergies  Allergen Reactions  . Penicillins Swelling and Other (See Comments)    Per patient report, she has facial and tongue swellling  . Percocet [Oxycodone-Acetaminophen] Other (See Comments)    Hallucinations  . Percodan [Oxycodone-Aspirin] Other (See Comments)    Hallucinations    Patient Measurements: Height: 5\' 4"  (162.6 cm) Weight: 159 lb 9.8 oz (72.4 kg) IBW/kg (Calculated) : 54.7   Vital Signs: Temp: 100.3 F (37.9 C) (09/20 1200) Temp src: Oral (09/20 0753) BP: 159/64 mmHg (09/20 1400) Pulse Rate: 80 (09/20 1400) Intake/Output from previous day: 09/19 0701 - 09/20 0700 In: 5742.5 [I.V.:3540; NG/GT:1110; IV Piggyback:1092.5] Out: 3570 [Urine:2950; Stool:225] Intake/Output from this shift: Total I/O In: 1526.5 [I.V.:751.5; NG/GT:490; IV Piggyback:285] Out: 850 [Urine:850]  Labs:  Recent Labs  11/30/13 0615 12/01/13 0700 12/01/13 1802 12/02/13 0542  WBC 17.6* 20.7*  --  23.9*  HGB 9.9* 9.7*  --  9.4*  PLT 233 240  --  287  CREATININE 0.66 0.65 0.61 0.63   Estimated Creatinine Clearance: 68.4 ml/min (by C-G formula based on Cr of 0.63). No results found for this basename: VANCOTROUGH, Corlis Leak, VANCORANDOM, [place], GENTPEAK, GENTRANDOM, TOBRATROUGH, TOBRAPEAK, TOBRARND, AMIKACINPEAK, AMIKACINTROU, AMIKACIN,  in the last 72 hours   Microbiology: Recent Results (from the past 720 hour(s))  MRSA PCR SCREENING     Status: None   Collection Time    11/20/2013  6:37 PM      Result Value Ref Range Status   MRSA by PCR NEGATIVE  NEGATIVE Final   Comment:            The GeneXpert MRSA Assay (FDA     approved for NASAL specimens     only), is one component of a     comprehensive MRSA colonization     surveillance program. It is not     intended to diagnose MRSA     infection nor to guide or     monitor treatment for     MRSA infections.  CLOSTRIDIUM  DIFFICILE BY PCR     Status: None   Collection Time    11/22/13  6:27 PM      Result Value Ref Range Status   C difficile by pcr NEGATIVE  NEGATIVE Final  CULTURE, RESPIRATORY (NON-EXPECTORATED)     Status: None   Collection Time    11/25/13  3:21 PM      Result Value Ref Range Status   Specimen Description ENDOTRACHEAL   Final   Special Requests NONE   Final   Gram Stain     Final   Value: FEW WBC PRESENT,BOTH PMN AND MONONUCLEAR     RARE SQUAMOUS EPITHELIAL CELLS PRESENT     ABUNDANT GRAM POSITIVE COCCI     IN PAIRS IN CLUSTERS FEW GRAM NEGATIVE RODS     Performed at [facility]   Culture     Final   Value: Non-Pathogenic Oropharyngeal-type Flora Isolated.     Performed at [facility]   Report Status 11/28/2013 FINAL   Final  CLOSTRIDIUM DIFFICILE BY PCR     Status: None   Collection Time    11/26/13  9:08 AM      Result Value Ref Range Status   C difficile by pcr NEGATIVE  NEGATIVE Final  CULTURE, BLOOD (ROUTINE X 2)     Status: None   Collection Time    12/01/13  8:40 AM      Result Value Ref Range Status   Specimen Description BLOOD LEFT ARM   Final   Special Requests BOTTLES DRAWN AEROBIC AND ANAEROBIC 10CC   Final   Culture  Setup Time     Final   Value: 12/01/2013 13:42     Performed at [facility]   Culture     Final   Value:        BLOOD CULTURE RECEIVED NO GROWTH TO DATE CULTURE WILL BE HELD FOR 5 DAYS BEFORE ISSUING A FINAL NEGATIVE REPORT     Performed at [facility]   Report Status PENDING   Incomplete  CULTURE, BLOOD (ROUTINE X 2)     Status: None   Collection Time    12/01/13  8:50 AM      Result Value Ref Range Status   Specimen Description BLOOD LEFT HAND   Final   Special Requests BOTTLES DRAWN AEROBIC ONLY 10CC   Final   Culture  Setup Time     Final   Value: 12/01/2013 13:41     Performed at [facility]   Culture     Final   Value:        BLOOD CULTURE RECEIVED NO GROWTH TO DATE CULTURE WILL BE HELD FOR  5 DAYS BEFORE ISSUING A FINAL NEGATIVE REPORT     Performed at [facility]   Report Status PENDING   Incomplete  CULTURE, RESPIRATORY (NON-EXPECTORATED)     Status: None   Collection Time    12/01/13  9:40 AM      Result Value Ref Range Status   Specimen Description TRACHEAL ASPIRATE   Final   Special Requests NONE   Final   Gram Stain PENDING   Incomplete   Culture     Final   Value: Culture reincubated for better growth     Performed at [facility]   Report Status PENDING   Incomplete  CLOSTRIDIUM DIFFICILE BY PCR     Status: None   Collection Time    12/01/13  4:32 PM      Result Value Ref Range Status   C difficile by pcr NEGATIVE  NEGATIVE Final    Medical History: Past Medical History  Diagnosis Date  . Hypertension     a. Hypotension noted 05/2012.  Marland Kitchen Anxiety   . Depression   . Arthritis   . Irritable bowel syndrome (IBS)   . Diverticulitis   . Insomnia   . Eczema   . Gout   . GI bleed     Possible mild GI bleed 04/2011, hemoccult negative at [facility] and no colonoscopy/EGD felt necessary by GI at that time  . Ischemic cardiomyopathy     IMPROVED. a. EF 40-45% by echo at [facility] 04/2011. b. Improved to 55% by echo 05/2012.  Marland Kitchen CAD (coronary artery disease)     a. s/p two overlapping BMS to LAD 05/12/11, Occluded mid LCX with collaterals. b. NSTEMI 05/2012: PTCA/DES to mid Cx and PTCA/DES to prox-mid LAD 05/18/12.  Marland Kitchen Hyperlipidemia   . AAA (abdominal aortic aneurysm) 05/19/2012    a. Infrarenal AAA by CT at [facility] 3.4cm 05/2012.  . Tobacco abuse   . Hepatic lesion     a. Right hepatic lobe density of uncertain significance by CT at [facility] 05/2012 - needs f/u PCP.  Marland Kitchen Acute renal insufficiency     05/2012 - Cr 1.7. Improved.  . Tobacco use disorder 05/25/2012  .  GERD (gastroesophageal reflux disease) 01/18/2013  . Seizures 11/25/2013    Medications:  Prescriptions prior to admission  Medication Sig Dispense Refill  . amLODipine (NORVASC) 5 MG  tablet Take 5 mg by mouth daily.      Marland Kitchen aspirin 81 MG tablet Take 81 mg by mouth daily.      . clopidogrel (PLAVIX) 75 MG tablet Take 1 tablet (75 mg total) by mouth daily.  30 tablet  11  . FLUoxetine (PROZAC) 20 MG capsule Take 20 mg by mouth daily.      . Fluticasone-Salmeterol (ADVAIR DISKUS) 100-50 MCG/DOSE AEPB Inhale 1 puff into the lungs every 12 (twelve) hours.       . furosemide (LASIX) 20 MG tablet Take 20 mg by mouth daily.       . isosorbide mononitrate (IMDUR) 60 MG 24 hr tablet Take 1 tablet (60 mg total) by mouth daily.  30 tablet  6  . LORazepam (ATIVAN) 1 MG tablet Take 1 tablet (1 mg total) by mouth every 6 (six) hours as needed for anxiety.  30 tablet  0  . metoprolol (LOPRESSOR) 50 MG tablet Take 50 mg by mouth 2 (two) times daily.      . nitroGLYCERIN (NITROSTAT) 0.4 MG SL tablet Place 0.4 mg under the tongue every 5 (five) minutes as needed for chest pain. For chest pain      . pantoprazole (PROTONIX) 40 MG tablet Take 1 tablet (40 mg total) by mouth daily before breakfast.  30 tablet  11  . polyethylene glycol (MIRALAX / GLYCOLAX) packet Take 17 g by mouth daily.       . pravastatin (PRAVACHOL) 80 MG tablet Take 80 mg by mouth daily.       Assessment: 63 yo lady to start aztreonam for sepsis.  Levaquin completed.    Goal of Therapy:  Eradication of infection  Plan:  Aztreonam 1gm IV q8 hours F/u clinical course.  Thanks for allowing pharmacy to be a part of this patient's care.  Excell Seltzer, PharmD Clinical Pharmacist, [phone] 12/02/2013,2:50 PM

## 2013-12-02 NOTE — Progress Notes (Signed)
STROKE TEAM PROGRESS NOTE   HISTORY  63 y.o. 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 She has surrounding cytotoxic edema and midline shift and is employed left MCA aneurysm as well.    SUBJECTIVE (INTERVAL HISTORY)  No seizure activity overnight.   OBJECTIVE  Filed Vitals:   12/02/13 1000 12/02/13 1100 12/02/13 1138 12/02/13 1200  BP: 162/59 144/58 160/62 174/69  Pulse: 83 76 73 83  Temp:      TempSrc:      Resp: 30 26 27 28   Height:      Weight:      SpO2: 100% 100% 100% 100%    Recent Labs  Lab  11/16/2013 1900  11/18/2013 0245  11/20/13 0550  11/21/13 0500   NA  132*  128*  131*  131*   K  3.6*  3.7  3.2*  2.7*   CL  93*  92*  96  95*   CO2  23  20  22  22    GLUCOSE  142*  148*  109*  124*   BUN  10  11  10  9    CREATININE  0.99  0.98  0.82  0.72   CALCIUM  8.5  8.0*  7.2*  7.0*   MG  1.0*  --  --  --     Recent Labs  Lab  12/04/2013 1900  11/20/13 0550   AST  74*  49*   ALT  37*  24   ALKPHOS  122*  86   BILITOT  1.2  0.9   PROT  6.8  5.8*   ALBUMIN  3.3*  2.6*     Recent Labs  Lab  11/28/2013 2345  12/05/2013 0245  11/20/13 0550  11/21/13 0500   WBC  --  7.1  8.0  7.8   NEUTROABS  --  --  6.2  --   HGB  --  11.1*  9.6*  9.9*   HCT  --  32.0*  28.0*  28.6*   MCV  --  100.3*  99.6  99.3   PLT  205  186  248  215     Recent Labs   12/05/2013 1900   LABPROT  12.5   INR  0.93      Component  Value  Date/Time    CHOL  118  05/18/2012 0512    TRIG  90  11/21/2013 1900    HDL  66  05/18/2012 0512    CHOLHDL  1.8  05/18/2012 0512    VLDL  14  05/18/2012 0512    LDLCALC  38  05/18/2012 0512    Ct Angio Head W/cm &/or Wo Cm  11/29/2013 1. Unchanged appearance of extensive, predominantly right-sided subarachnoid hemorrhage as well as 2.7 cm hematoma in the region of the right  insula. Moderate surrounding edema with 5 mm of unchanged midline shift. 2. No right MCA aneurysm is identified, however there is severe attenuation of the right MCA and its branch vessels which may reflect local mass effect and vasospasm secondary to surrounding hemorrhage, and this could obscure an underlying right MCA aneurysm. Narrowing of the ACAs and right ICA terminus also may reflect vasospasm. Catheter angiography may be helpful for further evaluation. 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  Dg  Chest 1 View  11/21/2013 Improved aeration of the left lung base with mild basilar atelectasis remaining  11/20/2013 Slight increase in left basilar opacity most consistent with atelectasis.  11/27/2013 No significant change in mild opacity in the lateral left lung base.  12/07/2013 Endotracheal tube and nasogastric tube in appropriate position. Stable mild opacity in lateral left lung base.  Ct Head Wo Contrast  11/25/2013 Hemorrhagic right MCA infarct is stable. 6 mm midline shift unchanged. No new findings. 11/23/2013 1. Unchanged intracranial hemorrhage as above. 2. Unchanged edema in the right temporal, frontal, and parietal lobes, consistent with right MCA infarct. Similar degree of leftward midline shift. 11/21/2013 1. No significant interval change in intraparenchymal, subarachnoid, and subdural hemorrhage as compared to 11/20/13. Associated vasogenic edema is similar with stable 4-5 mm of right-to-left midline shift. No hydrocephalus. 2. No new hemorrhage.  11/20/2013 1. No change in the intracranial hemorrhage since the most recent prior study. Right frontal parenchymal hematoma is stable in size as is the degree of mass effect. Subarachnoid hemorrhage and areas of subdural hemorrhage are stable. No new intracranial hemorrhage. 2. Mass effect and midline shift ir stable. 3. No hydrocephalus.  11/30/2013 Similar extensive right greater than left subarachnoid blood, with a trace  possible posterior falcine subdural hematoma. Evolving 2.7 x 2.4 cm right insular hematoma with similar 5 mm right to left midline shift. No hydrocephalus.  TCD  11/27/2013 Limited study due to poor windows and neck strap limiting evaluation. Low normal mean flow velocities in identified vessels of anterior circulation. Globally elevated pulsatility indices suggest increase intracranial presure or diffuse intracranial atherosclerosis.  EEG LTM 11/27/13 Clinical interpretation: This 24 hours of intensive EEG monitoring with simultaneous video monitoring did not record any clinical or subclinical seizures. Background activities were marked by background activities slowing non- reactive to external stimuli, suggestive of a severe encephalopathy of nonspecific etiologies including toxic metabolic pharmacologic multifocal degenerative etiologies. Right hemisphere background slowing was more prominent and attenuated , lacking runs of faster frequencies. These Findings are suggestive of an additional neuronal dysfunction across the right hemisphere. There is no epileptiform discharges clinical or subclinical seizures present.  Clinical correlation is advised.  PHYSICAL EXAM  Filed Vitals:   12/02/13 1000 12/02/13 1100 12/02/13 1138 12/02/13 1200  BP: 162/59 144/58 160/62 174/69  Pulse: 83 76 73 83  Temp:      TempSrc:      Resp: 30 26 27 28   Height:      Weight:      SpO2: 100% 100% 100% 100%    [name] intubated. Afebrile. Head is nontraumatic. Neck is supple without bruit. Cardiac exam no murmur or gallop. Lungs are coarse rhonchi bilaterally. Distal pulses are well felt.   Neurologic Examination:  Intubated and not on sedation. Unresponsive to voice, not following commands. No response to sternal rub. Perrl. Eyes conjugate, no gaze deviation, no abnormal movements noted, perrl, no withdrawal to painful stimuli, +occulocephalic reflex, +cough on suction.    ASSESSMENT/PLAN  Ms.  Carla Hoover is a 63 y.o. female with history of CHF, depression, elevated cholesterol, COPD, MI, and CAD on aspirin and plavix s/p fall where she hit her head. [facility]ing to [facility] 11/28/2013 with left sided weakness, facial droop and headache. Intubated there and transferred to [facility]. Imaging confirms a right SAH, R IPH, with right MCA territory edema and hypodensity, concerning for right MCA stroke due to vasospasm. Developed partial seizure episodes, increased depakote and load with vimpat. Hyponatremia  with likely CNS salt wasting, on 3% saline. Intubated due to airway protection. Na up to 126 today from 119 yesterday.  Hemorrhage: R insular IPH with R perisylvian SAH & R SDH likely from R MCA aneurysm not seen on angio, now with new neurological deficits likely related to vasospasm with vasogenic edema and mass effect  aspirin 81 mg orally every day and clopidogrel 75 mg orally every day prior to admission, held now secondary to IPH Vasospasm confirmed on angiogram and treated with verapamil IA.  Concerning for right MCA stroke due to vasospasm Repeat CT stable lovenox for VTE prophylaxis  NPO  Bedrest  Resultant Left hemiplegia  Respiratory failure - intubated on no sedation - vent per CCM  Partial seizure - left UE shaking episodes, none noted overnight - depakote increased to 750mg  tid - increase vimpat maintenance dose to 100mg  bid - d/c LTM EEG  Hyponatremia - likely salt wasting - 3% saline stopped by NSG.  - BMP q4h  Cerebral vasospasm - on nimodipine - s/p IA verapamil - triple H therapy, on neo - NSG on board  Hypertension  - part of the triple-H therapy now - on neo for desired hypertension - NSG on board - hemorrhage so far stable - goal 160-180  Hyperlipidemia  Home meds: pravachol. 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 Coronary artery disease - MI, angioplasty 2013 Ischemic cardiomyopathy  Other Pertinent History  AAA, infrarenal  R hepatic lobe lesion   Georgia Dom, MD Stroke Neurology 12/02/2013 12:57 PM   To contact Stroke Continuity provider, please refer to [email or web address]  After hours, contact General Neurology

## 2013-12-02 NOTE — Progress Notes (Signed)
Patient ID: Carla Hoover, female   DOB: [birth year], 63 y.o.   MRN: [id number] Do not open her eyes. Minimal movenent of the right arm. The others with no movement. Family aware of her prognosis

## 2013-12-02 NOTE — Progress Notes (Signed)
Pt gastric residuals at 20:00 were 550 ml with abdominal distention, called E-link MD to see if residuals should be returned to pt. Dr. Joya Gaskins instructed me to dump the residuals and reduce the tube feeding from 40 ml to 20 ml/hr.   Carla Hoover

## 2013-12-02 NOTE — Progress Notes (Addendum)
PULMONARY / CRITICAL CARE MEDICINE   Name: Carla Hoover MRN: [id number] DOB: 11/30/1950    ADMISSION DATE:  11/16/2013 CONSULTATION DATE:  11/18/2013  REFERRING MD :  Erline Levine  CHIEF COMPLAINT:  Weakness  INITIAL PRESENTATION:  63 yo female fell and hit her head one day prior to admit.  Presented to [place] with Lt sided weakness and facial drop.  CT head showed ICH with SAH.  She required intubation prior to transfer to [facility].  Reintubated 9/13 for vasospasm & worsening mental status. PCCM consulted for vent/medical management. Develop  STUDIES:  9/06 CT head ([place]) 9/07 Cerebral arteriogram >> 7 mm Lt MCA Aneurysm, vasospasm of Rt MCA 9/09 CT head >> no change in ICH, SAH, SDH 9/11 CT head >> unchanged ICH or MCA infarct changes 9/13 head CT- 6 mm shift, no change in ICH  SIGNIFICANT EVENTS: 9/06 Transfer from [place] to [facility] 9/11 More lethargic, Lt side weaker >> start [facility] therapy  9/13 ETT placed, hyponatemia  SUBJECTIVE:  Febrile, low grade Remains comatose off sedative gtt Excellent UO   VITAL SIGNS: Temp:  [99.8 F (37.7 C)-100.5 F (38.1 C)] 100.3 F (37.9 C) (09/20 1200) Pulse Rate:  [66-170] 82 (09/20 1300) Resp:  [23-37] 29 (09/20 1300) BP: (123-182)/(57-102) 152/70 mmHg (09/20 1300) SpO2:  [100 %] 100 % (09/20 1300) FiO2 (%):  [30 %] 30 % (09/20 1400) Weight:  [72.4 kg (159 lb 9.8 oz)] 72.4 kg (159 lb 9.8 oz) (09/20 6578) INTAKE / OUTPUT:  Intake/Output Summary (Last 24 hours) at 12/02/13 1422 Last data filed at 12/02/13 1300  Gross per 24 hour  Intake 5412.27 ml  Output   2975 ml  Net 2437.27 ml    PHYSICAL EXAMINATION:  Gen: acutely ill, intubated, RASS-5 HEENT NG tube PULM:clear BL CV: RRR, no mgr AB: BS+, soft, nontender Ext: warm no edema Neuro:moves RUE/LE to  pain, left hemiparesis,pupils dilated reactive BL, lt plantar upgoing,does not follow commands  LABS:  CBC  Recent Labs Lab 11/30/13 0615 12/01/13 0700  12/02/13 0542  WBC 17.6* 20.7* 23.9*  HGB 9.9* 9.7* 9.4*  HCT 29.4* 29.0* 28.0*  PLT 233 240 287   Coag's No results found for this basename: APTT, INR,  in the last 168 hours BMET  Recent Labs Lab 12/01/13 0700 12/01/13 1802 12/02/13 0542  NA 139 134* 135*  K 2.6* 4.1 3.2*  CL 105 101 100  CO2 20 19 20   BUN 23 22 23   CREATININE 0.65 0.61 0.63  GLUCOSE 155* 126* 153*   Electrolytes  Recent Labs Lab 11/30/13 0615 12/01/13 0700 12/01/13 1802 12/02/13 0542  CALCIUM 8.6 8.4 8.8 8.5  MG 1.3* 1.6  --  1.3*  PHOS 3.5 3.1  --  2.9    ABG  Recent Labs Lab 11/25/13 1540  PHART 7.458*  PCO2ART 26.6*  PO2ART 130.0*   Liver Enzymes  Recent Labs Lab 11/26/13 0435  AST 86*  ALT 48*  ALKPHOS 88  BILITOT 0.3  ALBUMIN 2.2*   Imaging Dg Chest Port 1 View  12/01/2013   CLINICAL DATA:  Pneumonitis.  EXAM: PORTABLE CHEST - 1 VIEW  COMPARISON:  1915  FINDINGS: The patient is rotated to the left. Endotracheal tube tip projects approximately 2 cm above the carina. Left jugular central venous catheter remains in place with tip overlying the mid upper SVC. Weighted enteric tube is partially visualized, again looped in the left upper abdomen with tip directed cranially in the region of the gastric  cardia. Cardiac silhouette remains mildly enlarged. Lung volumes do not appear significantly changed with persistent, patchy bibasilar opacities. Small left pleural effusion is questioned. No pneumothorax is identified.  IMPRESSION: 1. Endotracheal tube 2 cm above the carina. 2. Mild bibasilar atelectasis. Possible small left pleural effusion.   Electronically Signed   By: Logan Bores   On: 12/01/2013 07:27     ASSESSMENT / PLAN: NEUROLOGIC A:   ICH with SAH with concern for vasospasm/MCA stroke  Hx of anxiety, depression, insomnia  Hx of ETOH abuse Seizure likely, cEEG did not show P:   -Valproate for seizure prevention per neurology --nimodipine x 21 days -Continue  simvastatin -Continue HHH therapy >> goal SBP 160 to 180 with neo gtt- defer to neuro sx if we can stop this based on TCDs - cvp to goal 10,ct NS @ 75/h -Continue prozac -Thiamine, folic acid -Unfortunately mental status has not improved   PULMONARY OETT 9/06 >> 9/09, 9/13 >> A: Acute respiratory failure in setting of ICH Bibasilar Atelectasis with mild hypoxemia Hx of COPD, tobacco abuse P:   - Oxygen to keep SpO2 > 92% - PRN albuterol -SBTs but no extubation   CARDIOVASCULAR aline 9/11 >> CVL 9/11 >> A:  Hx of CAD, hyperlipidemia, HTN, chronic systolic heart failure, AAA. P:  -Hold ASA, plavix in setting of ICH until okay with neurology/neurosurgery -Hold lopressor, imdur, lasix while getting HHH therapy -neo as needed for goal SBP 160-180 vs MAP 80-105 -Continue simvastatin (uses pravachol as outpt)   RENAL A:   Hyponatremia Decline in Na with associated seizures  R/o siadh (as limited progress and na drop on nacl), doubt CSW Hypokaleia/hypomag P:   ct florinef NS @ 75/h to goal CVp 10 bmet daily Replete lytes   GASTROINTESTINAL A:   Protein calorie malnutrition Dysphagia Hx of IBS Diarrhea, c diff neg  P:   -Protonix for SUP -Panda tube for meds, TFs -imodium ok  HEMATOLOGIC A:   Anemia of critical illness. P:  -F/u CBC  INFECTIOUS A:   Asp likely fever & rising WC ? Line sepsis 9/19 vanc >> 9/20 aztreonam >> P:   -ct  Levo x 7ds total ( pcn all) Sputum cx 9/13 >> ng Panculture - Dc levaquin, add aztreonam/ vanc - will dc CVL & place PICC  ENDOCRINE A:   Hyperglycemia   P:   - ssi  TODAY'S [place] therapy continues for vasospasm post SAH, prognosis for meaningful recovery guarded given poor neuro exam but too many confounders -fever/?infection / seizure for accurate prognostication Discussed with son in detail 9/19 - will plan for trach/ PEG next week if he wants to proceed   Care during the described time interval was provided  by me and/or other providers on the critical care team.  I have reviewed this patient's available data, including medical history, events of note, physical examination and test results as part of my evaluation  CC time x 27m  Kara Mead MD. FCCP. [place] [facility] Pager [phone] If no response call [phone]

## 2013-12-03 ENCOUNTER — Inpatient Hospital Stay (HOSPITAL_COMMUNITY): Payer: Medicare Other

## 2013-12-03 LAB — GLUCOSE, CAPILLARY
GLUCOSE-CAPILLARY: 134 mg/dL — AB (ref 70–99)
GLUCOSE-CAPILLARY: 125 mg/dL — AB (ref 70–99)
Glucose-Capillary: 101 mg/dL — ABNORMAL HIGH (ref 70–99)
Glucose-Capillary: 110 mg/dL — ABNORMAL HIGH (ref 70–99)
Glucose-Capillary: 125 mg/dL — ABNORMAL HIGH (ref 70–99)
Glucose-Capillary: 128 mg/dL — ABNORMAL HIGH (ref 70–99)

## 2013-12-03 LAB — BASIC METABOLIC PANEL
ANION GAP: 15 (ref 5–15)
BUN: 25 mg/dL — ABNORMAL HIGH (ref 6–23)
CHLORIDE: 101 meq/L (ref 96–112)
CO2: 18 mEq/L — ABNORMAL LOW (ref 19–32)
Calcium: 8.5 mg/dL (ref 8.4–10.5)
Creatinine, Ser: 0.69 mg/dL (ref 0.50–1.10)
GFR calc non Af Amer: 89 mL/min — ABNORMAL LOW (ref >90)
Glucose, Bld: 127 mg/dL — ABNORMAL HIGH (ref 70–99)
POTASSIUM: 3 meq/L — AB (ref 3.7–5.3)
SODIUM: 134 meq/L — AB (ref 137–147)

## 2013-12-03 LAB — CBC
HCT: 26 % — ABNORMAL LOW (ref 36.0–46.0)
HEMOGLOBIN: 9 g/dL — AB (ref 12.0–15.0)
MCH: 34.2 pg — ABNORMAL HIGH (ref 26.0–34.0)
MCHC: 34.6 g/dL (ref 30.0–36.0)
MCV: 98.9 fL (ref 78.0–100.0)
Platelets: 256 10*3/uL (ref 150–400)
RBC: 2.63 MIL/uL — AB (ref 3.87–5.11)
RDW: 13.6 % (ref 11.5–15.5)
WBC: 21.2 10*3/uL — AB (ref 4.0–10.5)

## 2013-12-03 MED ORDER — SODIUM CHLORIDE 0.9 % IJ SOLN
10.0000 mL | Freq: Two times a day (BID) | INTRAMUSCULAR | Status: DC
  Administered 2013-12-03 – 2013-12-06 (×7): 10 mL

## 2013-12-03 MED ORDER — POTASSIUM CHLORIDE 20 MEQ/15ML (10%) PO LIQD
30.0000 meq | ORAL | Status: AC
  Administered 2013-12-03 (×2): 30 meq
  Filled 2013-12-03 (×2): qty 30

## 2013-12-03 MED ORDER — SODIUM CHLORIDE 0.9 % IJ SOLN: 10.0000 mL | INTRAMUSCULAR | Status: DC | PRN

## 2013-12-03 NOTE — Progress Notes (Addendum)
Peripherally Inserted Central Catheter/Midline Placement  The IV Nurse has discussed with the patient and/or persons authorized to consent for the patient, the purpose of this procedure and the potential benefits and risks involved with this procedure.  The benefits include less needle sticks, lab draws from the catheter and patient may be discharged home with the catheter.  Risks include, but not limited to, infection, bleeding, blood clot (thrombus formation), and puncture of an artery; nerve damage and irregular heat beat.  Alternatives to this procedure were also discussed.  Consent obtained by Claretha Cooper, RN via telephone from [name], patient's son.  PICC/Midline Placement Documentation        Carla Hoover, Carla Hoover 12/03/2013, 3:36 PM

## 2013-12-03 NOTE — Progress Notes (Signed)
Subjective: Patient reports (vent support)  Objective: Vital signs in last 24 hours: Temp:  [98.1 F (36.7 C)-100.5 F (38.1 C)] 99.2 F (37.3 C) (09/21 0400) Pulse Rate:  [45-165] 100 (09/21 0700) Resp:  [15-35] 30 (09/21 0700) BP: (120-174)/(48-111) 127/64 mmHg (09/21 0700) SpO2:  [100 %] 100 % (09/21 0700) FiO2 (%):  [30 %] 30 % (09/21 0022) Weight:  [74.3 kg (163 lb 12.8 oz)] 74.3 kg (163 lb 12.8 oz) (09/21 0500)  Intake/Output from previous day: 09/20 0701 - 09/21 0700 In: 4208.4 [I.V.:2295.9; NG/GT:1120; IV Piggyback:792.5] Out: 2125 [Urine:2125] Intake/Output this shift:    Unresponsive, not withdrawing from pain this AM. PEARL. Not tolerating tube feedings.    Lab Results:  Recent Labs  12/02/13 0542 12/03/13 0550  WBC 23.9* 21.2*  HGB 9.4* 9.0*  HCT 28.0* 26.0*  PLT 287 256   BMET  Recent Labs  12/02/13 0542 12/03/13 0550  NA 135* 134*  K 3.2* 3.0*  CL 100 101  CO2 20 18*  GLUCOSE 153* 127*  BUN 23 25*  CREATININE 0.63 0.69  CALCIUM 8.5 8.5    Studies/Results: Dg Chest Port 1 View  12/03/2013   CLINICAL DATA:  Pneumonitis  EXAM: PORTABLE CHEST - 1 VIEW  COMPARISON:  12/02/2013  FINDINGS: The endotracheal tube, feeding catheter and left jugular central line are again seen and stable. The endotracheal tube is again 3.7 cm above the carina. The lungs are well aerated bilaterally. Very minimal left basilar atelectasis is seen. The overall appearance has improved slightly in the interval from the prior exam with decreased effusion.  IMPRESSION: Mild persistent left basilar changes.  Tubes and lines as described.   Electronically Signed   By: Inez Catalina M.D.   On: 12/03/2013 07:43   Dg Chest Port 1 View  12/02/2013   CLINICAL DATA:  Intubated.  Followup atelectasis.  EXAM: PORTABLE CHEST - 1 VIEW  COMPARISON:  Yesterday.  FINDINGS: The endotracheal tube is in satisfactory position. Feeding tube tip in the proximal stomach. Left jugular catheter tip in the  proximal superior vena cava. Pneumothorax. Interval apparent elevation of the left hemidiaphragm without significant change in mild bibasilar atelectasis. Continued evidence of a left pleural effusion. Normal sized heart. Unremarkable bones.  IMPRESSION: Left pleural effusion with interval elevation of the left hemidiaphragm or subpulmonic pleural fluid and stable mild bibasilar atelectasis.   Electronically Signed   By: Enrique Sack M.D.   On: 12/02/2013 07:08    Assessment/Plan:   LOS: 15 days  Continue support. PEG?/Trach? discussions in progress   Verdis Prime 12/03/2013, 7:49 AM    Patient minimally responsive.  Withdraws right side greater than left.  Not following commands.  At this point, prognosis for meaningful recovery dismal and I would recommend palliative care and not trach and PEG.  At best, patient will be hemiplegic and in a nursing home and is more likely to remain unresponsive.

## 2013-12-03 NOTE — Progress Notes (Signed)
PULMONARY / CRITICAL CARE MEDICINE   Name: Carla Hoover MRN: [id number] DOB: 1950/07/15    ADMISSION DATE:  12/06/2013 CONSULTATION DATE:  11/18/2013  REFERRING MD :  Erline Levine  CHIEF COMPLAINT:  Weakness  INITIAL PRESENTATION:  63 yo female fell and hit her head one day prior to admit.  Presented to [place] with Lt sided weakness and facial drop.  CT head showed ICH with SAH.  She required intubation prior to transfer to [facility]  Reintubated 9/13 for vasospasm & worsening mental status. PCCM consulted for vent/medical management. Develop  STUDIES:  9/06 CT head ([place]) 9/07 Cerebral arteriogram >> 7 mm Lt MCA Aneurysm, vasospasm of Rt MCA 9/09 CT head >> no change in ICH, SAH, SDH 9/11 CT head >> unchanged ICH or MCA infarct changes 9/13 head CT- 6 mm shift, no change in ICH  SIGNIFICANT EVENTS: 9/06 Transfer from [place] to [facility] 9/11 More lethargic, Lt side weaker >> start [facility] therapy  9/13 ETT placed, hyponatemia  SUBJECTIVE:  No events overnight, unresponsive  VITAL SIGNS: Temp:  [98.1 F (36.7 C)-100.3 F (37.9 C)] 99.5 F (37.5 C) (09/21 0752) Pulse Rate:  [45-165] 94 (09/21 0930) Resp:  [15-35] 32 (09/21 0930) BP: (120-174)/(48-111) 134/61 mmHg (09/21 0930) SpO2:  [100 %] 100 % (09/21 0930) FiO2 (%):  [30 %] 30 % (09/21 0800) Weight:  [74.3 kg (163 lb 12.8 oz)] 74.3 kg (163 lb 12.8 oz) (09/21 0500) INTAKE / OUTPUT:  Intake/Output Summary (Last 24 hours) at 12/03/13 0939 Last data filed at 12/03/13 0813  Gross per 24 hour  Intake 3961.28 ml  Output   1925 ml  Net 2036.28 ml   PHYSICAL EXAMINATION:  Gen: acutely ill, intubated, RASS-5 HEENT NG tube PULM:clear BL CV: RRR, no mgr AB: BS+, soft, nontender Ext: warm no edema Neuro:moves RUE/LE to  pain, left hemiparesis,pupils dilated reactive BL, lt plantar upgoing,does not follow commands  LABS:  CBC  Recent Labs Lab 12/01/13 0700 12/02/13 0542 12/03/13 0550  WBC 20.7* 23.9*  21.2*  HGB 9.7* 9.4* 9.0*  HCT 29.0* 28.0* 26.0*  PLT 240 287 256   Coag's No results found for this basename: APTT, INR,  in the last 168 hours BMET  Recent Labs Lab 12/01/13 1802 12/02/13 0542 12/03/13 0550  NA 134* 135* 134*  K 4.1 3.2* 3.0*  CL 101 100 101  CO2 19 20 18*  BUN 22 23 25*  CREATININE 0.61 0.63 0.69  GLUCOSE 126* 153* 127*   Electrolytes  Recent Labs Lab 11/30/13 0615 12/01/13 0700 12/01/13 1802 12/02/13 0542 12/03/13 0550  CALCIUM 8.6 8.4 8.8 8.5 8.5  MG 1.3* 1.6  --  1.3*  --   PHOS 3.5 3.1  --  2.9  --     ABG No results found for this basename: PHART, PCO2ART, PO2ART,  in the last 168 hours Liver Enzymes No results found for this basename: AST, ALT, ALKPHOS, BILITOT, ALBUMIN,  in the last 168 hours Imaging Dg Chest Port 1 View  12/02/2013   CLINICAL DATA:  Intubated.  Followup atelectasis.  EXAM: PORTABLE CHEST - 1 VIEW  COMPARISON:  Yesterday.  FINDINGS: The endotracheal tube is in satisfactory position. Feeding tube tip in the proximal stomach. Left jugular catheter tip in the proximal superior vena cava. Pneumothorax. Interval apparent elevation of the left hemidiaphragm without significant change in mild bibasilar atelectasis. Continued evidence of a left pleural effusion. Normal sized heart. Unremarkable bones.  IMPRESSION: Left pleural effusion with interval elevation  of the left hemidiaphragm or subpulmonic pleural fluid and stable mild bibasilar atelectasis.   Electronically Signed   By: Enrique Sack M.D.   On: 12/02/2013 07:08   ASSESSMENT / PLAN: NEUROLOGIC A:   ICH with SAH with concern for vasospasm/MCA stroke  Hx of anxiety, depression, insomnia  Hx of ETOH abuse Seizure likely, cEEG did not show P:   -Valproate for seizure prevention per neurology -Nimodipine x 21 days -Continue simvastatin -Continue HHH therapy >> goal SBP 160 to 180 with neo gtt- defer to neuro sx if we can stop this based on TCDs -CVP to goal 10,ct NS @  75/h -Continue prozac -Thiamine, folic acid -Unfortunately mental status has not improved, communicated with NS and neuro, recommend withdrawal at this point.  PULMONARY OETT 9/06 >> 9/09, 9/13 >> A: Acute respiratory failure in setting of ICH Bibasilar Atelectasis with mild hypoxemia Hx of COPD, tobacco abuse P:   - Oxygen to keep SpO2 > 92% - PRN albuterol - PS trials but no extubation given mental status, recommend against trach/peg at this point.  CARDIOVASCULAR aline 9/11 >> CVL 9/11 >> A:  Hx of CAD, hyperlipidemia, HTN, chronic systolic heart failure, AAA. P:  -Hold ASA, plavix in setting of ICH until okay with neurology/neurosurgery -Hold lopressor, imdur, lasix while getting HHH therapy -neo as needed for goal SBP 160-180 vs MAP 80-105 -Continue simvastatin (uses pravachol as outpt)   RENAL A:   Hyponatremia Decline in Na with associated seizures  R/o siadh (as limited progress and na drop on nacl), doubt CSW Hypokaleia/hypomag P:   ct florinef NS @ 75/h to goal CVP 10 bmet daily Replete lytes as indicated  GASTROINTESTINAL A:   Protein calorie malnutrition Dysphagia Hx of IBS Diarrhea, c diff neg  P:   -Protonix for SUP -Panda tube for meds, TFs -imodium ok  HEMATOLOGIC A:   Anemia of critical illness. P:  -F/u CBC  INFECTIOUS A:   Asp likely fever & rising WC ? Line sepsis 9/19 vanc >> 9/20 aztreonam >> P:   -ct  Levo x 7ds total ( pcn all) Sputum cx 9/13 >> ng Panculture - Dc levaquin, add aztreonam/ vanc - will dc CVL & place PICC  ENDOCRINE A:   Hyperglycemia   P:   - ssi  TODAY'S SUMMARY: Neuro speaking with family regarding plan of care, trach/peg not indicated and will not be offered at this point.  Care during the described time interval was provided by me and/or other providers on the critical care team.  I have reviewed this patient's available data, including medical history, events of note, physical examination and test  results as part of my evaluation  CC time x 76m  Rush Farmer, M.D. [facility] Pulmonary/Critical Care Medicine. Pager: [phone]. After hours pager: [phone].

## 2013-12-03 NOTE — Progress Notes (Signed)
STROKE TEAM PROGRESS NOTE   HISTORY  63 y.o. 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 She has surrounding cytotoxic edema and midline shift and is employed left MCA aneurysm as well.    SUBJECTIVE (INTERVAL HISTORY)  Remains unresponsive. Neosynephrine drip has been tapered off. Patient's family remains unrealistic and son wants continued aggressive support despite discussion with multiple physicians about poor prognosis  OBJECTIVE  Filed Vitals:   12/03/13 1030 12/03/13 1120 12/03/13 1134 12/03/13 1200  BP: 138/53 138/53  119/60  Pulse: 105 131  108  Temp:   99.5 F (37.5 C)   TempSrc:   Axillary   Resp: 35 36  34  Height:      Weight:      SpO2: 100% 100%  100%    Recent Labs  Lab  11/28/2013 1900  11/27/2013 0245  11/20/13 0550  11/21/13 0500   NA  132*  128*  131*  131*   K  3.6*  3.7  3.2*  2.7*   CL  93*  92*  96  95*   CO2  23  20  22  22    GLUCOSE  142*  148*  109*  124*   BUN  10  11  10  9    CREATININE  0.99  0.98  0.82  0.72   CALCIUM  8.5  8.0*  7.2*  7.0*   MG  1.0*  --  --  --     Recent Labs  Lab  12/09/2013 1900  11/20/13 0550   AST  74*  49*   ALT  37*  24   ALKPHOS  122*  86   BILITOT  1.2  0.9   PROT  6.8  5.8*   ALBUMIN  3.3*  2.6*     Recent Labs  Lab  11/17/2013 2345  11/29/2013 0245  11/20/13 0550  11/21/13 0500   WBC  --  7.1  8.0  7.8   NEUTROABS  --  --  6.2  --   HGB  --  11.1*  9.6*  9.9*   HCT  --  32.0*  28.0*  28.6*   MCV  --  100.3*  99.6  99.3   PLT  205  186  248  215     Recent Labs   11/25/2013 1900   LABPROT  12.5   INR  0.93      Component  Value  Date/Time    CHOL  118  05/18/2012 0512    TRIG  90  11/22/2013 1900    HDL  66  05/18/2012 0512    CHOLHDL  1.8  05/18/2012 0512    VLDL  14  05/18/2012 0512    LDLCALC  38  05/18/2012  0512    Ct Angio Head W/cm &/or Wo Cm  11/17/2013 1. Unchanged appearance of extensive, predominantly right-sided subarachnoid hemorrhage as well as 2.7 cm hematoma in the region of the right insula. Moderate surrounding edema with 5 mm of unchanged midline shift. 2. No right MCA aneurysm is identified, however there is severe attenuation of the right MCA and its branch vessels which may reflect local mass effect and vasospasm secondary to surrounding hemorrhage, and this could obscure an underlying right MCA aneurysm. Narrowing of the ACAs and right ICA terminus also may  reflect vasospasm. Catheter angiography may be helpful for further evaluation. 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  Dg Chest 1 View  11/21/2013 Improved aeration of the left lung base with mild basilar atelectasis remaining  11/20/2013 Slight increase in left basilar opacity most consistent with atelectasis.  12/07/2013 No significant change in mild opacity in the lateral left lung base.  11/28/2013 Endotracheal tube and nasogastric tube in appropriate position. Stable mild opacity in lateral left lung base.  Ct Head Wo Contrast  11/25/2013 Hemorrhagic right MCA infarct is stable. 6 mm midline shift unchanged. No new findings. 11/23/2013 1. Unchanged intracranial hemorrhage as above. 2. Unchanged edema in the right temporal, frontal, and parietal lobes, consistent with right MCA infarct. Similar degree of leftward midline shift. 11/21/2013 1. No significant interval change in intraparenchymal, subarachnoid, and subdural hemorrhage as compared to 11/20/13. Associated vasogenic edema is similar with stable 4-5 mm of right-to-left midline shift. No hydrocephalus. 2. No new hemorrhage.  11/20/2013 1. No change in the intracranial hemorrhage since the most recent prior study. Right frontal parenchymal hematoma is stable in size as is the degree of mass effect. Subarachnoid hemorrhage and areas of subdural hemorrhage are  stable. No new intracranial hemorrhage. 2. Mass effect and midline shift ir stable. 3. No hydrocephalus.  11/16/2013 Similar extensive right greater than left subarachnoid blood, with a trace possible posterior falcine subdural hematoma. Evolving 2.7 x 2.4 cm right insular hematoma with similar 5 mm right to left midline shift. No hydrocephalus.  TCD  12/11/2013 Limited study due to poor windows and neck strap limiting evaluation. Low normal mean flow velocities in identified vessels of anterior circulation. Globally elevated pulsatility indices suggest increase intracranial presure or diffuse intracranial atherosclerosis.  EEG LTM 11/27/13 Clinical interpretation: This 24 hours of intensive EEG monitoring with simultaneous video monitoring did not record any clinical or subclinical seizures. Background activities were marked by background activities slowing non- reactive to external stimuli, suggestive of a severe encephalopathy of nonspecific etiologies including toxic metabolic pharmacologic multifocal degenerative etiologies. Right hemisphere background slowing was more prominent and attenuated , lacking runs of faster frequencies. These Findings are suggestive of an additional neuronal dysfunction across the right hemisphere. There is no epileptiform discharges clinical or subclinical seizures present.  Clinical correlation is advised.  PHYSICAL EXAM  Filed Vitals:   12/03/13 1030 12/03/13 1120 12/03/13 1134 12/03/13 1200  BP: 138/53 138/53  119/60  Pulse: 105 131  108  Temp:   99.5 F (37.5 C)   TempSrc:   Axillary   Resp: 35 36  34  Height:      Weight:      SpO2: 100% 100%  100%    [name] intubated. Afebrile. Head is nontraumatic. Neck is supple without bruit. Cardiac exam no murmur or gallop. Lungs are coarse rhonchi bilaterally. Distal pulses are well felt.   Neurologic Examination:  Intubated and not on sedation. Unresponsive to voice, not following commands. No  response to sternal rub. Perrl. Eyes conjugate, no gaze deviation,+occulocephalic reflex, +cough on suction. no abnormal movements noted,   withdrawal to painful stimuli, on right and not in LUE and mildly in LLE  ASSESSMENT/PLAN  Carla Hoover is a 63 y.o. female with history of CHF, depression, elevated cholesterol, COPD, MI, and CAD on aspirin and plavix s/p fall where she hit her head. Presenting to [facility] 11/16/2013 with left sided weakness, facial droop and headache. Intubated there and transferred to  [facility]. Imaging confirms a right SAH, R IPH, with right MCA territory edema and hypodensity, concerning for right MCA stroke due to vasospasm. Developed partial seizure episodes, increased depakote and load with vimpat. Hyponatremia with likely CNS salt wasting, on 3% saline. Intubated due to airway protection. Na up to 126 today from 119 yesterday.  Hemorrhage: R insular IPH with R perisylvian SAH & R SDH likely from R MCA aneurysm not seen on angio, now with new neurological deficits likely related to vasospasm with vasogenic edema and mass effect  aspirin 81 mg orally every day and clopidogrel 75 mg orally every day prior to admission, held now secondary to IPH Vasospasm confirmed on angiogram and treated with verapamil IA.  Concerning for right MCA stroke due to vasospasm Repeat CT stable lovenox for VTE prophylaxis  NPO  Bedrest  Resultant Left hemiplegia I had a long telephone discussion with the patient's son about her prognosis and the likely need for tracheostomy, PEG tube placement to facilitate her nursing care but she is likely to have significant neurological disability and need 24 hour nursing care for a long period of time. I also discussed DO NOT RESUSCITATE status with the patient's son would like more time to think about this and would like to pursue aggressive medical care for now  Respiratory failure - intubated on no sedation - vent per CCM  Partial seizure -  left UE shaking episodes, none noted overnight - depakote increased to 750mg  tid - increase vimpat maintenance dose to 100mg  bid - d/c LTM EEG  Hyponatremia - likely salt wasting - 3% saline stopped by NSG.  - BMP q4h  Cerebral vasospasm - on nimodipine - s/p IA verapamil - triple H therapy, on neo - NSG on board Serial TCDs  suboptimal but do not show vasospam  Hypertension  - part of the triple-H therapy now - on neo for desired hypertension - NSG on board - hemorrhage so far stable - goal 160-180  Hyperlipidemia  Home meds: pravachol. 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 Coronary artery disease - MI, angioplasty 2013 Ischemic cardiomyopathy  Other Pertinent History  AAA, infrarenal  R hepatic lobe lesion  This patient is critically ill and at significant risk of neurological worsening, death and care requires constant monitoring of vital signs, hemodynamics,respiratory and cardiac monitoring,review of multiple databases, neurological assessment, discussion with family, other specialists and medical decision making of high complexity.I have made any additions or clarifications directly to the above note.  I spent 30 minutes of neurocritical care time  in the care of  this patient.D/W son and Drs Vertell Limber & Beverely Risen, MD Stroke Neurology 12/03/2013 2:47 PM   To contact Stroke Continuity provider, please refer to [email or web address]  After hours, contact General Neurology

## 2013-12-03 NOTE — Progress Notes (Signed)
[facility] ADULT ICU REPLACEMENT PROTOCOL FOR AM LAB REPLACEMENT ONLY  The patient does apply for the [facility] Adult ICU Electrolyte Replacment Protocol based on the criteria listed below:   1. Is GFR >/= 40 ml/min? Yes.    Patient's GFR today is >90 2. Is urine output >/= 0.5 ml/kg/hr for the last 6 hours? Yes.   Patient's UOP is 1.5 ml/kg/hr 3. Is BUN < 60 mg/dL? Yes.    Patient's BUN today is 25 4. Abnormal electrolyte(s):  K 3.0 5. Ordered repletion with: per protocol 6. If a panic level lab has been reported, has the CCM MD in charge been notified? No..   Physician:    Ronda Fairly A 12/03/2013 6:33 AM

## 2013-12-03 NOTE — Progress Notes (Signed)
VASCULAR LAB PRELIMINARY  PRELIMINARY  PRELIMINARY  PRELIMINARY  Transcranial Doppler  Date POD PCO2 HCT BP  MCA ACA PCA OPHT SIPH VERT Basilar  11/15/2013 SB     Right  Left   40  26   --  -30   --  --   21  18   30  31    --  --     --    11-21-13 hc     Right  Left   -  -   -  -   -  -   30  23   -39  56   -44  -47   -45      11-23-13 fgn     Right  Left   -  -   -  -   -  -   22  24   35  24   31  27           11-26-13 hc     Right  Left   -  -   -  -   -  -   29  21   35  64   ####  -38     -50    11-28-13 SB      Right  Left   14  36   --  -39   --  --   19  29   34  62   --  -25     -71    11-30-13 SB     Right  Left   35  --   --  --   --  --   23  29   32  38   --  --     -73    12/03/13 MS     Right  Left   *  26   *  -15   *  19   15  22    44  34   -29  *   *       MCA = Middle Cerebral Artery      OPHT = Opthalmic Artery     BASILAR = Basilar Artery   ACA = Anterior Cerebral Artery     SIPH = Carotid Siphon PCA = Posterior Cerebral Artery   VERT = Verterbral Artery                   Normal MCA = 62+\-12 ACA = 50+\-12 PCA = 42+\-23   *Unable to insonate.  12/03/2013 3:48 PM Maudry Mayhew, RVT, RDCS, RDMS

## 2013-12-04 LAB — GLUCOSE, CAPILLARY
GLUCOSE-CAPILLARY: 131 mg/dL — AB (ref 70–99)
GLUCOSE-CAPILLARY: 121 mg/dL — AB (ref 70–99)
GLUCOSE-CAPILLARY: 148 mg/dL — AB (ref 70–99)
GLUCOSE-CAPILLARY: 123 mg/dL — AB (ref 70–99)
GLUCOSE-CAPILLARY: 102 mg/dL — AB (ref 70–99)
Glucose-Capillary: 151 mg/dL — ABNORMAL HIGH (ref 70–99)
Glucose-Capillary: 105 mg/dL — ABNORMAL HIGH (ref 70–99)

## 2013-12-04 LAB — BASIC METABOLIC PANEL
Anion gap: 14 (ref 5–15)
BUN: 32 mg/dL — AB (ref 6–23)
CHLORIDE: 106 meq/L (ref 96–112)
CO2: 17 mEq/L — ABNORMAL LOW (ref 19–32)
Calcium: 8.5 mg/dL (ref 8.4–10.5)
Creatinine, Ser: 0.73 mg/dL (ref 0.50–1.10)
GFR calc Af Amer: >90 mL/min (ref >90)
GFR calc non Af Amer: 88 mL/min — ABNORMAL LOW (ref >90)
GLUCOSE: 92 mg/dL (ref 70–99)
POTASSIUM: 3.3 meq/L — AB (ref 3.7–5.3)
Sodium: 137 mEq/L (ref 137–147)

## 2013-12-04 LAB — CULTURE, RESPIRATORY

## 2013-12-04 LAB — CULTURE, RESPIRATORY W GRAM STAIN

## 2013-12-04 NOTE — Progress Notes (Signed)
STROKE TEAM PROGRESS NOTE   HISTORY  63 y.o. 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 She has surrounding cytotoxic edema and midline shift and is employed left MCA aneurysm as well.    SUBJECTIVE (INTERVAL HISTORY)  Remains unresponsive.  Patient's family remains unrealistic and son wants continued aggressive support despite discussion with multiple physicians about poor prognosis. I spoke with the patient's son yesterday and advised him to set up a family meeting but this has not yet been done.  OBJECTIVE  Filed Vitals:   12/04/13 1145 12/04/13 1150 12/04/13 1200 12/04/13 1234  BP:   117/55 117/55  Pulse: 77  101 102  Temp:  97 F (36.1 C)    TempSrc:  Axillary    Resp: 26  30 27   Height:      Weight:      SpO2: 100%  100% 97%    Recent Labs  Lab  12/09/2013 1900  11/20/2013 0245  11/20/13 0550  11/21/13 0500   NA  132*  128*  131*  131*   K  3.6*  3.7  3.2*  2.7*   CL  93*  92*  96  95*   CO2  23  20  22  22    GLUCOSE  142*  148*  109*  124*   BUN  10  11  10  9    CREATININE  0.99  0.98  0.82  0.72   CALCIUM  8.5  8.0*  7.2*  7.0*   MG  1.0*  --  --  --     Recent Labs  Lab  11/30/2013 1900  11/20/13 0550   AST  74*  49*   ALT  37*  24   ALKPHOS  122*  86   BILITOT  1.2  0.9   PROT  6.8  5.8*   ALBUMIN  3.3*  2.6*     Recent Labs  Lab  11/26/2013 2345  12/12/2013 0245  11/20/13 0550  11/21/13 0500   WBC  --  7.1  8.0  7.8   NEUTROABS  --  --  6.2  --   HGB  --  11.1*  9.6*  9.9*   HCT  --  32.0*  28.0*  28.6*   MCV  --  100.3*  99.6  99.3   PLT  205  186  248  215     Recent Labs   12/04/2013 1900   LABPROT  12.5   INR  0.93      Component  Value  Date/Time    CHOL  118  05/18/2012 0512    TRIG  90  12/09/2013 1900    HDL  66  05/18/2012 0512    CHOLHDL  1.8   05/18/2012 0512    VLDL  14  05/18/2012 0512    LDLCALC  38  05/18/2012 0512    Ct Angio Head W/cm &/or Wo Cm  11/15/2013 1. Unchanged appearance of extensive, predominantly right-sided subarachnoid hemorrhage as well as 2.7 cm hematoma in the region of the right insula. Moderate surrounding edema with 5 mm of unchanged midline shift. 2. No right MCA aneurysm is identified, however there is severe attenuation of the right MCA and its branch vessels which may reflect local mass effect and vasospasm secondary to surrounding hemorrhage, and  this could obscure an underlying right MCA aneurysm. Narrowing of the ACAs and right ICA terminus also may reflect vasospasm. Catheter angiography may be helpful for further evaluation. 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  Dg Chest 1 View  11/21/2013 Improved aeration of the left lung base with mild basilar atelectasis remaining  11/20/2013 Slight increase in left basilar opacity most consistent with atelectasis.  11/14/2013 No significant change in mild opacity in the lateral left lung base.  12/09/2013 Endotracheal tube and nasogastric tube in appropriate position. Stable mild opacity in lateral left lung base.  Ct Head Wo Contrast  11/25/2013 Hemorrhagic right MCA infarct is stable. 6 mm midline shift unchanged. No new findings. 11/23/2013 1. Unchanged intracranial hemorrhage as above. 2. Unchanged edema in the right temporal, frontal, and parietal lobes, consistent with right MCA infarct. Similar degree of leftward midline shift. 11/21/2013 1. No significant interval change in intraparenchymal, subarachnoid, and subdural hemorrhage as compared to 11/20/13. Associated vasogenic edema is similar with stable 4-5 mm of right-to-left midline shift. No hydrocephalus. 2. No new hemorrhage.  11/20/2013 1. No change in the intracranial hemorrhage since the most recent prior study. Right frontal parenchymal hematoma is stable in size as is the degree of mass  effect. Subarachnoid hemorrhage and areas of subdural hemorrhage are stable. No new intracranial hemorrhage. 2. Mass effect and midline shift ir stable. 3. No hydrocephalus.  12/07/2013 Similar extensive right greater than left subarachnoid blood, with a trace possible posterior falcine subdural hematoma. Evolving 2.7 x 2.4 cm right insular hematoma with similar 5 mm right to left midline shift. No hydrocephalus.  TCD  11/22/2013 Limited study due to poor windows and neck strap limiting evaluation. Low normal mean flow velocities in identified vessels of anterior circulation. Globally elevated pulsatility indices suggest increase intracranial presure or diffuse intracranial atherosclerosis.  EEG LTM 11/27/13 Clinical interpretation: This 24 hours of intensive EEG monitoring with simultaneous video monitoring did not record any clinical or subclinical seizures. Background activities were marked by background activities slowing non- reactive to external stimuli, suggestive of a severe encephalopathy of nonspecific etiologies including toxic metabolic pharmacologic multifocal degenerative etiologies. Right hemisphere background slowing was more prominent and attenuated , lacking runs of faster frequencies. These Findings are suggestive of an additional neuronal dysfunction across the right hemisphere. There is no epileptiform discharges clinical or subclinical seizures present.  Clinical correlation is advised.  PHYSICAL EXAM  Filed Vitals:   12/04/13 1145 12/04/13 1150 12/04/13 1200 12/04/13 1234  BP:   117/55 117/55  Pulse: 77  101 102  Temp:  97 F (36.1 C)    TempSrc:  Axillary    Resp: 26  30 27   Height:      Weight:      SpO2: 100%  100% 97%    [name] intubated. Afebrile. Head is nontraumatic. Neck is supple without bruit. Cardiac exam no murmur or gallop. Lungs are coarse rhonchi bilaterally. Distal pulses are well felt.   Neurologic Examination:  Intubated and not on  sedation. Unresponsive to voice, not following commands. Minimal response to sternal rub on the right side. Perrl. Eyes conjugate, no gaze deviation,+occulocephalic reflex, +cough on suction. no abnormal movements noted,   withdrawal to painful stimuli, on right and not in LUE and mildly in LLE  ASSESSMENT/PLAN  Carla Hoover is a 63 y.o. female with history of CHF, depression, elevated cholesterol, COPD, MI, and CAD on aspirin and plavix s/p fall where  she hit her head. Presenting to [facility] 11/23/2013 with left sided weakness, facial droop and headache. Intubated there and transferred to [facility]. Imaging confirms a right SAH, R IPH, with right MCA territory edema and hypodensity, concerning for right MCA stroke due to vasospasm. Developed partial seizure episodes, increased depakote and load with vimpat. Hyponatremia with likely CNS salt wasting, on 3% saline. Intubated due to airway protection. Na up to 126 today from 119 yesterday.  Hemorrhage: R insular IPH with R perisylvian SAH & R SDH likely from R MCA aneurysm not seen on angio, now with new neurological deficits likely related to vasospasm with vasogenic edema and mass effect  aspirin 81 mg orally every day and clopidogrel 75 mg orally every day prior to admission, held now secondary to IPH Vasospasm confirmed on angiogram and treated with verapamil IA.  Concerning for right MCA stroke due to vasospasm Repeat CT stable lovenox for VTE prophylaxis  NPO  Bedrest  Resultant Left hemiplegia I had a long telephone discussion with the patient's son on 12/03/13 about her prognosis and the likely need for tracheostomy, PEG tube placement to facilitate her nursing care but she is likely to have significant neurological disability and need 24 hour nursing care for a long period of time. I also discussed DO NOT RESUSCITATE status with the patient's son would like more time to think about this and would like to pursue aggressive medical care  for now  Respiratory failure - intubated on no sedation - vent per CCM  Partial seizure - left UE shaking episodes, none noted overnight - depakote increased to 750mg  tid - increase vimpat maintenance dose to 100mg  bid - d/c LTM EEG  Hyponatremia - likely salt wasting - 3% saline stopped by NSG.  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 Coronary artery disease - MI, angioplasty 2013 Ischemic cardiomyopathy  Other Pertinent History  AAA, infrarenal  R hepatic lobe lesion  This patient is critically ill and at significant risk of neurological worsening, death and care requires constant monitoring of vital signs, hemodynamics,respiratory and cardiac monitoring,review of multiple databases, neurological assessment, discussion with family, other specialists and medical decision making of high complexity.I have made any additions or clarifications directly to the above note.  I spent 30 minutes of neurocritical care time  in the care of  this patient.   Antony Contras, MD Stroke Neurology 12/04/2013 1:59 PM   To contact Stroke Continuity provider, please refer to [email or web address]  After hours, contact General Neurology

## 2013-12-04 NOTE — Progress Notes (Signed)
NUTRITION FOLLOW-UP  INTERVENTION:  Continue Osmolite 1.2 via NG tube @ 40 ml/hr.   30 ml Prostat TID  Tube feeding regimen provides 1452 kcal (99% of needs), 98 grams of protein, and 787 ml of H2O.    NUTRITION DIAGNOSIS: Inadequate oral intake related to inability to eat as evidenced by NPO status; ongoing.   Goal: Pt to meet >/= 90% of their estimated nutrition needs; met.    Monitor:  TF tolerance, weight trend, diet advancement  ASSESSMENT: Pt fell one day prior to admission, presented with left sided weakness and facial drop. Per CT pt has ICH with SAH.  Pt extubated 9/9 and failed swallow evaluation NG feedings started.  Pt re-intubated 9/13 with 6 mm shift.  Pt with hx of ETOH currently on thiamine and folic acid, also hx of COPD and tobacco abuse. Per notes hx of IBS.   Potassium low on replacement.   Patient is currently intubated on ventilator support MV: 12.2 L/min Temp (24hrs), Avg:98.7 F (37.1 C), Min:97 F (36.1 C), Max:99.6 F (37.6 C)  NGT: tip in stomach Pt had vomiting 9/12, now on Reglan.  Pt re-intubated 9/13 with 6 mm shift.  Family in discussions with MD to withdrawal care.    Height: Ht Readings from Last 1 Encounters:  11/14/2013 5' 4"  (1.626 m)    Weight: Wt Readings from Last 1 Encounters:  12/03/13 163 lb 12.8 oz (74.3 kg)  Admission weight 149 lb (67.8 kg) 9/10  BMI:  Body mass index is 28.1 kg/(m^2).  Estimated Nutritional Needs: Kcal: 1467 Protein: 80-95 grams Fluid: > 1.6 L/day  Skin: ecchymosis, MASD buttocks  Diet Order: NPO   Intake/Output Summary (Last 24 hours) at 12/04/13 1353 Last data filed at 12/04/13 1200  Gross per 24 hour  Intake 3202.5 ml  Output   1120 ml  Net 2082.5 ml    Last BM: 9/20  Labs:   Recent Labs Lab 11/30/13 0615 12/01/13 0700  12/02/13 0542 12/03/13 0550 12/04/13 0520  NA 140 139  < > 135* 134* 137  K 2.9* 2.6*  < > 3.2* 3.0* 3.3*  CL 105 105  < > 100 101 106  CO2 20 20  < >  20 18* 17*  BUN 27* 23  < > 23 25* 32*  CREATININE 0.66 0.65  < > 0.63 0.69 0.73  CALCIUM 8.6 8.4  < > 8.5 8.5 8.5  MG 1.3* 1.6  --  1.3*  --   --   PHOS 3.5 3.1  --  2.9  --   --   GLUCOSE 144* 155*  < > 153* 127* 92  < > = values in this interval not displayed.  CBG (last 3)   Recent Labs  12/04/13 0328 12/04/13 0732 12/04/13 1149  GLUCAP 121* 151* 105*    Scheduled Meds: . antiseptic oral rinse  7 mL Mouth Rinse QID  . aztreonam  1 g Intravenous 3 times per day  . chlorhexidine  15 mL Mouth Rinse BID  . enoxaparin (LOVENOX) injection  40 mg Subcutaneous Q24H  . feeding supplement (OSMOLITE 1.2 CAL)  1,000 mL Per Tube Q24H  . feeding supplement (PRO-STAT SUGAR FREE 64)  30 mL Per Tube TID  . fludrocortisone  0.1 mg Per Tube Daily  . folic acid  1 mg Per Tube Daily  . insulin aspart  0-9 Units Subcutaneous 6 times per day  . lacosamide (VIMPAT) IV  100 mg Intravenous Q12H  . NiMODipine  60  mg Oral Q4H  . pantoprazole sodium  40 mg Per Tube Q24H  . simvastatin  40 mg Per Tube q1800  . sodium chloride  10-40 mL Intracatheter Q12H  . thiamine  100 mg Per Tube Daily  . valproate sodium  750 mg Intravenous 3 times per day  . vancomycin  750 mg Intravenous Q12H    Continuous Infusions: . sodium chloride 10 mL/hr at 12/03/13 1644  . sodium chloride 75 mL/hr at 12/04/13 1200  . phenylephrine (NEO-SYNEPHRINE) Adult infusion Stopped (12/03/13 0932)     [place], [place], CNSC [phone] Pager [phone] After Hours Pager

## 2013-12-04 NOTE — Progress Notes (Signed)
Subjective: Patient reports (vent support)  Objective: Vital signs in last 24 hours: Temp:  [98.7 F (37.1 C)-99.6 F (37.6 C)] 98.7 F (37.1 C) (09/22 0329) Pulse Rate:  [72-131] 111 (09/22 0855) Resp:  [25-37] 35 (09/22 0855) BP: (102-139)/(47-71) 139/70 mmHg (09/22 0855) SpO2:  [100 %] 100 % (09/22 0855) FiO2 (%):  [30 %] 30 % (09/22 0855)  Intake/Output from previous day: 09/21 0701 - 09/22 0700 In: 3273.8 [I.V.:1811.3; NG/GT:735; IV Piggyback:727.5] Out: 1445 [Urine:1325; Stool:120] Intake/Output this shift: Total I/O In: 255 [I.V.:75; NG/GT:30; IV Piggyback:150] Out: -   No change neurologically, unresponsive. Vent support.  Lab Results:  Recent Labs  12/02/13 0542 12/03/13 0550  WBC 23.9* 21.2*  HGB 9.4* 9.0*  HCT 28.0* 26.0*  PLT 287 256   BMET  Recent Labs  12/03/13 0550 12/04/13 0520  NA 134* 137  K 3.0* 3.3*  CL 101 106  CO2 18* 17*  GLUCOSE 127* 92  BUN 25* 32*  CREATININE 0.69 0.73  CALCIUM 8.5 8.5    Studies/Results: Dg Chest Port 1 View  12/03/2013   CLINICAL DATA:  Pneumonitis  EXAM: PORTABLE CHEST - 1 VIEW  COMPARISON:  12/02/2013  FINDINGS: The endotracheal tube, feeding catheter and left jugular central line are again seen and stable. The endotracheal tube is again 3.7 cm above the carina. The lungs are well aerated bilaterally. Very minimal left basilar atelectasis is seen. The overall appearance has improved slightly in the interval from the prior exam with decreased effusion.  IMPRESSION: Mild persistent left basilar changes.  Tubes and lines as described.   Electronically Signed   By: Inez Catalina M.D.   On: 12/03/2013 07:43    Assessment/Plan:   LOS: 16 days  Continuing support. Family apparently contemplating tx vs withdrawal.    Carla Hoover, Aaron Edelman 12/04/2013, 10:09 AM

## 2013-12-04 NOTE — Progress Notes (Signed)
UR completed.  Shaily Librizzi, RN BSN [facility] CCM Trauma/Neuro ICU Case Manager [phone]  

## 2013-12-04 NOTE — Progress Notes (Signed)
PULMONARY / CRITICAL CARE MEDICINE   Name: Carla Hoover MRN: [id number] DOB: 1950/04/01    ADMISSION DATE:  11/24/2013 CONSULTATION DATE:  11/18/2013  REFERRING MD :  Erline Levine  CHIEF COMPLAINT:  Weakness  INITIAL PRESENTATION:  63 yo female fell and hit her head one day prior to admit.  Presented to [facility] with Lt sided weakness and facial drop.  CT head showed ICH with SAH.  She required intubation prior to transfer to [facility].  Reintubated 9/13 for vasospasm & worsening mental status. PCCM consulted for vent/medical management. Develop  STUDIES:  9/06 CT head ([place]) 9/07 Cerebral arteriogram >> 7 mm Lt MCA Aneurysm, vasospasm of Rt MCA 9/09 CT head >> no change in ICH, SAH, SDH 9/11 CT head >> unchanged ICH or MCA infarct changes 9/13 head CT- 6 mm shift, no change in ICH  SIGNIFICANT EVENTS: 9/06 Transfer from [place] to [facility] 9/11 More lethargic, Lt side weaker >> start [facility] therapy  9/13 ETT placed, hyponatemia  SUBJECTIVE:  No events overnight, unresponsive  VITAL SIGNS: Temp:  [98.7 F (37.1 C)-99.6 F (37.6 C)] 98.7 F (37.1 C) (09/22 0329) Pulse Rate:  [72-131] 85 (09/22 0700) Resp:  [25-37] 31 (09/22 0700) BP: (102-158)/(47-88) 124/52 mmHg (09/22 0700) SpO2:  [100 %] 100 % (09/22 0700) FiO2 (%):  [30 %] 30 % (09/22 0514) INTAKE / OUTPUT:  Intake/Output Summary (Last 24 hours) at 12/04/13 0732 Last data filed at 12/04/13 0700  Gross per 24 hour  Intake 3273.8 ml  Output   1445 ml  Net 1828.8 ml   PHYSICAL EXAMINATION:  Gen: acutely ill, intubated, RASS-5 HEENT NG tube PULM:clear BL CV: RRR, no mgr AB: BS+, soft, nontender Ext: warm no edema Neuro:moves RUE/LE to  pain, left hemiparesis,pupils dilated reactive BL, lt plantar upgoing,does not follow commands  LABS:  CBC  Recent Labs Lab 12/01/13 0700 12/02/13 0542 12/03/13 0550  WBC 20.7* 23.9* 21.2*  HGB 9.7* 9.4* 9.0*  HCT 29.0* 28.0* 26.0*  PLT 240 287 256   Coag's No  results found for this basename: APTT, INR,  in the last 168 hours BMET  Recent Labs Lab 12/02/13 0542 12/03/13 0550 12/04/13 0520  NA 135* 134* 137  K 3.2* 3.0* 3.3*  CL 100 101 106  CO2 20 18* 17*  BUN 23 25* 32*  CREATININE 0.63 0.69 0.73  GLUCOSE 153* 127* 92   Electrolytes  Recent Labs Lab 11/30/13 0615 12/01/13 0700  12/02/13 0542 12/03/13 0550 12/04/13 0520  CALCIUM 8.6 8.4  < > 8.5 8.5 8.5  MG 1.3* 1.6  --  1.3*  --   --   PHOS 3.5 3.1  --  2.9  --   --   < > = values in this interval not displayed.  ABG No results found for this basename: PHART, PCO2ART, PO2ART,  in the last 168 hours Liver Enzymes No results found for this basename: AST, ALT, ALKPHOS, BILITOT, ALBUMIN,  in the last 168 hours Imaging Dg Chest Port 1 View  12/03/2013   CLINICAL DATA:  Pneumonitis  EXAM: PORTABLE CHEST - 1 VIEW  COMPARISON:  12/02/2013  FINDINGS: The endotracheal tube, feeding catheter and left jugular central line are again seen and stable. The endotracheal tube is again 3.7 cm above the carina. The lungs are well aerated bilaterally. Very minimal left basilar atelectasis is seen. The overall appearance has improved slightly in the interval from the prior exam with decreased effusion.  IMPRESSION: Mild persistent left basilar  changes.  Tubes and lines as described.   Electronically Signed   By: Inez Catalina M.D.   On: 12/03/2013 07:43   ASSESSMENT / PLAN: NEUROLOGIC A:   ICH with SAH with concern for vasospasm/MCA stroke  Hx of anxiety, depression, insomnia  Hx of ETOH abuse Seizure likely, cEEG did not show P:   -Valproate for seizure prevention per neurology -Nimodipine x 21 days -Continue simvastatin -Continue HHH therapy >> goal SBP 160 to 180 with neo gtt- defer to neuro sx if we can stop this based on TCDs -CVP to goal 10,ct NS @ 75/h -Continue prozac -Thiamine, folic acid -Unfortunately mental status has not improved, communicated with NS and neuro, recommend  withdrawal at this point and I am in full agreement.  PULMONARY OETT 9/06 >> 9/09, 9/13 >> A: Acute respiratory failure in setting of ICH Bibasilar Atelectasis with mild hypoxemia Hx of COPD, tobacco abuse P:   - Oxygen to keep SpO2 > 92% - PRN albuterol - Hold PS trials at this point, recommend full comfort care and withdrawal.  CARDIOVASCULAR aline 9/11 >> CVL 9/11 >> A:  Hx of CAD, hyperlipidemia, HTN, chronic systolic heart failure, AAA. P:  -Hold ASA, plavix in setting of ICH until okay with neurology/neurosurgery -Hold lopressor, imdur, lasix while getting HHH therapy -Neo as needed for goal SBP 160-180 vs MAP 80-105 -Continue simvastatin (uses pravachol as outpt)   RENAL A:   Hyponatremia Decline in Na with associated seizures  R/o siadh (as limited progress and na drop on nacl), doubt CSW Hypokaleia/hypomag P:   Ct florinef NS @ 75/h to goal CVP 10 BMET daily Replete lytes as indicated  GASTROINTESTINAL A:   Protein calorie malnutrition Dysphagia Hx of IBS Diarrhea, c diff neg  P:   -Protonix for SUP -Panda tube for meds, TFs -Imodium ok  HEMATOLOGIC A:   Anemia of critical illness. P:  -F/U CBC  INFECTIOUS A:   Asp likely fever & rising WC ? Line sepsis 9/19 vanc >> 9/20 aztreonam >> P:   Sputum cx 9/13 >> ng Panculture - Dc levaquin, add aztreonam/ vanc  ENDOCRINE A:   Hyperglycemia   P:   - ssi  TODAY'S SUMMARY: Neuro speaking with family regarding plan of care, trach/peg not indicated and will not be offered at this point.  Son requested more time to consider plan of care.  Neuro is communicating with the family for now.  Care during the described time interval was provided by me and/or other providers on the critical care team.  I have reviewed this patient's available data, including medical history, events of note, physical examination and test results as part of my evaluation  CC time x 26m  Rush Farmer, M.D. [facility]  Pulmonary/Critical Care Medicine. Pager: [phone]. After hours pager: [phone].

## 2013-12-04 NOTE — Progress Notes (Signed)
Spoke with the son, Karma Greaser, this morning about coming in to speak with MD to make a plan.  He had lots of questions and seemed confused about her prognosis.  He said he would speak to his aunts and uncles for support and advice.  He said he can come in tomorrow, 9/23rd at 1800 to discuss with MD.

## 2013-12-05 ENCOUNTER — Inpatient Hospital Stay (HOSPITAL_COMMUNITY): Payer: Medicare Other

## 2013-12-05 DIAGNOSIS — Z515 Encounter for palliative care: Secondary | ICD-10-CM

## 2013-12-05 DIAGNOSIS — R403 Persistent vegetative state: Secondary | ICD-10-CM

## 2013-12-05 LAB — GLUCOSE, CAPILLARY
GLUCOSE-CAPILLARY: 137 mg/dL — AB (ref 70–99)
Glucose-Capillary: 103 mg/dL — ABNORMAL HIGH (ref 70–99)
Glucose-Capillary: 117 mg/dL — ABNORMAL HIGH (ref 70–99)
Glucose-Capillary: 127 mg/dL — ABNORMAL HIGH (ref 70–99)
Glucose-Capillary: 141 mg/dL — ABNORMAL HIGH (ref 70–99)
Glucose-Capillary: 158 mg/dL — ABNORMAL HIGH (ref 70–99)

## 2013-12-05 LAB — BASIC METABOLIC PANEL
ANION GAP: 16 — AB (ref 5–15)
Anion gap: 16 — ABNORMAL HIGH (ref 5–15)
BUN: 36 mg/dL — ABNORMAL HIGH (ref 6–23)
BUN: 34 mg/dL — ABNORMAL HIGH (ref 6–23)
CHLORIDE: 108 meq/L (ref 96–112)
CO2: 16 meq/L — AB (ref 19–32)
CO2: 15 meq/L — AB (ref 19–32)
CREATININE: 0.73 mg/dL (ref 0.50–1.10)
Calcium: 8.3 mg/dL — ABNORMAL LOW (ref 8.4–10.5)
Calcium: 8.4 mg/dL (ref 8.4–10.5)
Chloride: 108 mEq/L (ref 96–112)
Creatinine, Ser: 0.68 mg/dL (ref 0.50–1.10)
GFR calc Af Amer: >90 mL/min (ref >90)
GFR calc non Af Amer: 88 mL/min — ABNORMAL LOW (ref >90)
GFR calc non Af Amer: 90 mL/min — ABNORMAL LOW (ref >90)
GLUCOSE: 157 mg/dL — AB (ref 70–99)
Glucose, Bld: 180 mg/dL — ABNORMAL HIGH (ref 70–99)
POTASSIUM: 3.5 meq/L — AB (ref 3.7–5.3)
Potassium: 2.9 mEq/L — CL (ref 3.7–5.3)
SODIUM: 140 meq/L (ref 137–147)
Sodium: 139 mEq/L (ref 137–147)

## 2013-12-05 LAB — BLOOD GAS, ARTERIAL
Acid-base deficit: 8.7 mmol/L — ABNORMAL HIGH (ref 0.0–2.0)
Bicarbonate: 15.3 mEq/L — ABNORMAL LOW (ref 20.0–24.0)
DRAWN BY: 362771
FIO2: 0.3 %
MECHVT: 420 mL
O2 Saturation: 98.6 %
PCO2 ART: 25.9 mmHg — AB (ref 35.0–45.0)
PEEP/CPAP: 5 cmH2O
Patient temperature: 98.6
RATE: 12 resp/min
TCO2: 16.1 mmol/L (ref 0–100)
pH, Arterial: 7.388 (ref 7.350–7.450)
pO2, Arterial: 128 mmHg — ABNORMAL HIGH (ref 80.0–100.0)

## 2013-12-05 LAB — CBC
HCT: 23.2 % — ABNORMAL LOW (ref 36.0–46.0)
Hemoglobin: 7.9 g/dL — ABNORMAL LOW (ref 12.0–15.0)
MCH: 34.8 pg — ABNORMAL HIGH (ref 26.0–34.0)
MCHC: 34.1 g/dL (ref 30.0–36.0)
MCV: 102.2 fL — ABNORMAL HIGH (ref 78.0–100.0)
PLATELETS: 295 10*3/uL (ref 150–400)
RBC: 2.27 MIL/uL — AB (ref 3.87–5.11)
RDW: 14 % (ref 11.5–15.5)
WBC: 12.6 10*3/uL — AB (ref 4.0–10.5)

## 2013-12-05 LAB — MAGNESIUM: Magnesium: 1.6 mg/dL (ref 1.5–2.5)

## 2013-12-05 LAB — PHOSPHORUS: Phosphorus: 4.4 mg/dL (ref 2.3–4.6)

## 2013-12-05 MED ORDER — MAGNESIUM SULFATE 40 MG/ML IJ SOLN
2.0000 g | Freq: Once | INTRAMUSCULAR | Status: AC
  Administered 2013-12-05: 2 g via INTRAVENOUS
  Filled 2013-12-05: qty 50

## 2013-12-05 MED ORDER — POTASSIUM CHLORIDE 20 MEQ/15ML (10%) PO LIQD
40.0000 meq | ORAL | Status: AC
  Administered 2013-12-05 (×2): 40 meq
  Filled 2013-12-05 (×2): qty 30

## 2013-12-05 NOTE — Progress Notes (Addendum)
1500 Dr. Leonie Man met with son, Karma Greaser, and niece, Bridgett.  He reviewed the pt's status by showing the family her CT scans. Dwayne was unable to fully comprehend the severity of the situation and could not make a decision about the next step.  He did agree to change her code status to DNR and agreed to meet with the pallative team tomorrow.  Bridgett understands the situation but wants to support Dwayne's decision. I will continue to offer support until he is able to make a decision about his mother.

## 2013-12-05 NOTE — Progress Notes (Signed)
84 Niece called after she spoke with pt's son and they are coming to the unit at 1500 today to meet MD and discuss plan of care.  Niece understands the situation and wants to honor the wishes of the pt, but the son is having a hard time accepting the severity of the situation.

## 2013-12-05 NOTE — Progress Notes (Signed)
Subjective: Patient reports comatose, on vent  Objective: Vital signs in last 24 hours: Temp:  [97 F (36.1 C)-99.2 F (37.3 C)] 99.2 F (37.3 C) (09/23 0327) Pulse Rate:  [73-144] 82 (09/23 0600) Resp:  [23-35] 29 (09/23 0600) BP: (109-154)/(48-86) 118/48 mmHg (09/23 0600) SpO2:  [97 %-100 %] 100 % (09/23 0600) FiO2 (%):  [30 %] 30 % (09/23 0428) Weight:  [74.5 kg (164 lb 3.9 oz)] 74.5 kg (164 lb 3.9 oz) (09/23 0500)  Intake/Output from previous day: 09/22 0701 - 09/23 0700 In: 3058 [I.V.:1425; NG/GT:1058; IV Piggyback:575] Out: 7416 [Urine:1625] Intake/Output this shift:    Physical Exam: Moves right side to noxious stimulus, not moving left side.  Not following commands.  Lab Results:  Recent Labs  12/03/13 0550 12/05/13 0500  WBC 21.2* 12.6*  HGB 9.0* 7.9*  HCT 26.0* 23.2*  PLT 256 295   BMET  Recent Labs  12/04/13 0520 12/05/13 0500  NA 137 140  K 3.3* 2.9*  CL 106 108  CO2 17* 16*  GLUCOSE 92 180*  BUN 32* 36*  CREATININE 0.73 0.73  CALCIUM 8.5 8.3*    Studies/Results: No results found.  Assessment/Plan: Patient is not doing well and is unlikely to have any acceptable form of meaningful recovery.  Patient's son is reluctant to withdraw care, but I am strongly opposed to proceeding with Trach and PEG.  This does not seem to be consistent with the patient's previously expressed wishes.  Son needs to make a decision that reflects the patient's wishes or allow one of her family members to make care decisions on her behalf.  Continuing as we are doing is not, I believe, in the patient's best interests.    LOS: 17 days    Peggyann Shoals, MD 12/05/2013, 7:33 AM

## 2013-12-05 NOTE — Progress Notes (Signed)
[facility] ADULT ICU REPLACEMENT PROTOCOL FOR AM LAB REPLACEMENT ONLY  The patient does apply for the [facility] Adult ICU Electrolyte Replacment Protocol based on the criteria listed below:   1. Is GFR >/= 40 ml/min? Yes.    Patient's GFR today is >90 2. Is urine output >/= 0.5 ml/kg/hr for the last 6 hours? Yes.   Patient's UOP is .89 ml/kg/hr 3. Is BUN < 60 mg/dL? Yes.    Patient's BUN today is 36 4. Abnormal electrolyte  K 2.9  Mg 1.6 5. Ordered repletion with: per protocol 6. If a panic level lab has been reported, has the CCM MD in charge been notified? Yes.  .   Physician:  Dr Zenovia Jarred 12/05/2013 6:52 AM

## 2013-12-05 NOTE — Progress Notes (Signed)
Called the son to see if he spoke with his aunts & uncles about decision for plan of care.  He did not answer but I left a voicemail for him to call me as soon as he could.  Dr. Leonie Man also attempted to call him but did not leave a message.  I will continue to try to contact him.

## 2013-12-05 NOTE — Progress Notes (Signed)
PULMONARY / CRITICAL CARE MEDICINE   Name: Carla Hoover MRN: [id number] DOB: September 29, 1950    ADMISSION DATE:  11/14/2013 CONSULTATION DATE:  11/18/2013  REFERRING MD :  Erline Levine  CHIEF COMPLAINT:  Weakness  INITIAL PRESENTATION:  63 yo female fell and hit her head one day prior to admit.  Presented to [facility] with Lt sided weakness and facial drop.  CT head showed ICH with SAH.  She required intubation prior to transfer to [facility].  Reintubated 9/13 for vasospasm & worsening mental status. PCCM consulted for vent/medical management. Develop  STUDIES:  9/06 CT head ([place]) 9/07 Cerebral arteriogram >> 7 mm Lt MCA Aneurysm, vasospasm of Rt MCA 9/09 CT head >> no change in ICH, SAH, SDH 9/11 CT head >> unchanged ICH or MCA infarct changes 9/13 head CT- 6 mm shift, no change in ICH  SIGNIFICANT EVENTS: 9/06 Transfer from [place] to [facility] 9/11 More lethargic, Lt side weaker >> start [facility] therapy  9/13 ETT placed, hyponatemia  SUBJECTIVE:  No events overnight, unresponsive  VITAL SIGNS: Temp:  [97 F (36.1 C)-99.2 F (37.3 C)] 98.5 F (36.9 C) (09/23 0732) Pulse Rate:  [65-144] 88 (09/23 0900) Resp:  [23-35] 35 (09/23 0900) BP: (109-154)/(46-86) 121/50 mmHg (09/23 0900) SpO2:  [97 %-100 %] 100 % (09/23 0900) FiO2 (%):  [30 %] 30 % (09/23 0855) Weight:  [74.5 kg (164 lb 3.9 oz)] 74.5 kg (164 lb 3.9 oz) (09/23 0500)  INTAKE / OUTPUT:  Intake/Output Summary (Last 24 hours) at 12/05/13 1122 Last data filed at 12/05/13 0912  Gross per 24 hour  Intake 2985.5 ml  Output   1625 ml  Net 1360.5 ml   PHYSICAL EXAMINATION:  Gen: acutely ill, intubated, RASS-5 HEENT NG tube PULM:clear BL CV: RRR, no mgr AB: BS+, soft, nontender Ext: warm no edema Neuro:moves RUE/LE to  pain, left hemiparesis,pupils dilated reactive BL, lt plantar upgoing,does not follow commands  LABS:  CBC  Recent Labs Lab 12/02/13 0542 12/03/13 0550 12/05/13 0500  WBC 23.9* 21.2* 12.6*   HGB 9.4* 9.0* 7.9*  HCT 28.0* 26.0* 23.2*  PLT 287 256 295   Coag's No results found for this basename: APTT, INR,  in the last 168 hours BMET  Recent Labs Lab 12/03/13 0550 12/04/13 0520 12/05/13 0500  NA 134* 137 140  K 3.0* 3.3* 2.9*  CL 101 106 108  CO2 18* 17* 16*  BUN 25* 32* 36*  CREATININE 0.69 0.73 0.73  GLUCOSE 127* 92 180*   Electrolytes  Recent Labs Lab 12/01/13 0700  12/02/13 0542 12/03/13 0550 12/04/13 0520 12/05/13 0500  CALCIUM 8.4  < > 8.5 8.5 8.5 8.3*  MG 1.6  --  1.3*  --   --  1.6  PHOS 3.1  --  2.9  --   --  4.4  < > = values in this interval not displayed.  ABG  Recent Labs Lab 12/05/13 0350  PHART 7.388  PCO2ART 25.9*  PO2ART 128.0*   Liver Enzymes No results found for this basename: AST, ALT, ALKPHOS, BILITOT, ALBUMIN,  in the last 168 hours Imaging No results found. ASSESSMENT / PLAN: NEUROLOGIC A:   ICH with SAH with concern for vasospasm/MCA stroke  Hx of anxiety, depression, insomnia  Hx of ETOH abuse Seizure likely, cEEG did not show P:   -Valproate for seizure prevention per neurology -Nimodipine x 21 days -Continue simvastatin -Continue HHH therapy >> goal SBP 160 to 180 with neo gtt- defer to neuro sx  if we can stop this based on TCDs -CVP to goal 10,ct NS @ 75/h -Continue prozac -Thiamine, folic acid -Unfortunately mental status has not improved, communicated with NS and neuro, recommend withdrawal at this point and I am in full agreement.  PULMONARY OETT 9/06 >> 9/09, 9/13 >> A: Acute respiratory failure in setting of ICH Bibasilar Atelectasis with mild hypoxemia Hx of COPD, tobacco abuse P:   - Oxygen to keep SpO2 > 92% - PRN albuterol - Hold PS trials at this point, recommend full comfort care and withdrawal.  CARDIOVASCULAR aline 9/11 >> CVL 9/11 >> A:  Hx of CAD, hyperlipidemia, HTN, chronic systolic heart failure, AAA. P:  -Hold ASA, plavix in setting of ICH until okay with  neurology/neurosurgery -Hold lopressor, imdur, lasix while getting HHH therapy -Neo as needed for goal SBP 160-180 vs MAP 80-105 -Continue simvastatin (uses pravachol as outpt)   RENAL A:   Hyponatremia Decline in Na with associated seizures  R/o siadh (as limited progress and na drop on nacl), doubt CSW Hypokaleia/hypomag P:   Ct florinef NS @ 75/h BMET daily Replete lytes as indicated  GASTROINTESTINAL A:   Protein calorie malnutrition Dysphagia Hx of IBS Diarrhea, c diff neg  P:   -Protonix for SUP -Panda tube for meds, TFs -Imodium ok  HEMATOLOGIC A:   Anemia of critical illness. P:  -F/U CBC  INFECTIOUS A:   Asp likely fever & rising WC ? Line sepsis 9/19 vanc >> 9/20 aztreonam >> P:   Sputum cx 9/13 >> ng Panculture - Dc levaquin, add aztreonam/ vanc  ENDOCRINE A:   Hyperglycemia   P:   - ssi  TODAY'S SUMMARY: Called son today for an update and to determine plan of care, no response.  I fully agree and support Dr. Leonie Man and Dr. Vertell Limber that trach/peg is inappropriate here.  Will consult palliative care and ethics at this point since family is unapproachable and not willing to decide on plan of care.  Care during the described time interval was provided by me and/or other providers on the critical care team.  I have reviewed this patient's available data, including medical history, events of note, physical examination and test results as part of my evaluation  CC time x 11m  Rush Farmer, M.D. [facility] Pulmonary/Critical Care Medicine. Pager: [phone]. After hours pager: [phone].

## 2013-12-05 NOTE — Progress Notes (Signed)
CRITICAL VALUE ALERT  Critical value received:  K 2.9  Date of notification:  12/05/13  Time of notification:  0646  Critical value read back:Yes.    Nurse who received alert:  Tawni Carnes, RN  MD notified (1st page):  [place]  Time of first page:  [phone]  MD notified (2nd page):  Time of second page:  Responding MD:  Lamonte Sakai  Time MD responded:  [phone]

## 2013-12-05 NOTE — Progress Notes (Signed)
STROKE TEAM PROGRESS NOTE   HISTORY  63 y.o. 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 She has surrounding cytotoxic edema and midline shift and is employed left MCA aneurysm as well.    SUBJECTIVE (INTERVAL HISTORY)  Remains unresponsive.  Patient's family is willing to meet today at 3 pm. Discussed with Drs Vertell Limber and Nelda Marseille and will make patient DNR OBJECTIVE  Filed Vitals:   12/05/13 1000 12/05/13 1100 12/05/13 1200 12/05/13 1216  BP: 132/60 136/54 133/62 133/62  Pulse: 80 84 93 104  Temp:   97.1 F (36.2 C)   TempSrc:   Axillary   Resp: 34 33 33 34  Height:      Weight:      SpO2: 100% 100% 100% 100%    Recent Labs  Lab  11/28/2013 1900  11/22/2013 0245  11/20/13 0550  11/21/13 0500   NA  132*  128*  131*  131*   K  3.6*  3.7  3.2*  2.7*   CL  93*  92*  96  95*   CO2  23  20  22  22    GLUCOSE  142*  148*  109*  124*   BUN  10  11  10  9    CREATININE  0.99  0.98  0.82  0.72   CALCIUM  8.5  8.0*  7.2*  7.0*   MG  1.0*  --  --  --     Recent Labs  Lab  11/13/2013 1900  11/20/13 0550   AST  74*  49*   ALT  37*  24   ALKPHOS  122*  86   BILITOT  1.2  0.9   PROT  6.8  5.8*   ALBUMIN  3.3*  2.6*     Recent Labs  Lab  12/05/2013 2345  11/26/2013 0245  11/20/13 0550  11/21/13 0500   WBC  --  7.1  8.0  7.8   NEUTROABS  --  --  6.2  --   HGB  --  11.1*  9.6*  9.9*   HCT  --  32.0*  28.0*  28.6*   MCV  --  100.3*  99.6  99.3   PLT  205  186  248  215     Recent Labs   11/16/2013 1900   LABPROT  12.5   INR  0.93      Component  Value  Date/Time    CHOL  118  05/18/2012 0512    TRIG  90  11/30/2013 1900    HDL  66  05/18/2012 0512    CHOLHDL  1.8  05/18/2012 0512    VLDL  14  05/18/2012 0512    LDLCALC  38  05/18/2012 0512    Ct Angio Head W/cm &/or Wo Cm  12/07/2013 1. Unchanged  appearance of extensive, predominantly right-sided subarachnoid hemorrhage as well as 2.7 cm hematoma in the region of the right insula. Moderate surrounding edema with 5 mm of unchanged midline shift. 2. No right MCA aneurysm is identified, however there is severe attenuation of the right MCA and its branch vessels which may reflect local mass effect and vasospasm secondary to surrounding hemorrhage, and this could obscure an underlying right MCA aneurysm. Narrowing of the ACAs and right ICA terminus also may reflect vasospasm. Catheter  angiography may be helpful for further evaluation. 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  Dg Chest 1 View  11/21/2013 Improved aeration of the left lung base with mild basilar atelectasis remaining  11/20/2013 Slight increase in left basilar opacity most consistent with atelectasis.  11/18/2013 No significant change in mild opacity in the lateral left lung base.  11/13/2013 Endotracheal tube and nasogastric tube in appropriate position. Stable mild opacity in lateral left lung base.  Ct Head Wo Contrast  11/25/2013 Hemorrhagic right MCA infarct is stable. 6 mm midline shift unchanged. No new findings. 11/23/2013 1. Unchanged intracranial hemorrhage as above. 2. Unchanged edema in the right temporal, frontal, and parietal lobes, consistent with right MCA infarct. Similar degree of leftward midline shift. 11/21/2013 1. No significant interval change in intraparenchymal, subarachnoid, and subdural hemorrhage as compared to 11/20/13. Associated vasogenic edema is similar with stable 4-5 mm of right-to-left midline shift. No hydrocephalus. 2. No new hemorrhage.  11/20/2013 1. No change in the intracranial hemorrhage since the most recent prior study. Right frontal parenchymal hematoma is stable in size as is the degree of mass effect. Subarachnoid hemorrhage and areas of subdural hemorrhage are stable. No new intracranial hemorrhage. 2. Mass effect and  midline shift ir stable. 3. No hydrocephalus.  11/28/2013 Similar extensive right greater than left subarachnoid blood, with a trace possible posterior falcine subdural hematoma. Evolving 2.7 x 2.4 cm right insular hematoma with similar 5 mm right to left midline shift. No hydrocephalus.  TCD  12/03/2013 Limited study due to poor windows and neck strap limiting evaluation. Low normal mean flow velocities in identified vessels of anterior circulation. Globally elevated pulsatility indices suggest increase intracranial presure or diffuse intracranial atherosclerosis.  EEG LTM 11/27/13 Clinical interpretation: This 24 hours of intensive EEG monitoring with simultaneous video monitoring did not record any clinical or subclinical seizures. Background activities were marked by background activities slowing non- reactive to external stimuli, suggestive of a severe encephalopathy of nonspecific etiologies including toxic metabolic pharmacologic multifocal degenerative etiologies. Right hemisphere background slowing was more prominent and attenuated , lacking runs of faster frequencies. These Findings are suggestive of an additional neuronal dysfunction across the right hemisphere. There is no epileptiform discharges clinical or subclinical seizures present.  Clinical correlation is advised.  PHYSICAL EXAM  Filed Vitals:   12/05/13 1000 12/05/13 1100 12/05/13 1200 12/05/13 1216  BP: 132/60 136/54 133/62 133/62  Pulse: 80 84 93 104  Temp:   97.1 F (36.2 C)   TempSrc:   Axillary   Resp: 34 33 33 34  Height:      Weight:      SpO2: 100% 100% 100% 100%   Physical Exam ;  [name] intubated. Afebrile. Head is nontraumatic. Neck is supple without bruit. Cardiac exam no murmur or gallop. Lungs are coarse rhonchi bilaterally. Distal pulses are well felt.   Neurologic Examination:  Intubated and not on sedation. Unresponsive to voice, not following commands. Minimal response to sternal rub on the  right side. Perrl. Eyes conjugate, no gaze deviation,+oculocephalic reflex, +cough on suction. no abnormal movements noted,   withdrawal to painful stimuli, on right and not in LUE and mildly in LLE  ASSESSMENT/PLAN  Ms. KENYANA HUSAK is a 63 y.o. female with history of CHF, depression, elevated cholesterol, COPD, MI, and CAD on aspirin and plavix s/p fall where she hit her head. Presenting to [facility] 11/28/2013 with left sided weakness, facial droop and headache. Intubated  there and transferred to [facility]. Imaging confirms a right SAH, R IPH, with right MCA territory edema and hypodensity, concerning for right MCA stroke due to vasospasm. Developed partial seizure episodes, increased depakote and load with vimpat. Hyponatremia with likely CNS salt wasting, on 3% saline. Intubated due to airway protection. Na up to 126 today from 119 yesterday.  Hemorrhage: R insular IPH with R perisylvian SAH & R SDH likely from R MCA aneurysm not seen on angio, now with new neurological deficits likely related to vasospasm with vasogenic edema and mass effect  aspirin 81 mg orally every day and clopidogrel 75 mg orally every day prior to admission, held now secondary to IPH Vasospasm confirmed on angiogram and treated with verapamil IA.  Concerning for right MCA stroke due to vasospasm Repeat CT stable lovenox for VTE prophylaxis  NPO  Bedrest  Resultant Left hemiplegia I had a long telephone discussion with the patient's son on 12/03/13 about her prognosis and the likely need for tracheostomy, PEG tube placement to facilitate her nursing care but she is likely to have significant neurological disability and need 24 hour nursing care for a long period of time. I also discussed DO NOT RESUSCITATE status with the patient's son would like more time to think about this and would like to pursue aggressive medical care for now Plan to meet with family today at 3 pm Will place palliative care consult  Respiratory  failure - intubated on no sedation - vent per CCM  Partial seizure - left UE shaking episodes, none noted overnight - depakote increased to 750mg  tid - increase vimpat maintenance dose to 100mg  bid - d/c LTM EEG  Hyponatremia - likely salt wasting - 3% saline stopped by NSG.  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 Coronary artery disease - MI, angioplasty 2013 Ischemic cardiomyopathy  Other Pertinent History  AAA, infrarenal  R hepatic lobe lesion  This patient is critically ill and at significant risk of neurological worsening, death and care requires constant monitoring of vital signs, hemodynamics,respiratory and cardiac monitoring,review of multiple databases, neurological assessment, discussion with family, other specialists and medical decision making of high complexity.I have made any additions or clarifications directly to the above note.  I spent 50 minutes of neurocritical care time  in the care of  this patient.   Antony Contras, MD Stroke Neurology 12/05/2013 12:58 PM   To contact Stroke Continuity provider, please refer to [email or web address]  After hours, contact General Neurology

## 2013-12-05 NOTE — Progress Notes (Signed)
No wean per MD

## 2013-12-05 NOTE — Consult Note (Signed)
Patient Carla Hoover JADINE BRUMLEY      DOB: 06-16-1950      VWU:[id number]   Consult Note from the Palliative Medicine Team at [place] Requested by: Dr Leonie Man     PCP: Glenda Chroman., MD Reason for Consultation:Clarification of [place]     Phone Number:[phone]  Assessment of patients Current state:   Poor response to medical interventions s/p fall/ICH/SAH/intubation. (admitted 11/15/2013)  Remains unresponsive/intubated and unable to wean from ventilator since admission on 12/11/2013, overall grim prognosis.  Family is faced with advanced directive decisions and anticipatory care needs.    Consult is for review of medical treatment options, clarification of goals of care and end of life issues, disposition and options, and symptom recommendation and emotional support.  This NP Wadie Lessen reviewed medical records, received report from team, assessed the patient and then meet at the patient's bedside along with her son Karma Greaser, and  niece Edwin Dada (she is Dwayne's main support person)  to discuss diagnosis,  prognosis, GOC, EOL wishes disposition and options.  A detailed discussion was had today regarding present  medical situation, diagnosis, prognosis, limitations of medical interventions, and importance of and utilization of advanced directives.    The difference between a aggressive medical intervention path  and a palliative comfort care path for this patient at this time was had.  Values and goals of care important to patient and family were attempted to be elicited.  Concept of Palliative Care was discussed and its role in patient centered care.  Natural trajectory and expectations at EOL were discussed. Likely outcomes for this patient after liberation from ventilator were discussed.  Questions and concerns addressed.   Family encouraged to call with questions or concerns.  PMT will continue to support holistically.   Goals of Care: 1.  Code Status:  DNR/DNI   2. Scope of  Treatment:  Continue present medical treatment plan.  Patient's son Karma Greaser struggles with the responsibility of making decisions related to de-escaltion of care for his mother and the shifting to a full comfort care approach.  He is contemplating foregoing decision making to other family members.    Other family members have expressed that they believe that the patient herself would never want to continue escalating care (trach/PEG) and in fact has verbalized in the past her desire to "never be on a breathing machine.   Dwayne expresses interest in speaking with Dr Vertell Limber in the morning.  He tells me he will call me tomorrow at noon to solidify decisions regarding his mother's care.   3. Symptom Management:   1.  Altered mental status/Failure to thrive 2. Anticipate need to address dyspnea/pain/terminal secretions post extubation   5. Psychosocial:  Emotional support offered to [facility] and [place].  This a a very difficult situation, complicated by emotion, addiction and grief.   6. Spiritual:  Dwayne  struggles with the spiritual question of his mother compromised her salvation because of her recent behavior related to alcohol abuse.  Family speak to her "having been saved" many years ago.  Daryll plans to speak to a minister (in [place]) tonight to help answer his questions.  I have offered chaplain support and it was declined.     Brief HPI:    Carla Hoover is a 63 y.o. with history of hypertension, ischemic cardiomyopathy and tobacco/ alcohol abuse.. Admitted 12/10/2013 after a fall striking her head. She was brought to [facility] for left-sided weakness and facial droop. Head CT  at that time showed right sylvian SAH and blood in sylvian fissure and layering in region of right MCA. She  was intubated and transferred to [facility] for further care. CT angiogram head showed unchanged appearance of extensive, predominantly right sided subarachnoid hemorrhage as well as 2.  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 Underwent cerebral arteriogram 11/17/2013 per interventional radiology with no definite aneurysm identified. Continues to be unresponsive, unable to wean.    ROS: unable to ilict due to altered mental status   PMH:  Past Medical History  Diagnosis Date  . Hypertension     a. Hypotension noted 05/2012.  Marland Kitchen Anxiety   . Depression   . Arthritis   . Irritable bowel syndrome (IBS)   . Diverticulitis   . Insomnia   . Eczema   . Gout   . GI bleed     Possible mild GI bleed 04/2011, hemoccult negative at [facility] and no colonoscopy/EGD felt necessary by GI at that time  . Ischemic cardiomyopathy     IMPROVED. a. EF 40-45% by echo at [facility] 04/2011. b. Improved to 55% by echo 05/2012.  Marland Kitchen CAD (coronary artery disease)     a. s/p two overlapping BMS to LAD 05/12/11, Occluded mid LCX with collaterals. b. NSTEMI 05/2012: PTCA/DES to mid Cx and PTCA/DES to prox-mid LAD 05/18/12.  Marland Kitchen Hyperlipidemia   . AAA (abdominal aortic aneurysm) 05/19/2012    a. Infrarenal AAA by CT at [facility] 3.4cm 05/2012.  . Tobacco abuse   . Hepatic lesion     a. Right hepatic lobe density of uncertain significance by CT at [facility] 05/2012 - needs f/u PCP.  Marland Kitchen Acute renal insufficiency     05/2012 - Cr 1.7. Improved.  . Tobacco use disorder 05/25/2012  . GERD (gastroesophageal reflux disease) 01/18/2013  . Seizures 11/25/2013     PSH: Past Surgical History  Procedure Laterality Date  . Abdominal hysterectomy    . Cardiac catheterization    . Coronary angioplasty  05/2011    2 BMS to mid LAD  . Colonoscopy      about 10 years, Dr. Hinton Lovely  . Colonoscopy N/A 02/15/2013    Procedure: COLONOSCOPY;  Surgeon: Daneil Dolin, MD;  [facility];  Service: Endoscopy;  Laterality: N/A;  2:15-moved to [place] notified pt  . Esophagogastroduodenoscopy (egd) with esophageal dilation N/A 02/15/2013    Procedure: ESOPHAGOGASTRODUODENOSCOPY (EGD) WITH ESOPHAGEAL DILATION;  Surgeon: Daneil Dolin, MD;  Location: [facility];  Service: Endoscopy;  Laterality: N/A;  . Radiology with anesthesia N/A 11/14/2013    Procedure: RADIOLOGY WITH ANESTHESIA;  Surgeon: Rob Hickman, MD;  Location: [place];  Service: Radiology;  Laterality: N/A;   I have reviewed the [place] and SH and  If appropriate update it with new information. Allergies  Allergen Reactions  . Penicillins Swelling and Other (See Comments)    Per patient report, she has facial and tongue swellling  . Percocet [Oxycodone-Acetaminophen] Other (See Comments)    Hallucinations  . Percodan [Oxycodone-Aspirin] Other (See Comments)    Hallucinations   Scheduled Meds: . antiseptic oral rinse  7 mL Mouth Rinse QID  . aztreonam  1 g Intravenous 3 times per day  . chlorhexidine  15 mL Mouth Rinse BID  . enoxaparin (LOVENOX) injection  40 mg Subcutaneous  Q24H  . feeding supplement (OSMOLITE 1.2 CAL)  1,000 mL Per Tube Q24H  . feeding supplement (PRO-STAT SUGAR FREE 64)  30 mL Per Tube TID  . fludrocortisone  0.1 mg Per Tube Daily  . folic acid  1 mg Per Tube Daily  . insulin aspart  0-9 Units Subcutaneous 6 times per day  . lacosamide (VIMPAT) IV  100 mg Intravenous Q12H  . NiMODipine  60 mg Oral Q4H  . pantoprazole sodium  40 mg Per Tube Q24H  . simvastatin  40 mg Per Tube q1800  . sodium chloride  10-40 mL Intracatheter Q12H  . thiamine  100 mg Per Tube Daily  . valproate sodium  750 mg Intravenous 3 times per day  . vancomycin  750 mg Intravenous Q12H   Continuous Infusions: . sodium chloride 10 mL/hr at 12/03/13 1644  . sodium chloride 75 mL/hr at 12/05/13 1700  . phenylephrine (NEO-SYNEPHRINE) Adult infusion Stopped (12/03/13 0932)   PRN Meds:.acetaminophen (TYLENOL) oral liquid 160 mg/5 mL, albuterol, fentaNYL, labetalol,  loperamide, LORazepam, ondansetron (ZOFRAN) IV, sodium chloride    BP 151/55  Pulse 94  Temp(Src) 97.1 F (36.2 C) (Axillary)  Resp 32  Ht 5\' 4"  (1.626 m)  Wt 74.5 kg (164 lb 3.9 oz)  BMI 28.18 kg/m2  SpO2 100%   PPS: 20%   Intake/Output Summary (Last 24 hours) at 12/05/13 1814 Last data filed at 12/05/13 1700  Gross per 24 hour  Intake   3430 ml  Output   1850 ml  Net   1580 ml     Physical Exam:  General: intubated, no sedation, unresponsive HEENT:  ET tube noted, moist buccal membranes Chest:  Bilateral equal   CVS: RRR Abdomen: soft NT +BS Ext: without edema Neuro: no sedation, unresponsive, unable to follow commands  Labs: CBC    Component Value Date/Time   WBC 12.6* 12/05/2013 0500   RBC 2.27* 12/05/2013 0500   HGB 7.9* 12/05/2013 0500   HCT 23.2* 12/05/2013 0500   PLT 295 12/05/2013 0500   MCV 102.2* 12/05/2013 0500   MCH 34.8* 12/05/2013 0500   MCHC 34.1 12/05/2013 0500   RDW 14.0 12/05/2013 0500   LYMPHSABS 1.1 11/26/2013 0435   MONOABS 2.0* 11/26/2013 0435   EOSABS 0.0 11/26/2013 0435   BASOSABS 0.0 11/26/2013 0435    BMET    Component Value Date/Time   NA 140 12/05/2013 0500   K 2.9* 12/05/2013 0500   CL 108 12/05/2013 0500   CO2 16* 12/05/2013 0500   GLUCOSE 180* 12/05/2013 0500   BUN 36* 12/05/2013 0500   CREATININE 0.73 12/05/2013 0500   CREATININE 1.11* 02/05/2013 1139   CALCIUM 8.3* 12/05/2013 0500   GFRNONAA 88* 12/05/2013 0500   GFRAA >90 12/05/2013 0500    CMP     Component Value Date/Time   NA 140 12/05/2013 0500   K 2.9* 12/05/2013 0500   CL 108 12/05/2013 0500   CO2 16* 12/05/2013 0500   GLUCOSE 180* 12/05/2013 0500   BUN 36* 12/05/2013 0500   CREATININE 0.73 12/05/2013 0500   CREATININE 1.11* 02/05/2013 1139   CALCIUM 8.3* 12/05/2013 0500   PROT 5.6* 11/26/2013 0435   ALBUMIN 2.2* 11/26/2013 0435   AST 86* 11/26/2013 0435   ALT 48* 11/26/2013 0435   ALKPHOS 88 11/26/2013 0435   BILITOT 0.3 11/26/2013 0435   GFRNONAA 88* 12/05/2013 0500    GFRAA >90 12/05/2013 0500    Time In Time Out Total Time Spent with Patient Total  Overall Time  1645 1830 90 min 105 min    Greater than 50%  of this time was spent counseling and coordinating care related to the above assessment and plan.   Wadie Lessen NP  Palliative Medicine Team Team Phone # [phone] Pager [phone]  Discussed with Susannah/Jordon RNs

## 2013-12-05 NOTE — Progress Notes (Signed)
Pharmacy Consult - Vancomycin and aztreonam  Continues for rule out sepsis Cultures negative Scr stable  Plan: Continue antibiotics at current doses Await family decision  Thank you. Anette Guarneri, PharmD [phone]

## 2013-12-06 ENCOUNTER — Inpatient Hospital Stay (HOSPITAL_COMMUNITY): Payer: Medicare Other

## 2013-12-06 DIAGNOSIS — Z515 Encounter for palliative care: Secondary | ICD-10-CM

## 2013-12-06 DIAGNOSIS — R4182 Altered mental status, unspecified: Secondary | ICD-10-CM

## 2013-12-06 LAB — BASIC METABOLIC PANEL
Anion gap: 15 (ref 5–15)
BUN: 35 mg/dL — ABNORMAL HIGH (ref 6–23)
CALCIUM: 8.6 mg/dL (ref 8.4–10.5)
CHLORIDE: 110 meq/L (ref 96–112)
CO2: 16 mEq/L — ABNORMAL LOW (ref 19–32)
CREATININE: 0.67 mg/dL (ref 0.50–1.10)
GFR calc non Af Amer: >90 mL/min (ref >90)
Glucose, Bld: 127 mg/dL — ABNORMAL HIGH (ref 70–99)
Potassium: 3.4 mEq/L — ABNORMAL LOW (ref 3.7–5.3)
Sodium: 141 mEq/L (ref 137–147)

## 2013-12-06 LAB — GLUCOSE, CAPILLARY
GLUCOSE-CAPILLARY: 92 mg/dL (ref 70–99)
Glucose-Capillary: 130 mg/dL — ABNORMAL HIGH (ref 70–99)
Glucose-Capillary: 134 mg/dL — ABNORMAL HIGH (ref 70–99)
Glucose-Capillary: 146 mg/dL — ABNORMAL HIGH (ref 70–99)
Glucose-Capillary: 114 mg/dL — ABNORMAL HIGH (ref 70–99)

## 2013-12-06 LAB — BLOOD GAS, ARTERIAL
Acid-base deficit: 8.5 mmol/L — ABNORMAL HIGH (ref 0.0–2.0)
Bicarbonate: 15.6 mEq/L — ABNORMAL LOW (ref 20.0–24.0)
DRAWN BY: 40415
FIO2: 0.3 %
O2 Saturation: 99.1 %
PATIENT TEMPERATURE: 100
PEEP: 5 cmH2O
PH ART: 7.365 (ref 7.350–7.450)
RATE: 12 resp/min
TCO2: 16.5 mmol/L (ref 0–100)
VT: 420 mL
pCO2 arterial: 28.4 mmHg — ABNORMAL LOW (ref 35.0–45.0)
pO2, Arterial: 125 mmHg — ABNORMAL HIGH (ref 80.0–100.0)

## 2013-12-06 LAB — CBC
HCT: 24 % — ABNORMAL LOW (ref 36.0–46.0)
Hemoglobin: 8.2 g/dL — ABNORMAL LOW (ref 12.0–15.0)
MCH: 34.9 pg — ABNORMAL HIGH (ref 26.0–34.0)
MCHC: 34.2 g/dL (ref 30.0–36.0)
MCV: 102.1 fL — AB (ref 78.0–100.0)
Platelets: 409 10*3/uL — ABNORMAL HIGH (ref 150–400)
RBC: 2.35 MIL/uL — AB (ref 3.87–5.11)
RDW: 14.1 % (ref 11.5–15.5)
WBC: 14.5 10*3/uL — ABNORMAL HIGH (ref 4.0–10.5)

## 2013-12-06 LAB — MAGNESIUM: Magnesium: 1.8 mg/dL (ref 1.5–2.5)

## 2013-12-06 LAB — PHOSPHORUS: Phosphorus: 4.2 mg/dL (ref 2.3–4.6)

## 2013-12-06 MED ORDER — POTASSIUM CHLORIDE 20 MEQ/15ML (10%) PO LIQD
40.0000 meq | Freq: Three times a day (TID) | ORAL | Status: AC
  Administered 2013-12-06 (×2): 40 meq
  Filled 2013-12-06 (×4): qty 30

## 2013-12-06 NOTE — Progress Notes (Signed)
Subjective: Patient reports unresponsive on the ventilator  Objective: Vital signs in last 24 hours: Temp:  [98.1 F (36.7 C)-100 F (37.8 C)] 98.1 F (36.7 C) (09/24 1600) Pulse Rate:  [89-125] 89 (09/24 1800) Resp:  [24-35] 30 (09/24 1800) BP: (113-169)/(50-87) 142/60 mmHg (09/24 1800) SpO2:  [100 %] 100 % (09/24 1800) FiO2 (%):  [30 %] 30 % (09/24 1606) Weight:  [74.8 kg (164 lb 14.5 oz)] 74.8 kg (164 lb 14.5 oz) (09/24 0500)  Intake/Output from previous day: 09/23 0701 - 09/24 0700 In: 3620 [I.V.:1735; NG/GT:1100; IV Piggyback:725] Out: 2600 [Urine:2600] Intake/Output this shift: Total I/O In: 1476.3 [I.V.:763.8; NG/GT:420; IV Piggyback:292.5] Out: 800 [Urine:800]  Physical Exam: Unresponsive, minimally reactive to noxious stimulus.  Lab Results:  Recent Labs  12/05/13 0500 12/06/13 0615  WBC 12.6* 14.5*  HGB 7.9* 8.2*  HCT 23.2* 24.0*  PLT 295 409*   BMET  Recent Labs  12/05/13 2050 12/06/13 0615  NA 139 141  K 3.5* 3.4*  CL 108 110  CO2 15* 16*  GLUCOSE 157* 127*  BUN 34* 35*  CREATININE 0.68 0.67  CALCIUM 8.4 8.6    Studies/Results: Dg Chest Port 1 View  12/06/2013   CLINICAL DATA:  Hypoxia  EXAM: PORTABLE CHEST - 1 VIEW  COMPARISON:  December 05, 2013  FINDINGS: Central catheter tip is 4.5 cm above the carina. Central catheter tip is at the cavoatrial junction. Feeding tube tip is in the stomach. No pneumothorax. There is a small left effusion. Elsewhere lungs are clear. Heart size and pulmonary vascularity are normal. Aorta is somewhat tortuous but stable.  IMPRESSION: Tube and catheter positions as described without pneumothorax. Small left effusion. Elsewhere lungs clear. Aortic tortuosity stable.   Electronically Signed   By: Lowella Grip M.D.   On: 12/06/2013 07:13   Dg Chest Port 1 View  12/05/2013   CLINICAL DATA:  Endotracheal tube  EXAM: PORTABLE CHEST - 1 VIEW  COMPARISON:  12/03/2013  FINDINGS: Cardiomediastinal silhouette is  stable. Endotracheal tube in place with tip 4.4 cm above the carina. Stable feeding tube position with tip within proximal stomach. No acute infiltrate or pulmonary edema. There is right arm PICC line with tip in SVC right atrium junction. No pneumothorax. No pulmonary edema. Mild left basilar atelectasis or infiltrate.  IMPRESSION: Stable support apparatus. Right arm PICC line in place. Mild left basilar atelectasis or infiltrate.   Electronically Signed   By: Lahoma Crocker M.D.   On: 12/05/2013 08:04    Assessment/Plan: Patient unresponsive and moribund.  I reviewed situation with patient's son and strongly recommended in no uncertain terms the withdrawal of care and that further treatment is medically futile and not in the patient's best interests.    LOS: 18 days    Peggyann Shoals, MD 12/06/2013, 6:21 PM

## 2013-12-06 NOTE — Progress Notes (Signed)
PULMONARY / CRITICAL CARE MEDICINE   Name: Carla Hoover MRN: [id number] DOB: [birth year]    ADMISSION DATE:  12/09/2013 CONSULTATION DATE:  11/18/2013  REFERRING MD :  Erline Levine  CHIEF COMPLAINT:  Weakness  INITIAL PRESENTATION:  63 yo female fell and hit her head one day prior to admit.  Presented to [place] with Lt sided weakness and facial drop.  CT head showed ICH with SAH.  She required intubation prior to transfer to [facility].  Reintubated 9/13 for vasospasm & worsening mental status. PCCM consulted for vent/medical management. Develop  STUDIES:  9/06 CT head ([place]) 9/07 Cerebral arteriogram >> 7 mm Lt MCA Aneurysm, vasospasm of Rt MCA 9/09 CT head >> no change in ICH, SAH, SDH 9/11 CT head >> unchanged ICH or MCA infarct changes 9/13 head CT- 6 mm shift, no change in ICH  SIGNIFICANT EVENTS: 9/06 Transfer from [place] to [facility] 9/11 More lethargic, Lt side weaker >> start [facility] therapy  9/13 ETT placed, hyponatemia  SUBJECTIVE:  No events overnight, unresponsive  VITAL SIGNS: Temp:  [97.1 F (36.2 C)-100 F (37.8 C)] 100 F (37.8 C) (09/24 0323) Pulse Rate:  [84-125] 107 (09/24 0900) Resp:  [26-36] 33 (09/24 0900) BP: (126-169)/(50-87) 169/75 mmHg (09/24 0900) SpO2:  [100 %] 100 % (09/24 0900) FiO2 (%):  [30 %] 30 % (09/24 0805) Weight:  [74.8 kg (164 lb 14.5 oz)] 74.8 kg (164 lb 14.5 oz) (09/24 0500)  INTAKE / OUTPUT:  Intake/Output Summary (Last 24 hours) at 12/06/13 1054 Last data filed at 12/06/13 0700  Gross per 24 hour  Intake   3185 ml  Output   2600 ml  Net    585 ml   PHYSICAL EXAMINATION:  Gen: acutely ill, intubated, RASS-5 HEENT NG tube PULM:clear BL CV: RRR, no mgr AB: BS+, soft, nontender Ext: warm no edema Neuro:moves RUE/LE to  pain, left hemiparesis,pupils dilated reactive BL, lt plantar upgoing,does not follow commands  LABS:  CBC  Recent Labs Lab 12/03/13 0550 12/05/13 0500 12/06/13 0615  WBC 21.2* 12.6* 14.5*   HGB 9.0* 7.9* 8.2*  HCT 26.0* 23.2* 24.0*  PLT 256 295 409*   Coag's No results found for this basename: APTT, INR,  in the last 168 hours BMET  Recent Labs Lab 12/05/13 0500 12/05/13 2050 12/06/13 0615  NA 140 139 141  K 2.9* 3.5* 3.4*  CL 108 108 110  CO2 16* 15* 16*  BUN 36* 34* 35*  CREATININE 0.73 0.68 0.67  GLUCOSE 180* 157* 127*   Electrolytes  Recent Labs Lab 12/02/13 0542  12/05/13 0500 12/05/13 2050 12/06/13 0615  CALCIUM 8.5  < > 8.3* 8.4 8.6  MG 1.3*  --  1.6  --  1.8  PHOS 2.9  --  4.4  --  4.2  < > = values in this interval not displayed.  ABG  Recent Labs Lab 12/05/13 0350 12/06/13 0500  PHART 7.388 7.365  PCO2ART 25.9* 28.4*  PO2ART 128.0* 125.0*   Liver Enzymes No results found for this basename: AST, ALT, ALKPHOS, BILITOT, ALBUMIN,  in the last 168 hours Imaging Dg Chest Port 1 View  12/05/2013   CLINICAL DATA:  Endotracheal tube  EXAM: PORTABLE CHEST - 1 VIEW  COMPARISON:  12/03/2013  FINDINGS: Cardiomediastinal silhouette is stable. Endotracheal tube in place with tip 4.4 cm above the carina. Stable feeding tube position with tip within proximal stomach. No acute infiltrate or pulmonary edema. There is right arm PICC line with tip in  SVC right atrium junction. No pneumothorax. No pulmonary edema. Mild left basilar atelectasis or infiltrate.  IMPRESSION: Stable support apparatus. Right arm PICC line in place. Mild left basilar atelectasis or infiltrate.   Electronically Signed   By: Lahoma Crocker M.D.   On: 12/05/2013 08:04   ASSESSMENT / PLAN: NEUROLOGIC A:   ICH with SAH with concern for vasospasm/MCA stroke  Hx of anxiety, depression, insomnia  Hx of ETOH abuse Seizure likely, cEEG did not show P:   -Valproate for seizure prevention per neurology -Nimodipine x 21 days -Continue simvastatin -Continue HHH therapy >> goal SBP 160 to 180 with neo gtt- defer to neuro sx if we can stop this based on TCDs -CVP to goal 10,ct NS @  75/h -Continue prozac -Thiamine, folic acid -Unfortunately mental status has not improved, communicated with NS and neuro, recommend withdrawal at this point and I am in full agreement, made DNR per neuro and now awaiting decision on when to withdrawal, trach/peg are not an option here.  PULMONARY OETT 9/06 >> 9/09, 9/13 >> A: Acute respiratory failure in setting of ICH Bibasilar Atelectasis with mild hypoxemia Hx of COPD, tobacco abuse P:   - Oxygen to keep SpO2 > 92% - PRN albuterol - Hold PS trials at this point, recommend full comfort care and withdrawal.  CARDIOVASCULAR aline 9/11 >> CVL 9/11 >> A:  Hx of CAD, hyperlipidemia, HTN, chronic systolic heart failure, AAA. P:  -Hold ASA, plavix in setting of ICH until okay with neurology/neurosurgery -Hold lopressor, imdur, lasix while getting HHH therapy -Neo as needed for goal SBP 160-180 vs MAP 80-105 -Continue simvastatin (uses pravachol as outpt)   RENAL A:   Hyponatremia Decline in Na with associated seizures  R/o siadh (as limited progress and na drop on nacl), doubt CSW Hypokaleia/hypomag P:   Ct florinef NS @ 75/h BMET daily Replete lytes as indicated  GASTROINTESTINAL A:   Protein calorie malnutrition Dysphagia Hx of IBS Diarrhea, c diff neg  P:   -Protonix for SUP -Panda tube for meds, TFs -Imodium ok  HEMATOLOGIC A:   Anemia of critical illness. P:  -F/U CBC  INFECTIOUS A:   Asp likely fever & rising WC ? Line sepsis 9/19 vanc >> 9/20 aztreonam >> P:   Sputum cx 9/13 >> ng Panculture - Dc levaquin, add aztreonam/ vanc  ENDOCRINE A:   Hyperglycemia   P:   - ssi  TODAY'S SUMMARY: Made DNR per neurology, awaiting decision to terminally extubate, palliative care saw.  Will continue to follow.  Care during the described time interval was provided by me and/or other providers on the critical care team.  I have reviewed this patient's available data, including medical history, events of  note, physical examination and test results as part of my evaluation  CC time x 71m  Rush Farmer, M.D. [facility] Pulmonary/Critical Care Medicine. Pager: [phone]. After hours pager: [phone].

## 2013-12-06 NOTE — Progress Notes (Signed)
STROKE TEAM PROGRESS NOTE   HISTORY  63 y.o. 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 She has surrounding cytotoxic edema and midline shift and is employed left MCA aneurysm as well.    SUBJECTIVE (INTERVAL HISTORY)  Remains unresponsive.  Patient's family met with the yesterday afternoon and after prolonged discussion with the son and explained poor prognosis he agreed to DO NOT RESUSCITATE status and meet with palliative care team. Discussed with Drs Vertell Limber and Nelda Marseille and will make patient DNR OBJECTIVE  Filed Vitals:   12/06/13 1800 12/06/13 1900 12/06/13 1954 12/06/13 2000  BP: 142/60 153/68  137/58  Pulse: 89 116  89  Temp:   98.4 F (36.9 C)   TempSrc:   Oral   Resp: 30 33  26  Height:      Weight:      SpO2: 100% 100%  100%    Recent Labs  Lab  11/14/2013 1900  11/21/2013 0245  11/20/13 0550  11/21/13 0500   NA  132*  128*  131*  131*   K  3.6*  3.7  3.2*  2.7*   CL  93*  92*  96  95*   CO2  23  20  22  22    GLUCOSE  142*  148*  109*  124*   BUN  10  11  10  9    CREATININE  0.99  0.98  0.82  0.72   CALCIUM  8.5  8.0*  7.2*  7.0*   MG  1.0*  --  --  --     Recent Labs  Lab  12/07/2013 1900  11/20/13 0550   AST  74*  49*   ALT  37*  24   ALKPHOS  122*  86   BILITOT  1.2  0.9   PROT  6.8  5.8*   ALBUMIN  3.3*  2.6*     Recent Labs  Lab  11/23/2013 2345  12/04/2013 0245  11/20/13 0550  11/21/13 0500   WBC  --  7.1  8.0  7.8   NEUTROABS  --  --  6.2  --   HGB  --  11.1*  9.6*  9.9*   HCT  --  32.0*  28.0*  28.6*   MCV  --  100.3*  99.6  99.3   PLT  205  186  248  215     Recent Labs   12/10/2013 1900   LABPROT  12.5   INR  0.93      Component  Value  Date/Time    CHOL  118  05/18/2012 0512    TRIG  90  11/13/2013 1900    HDL  66  05/18/2012 0512    CHOLHDL  1.8   05/18/2012 0512    VLDL  14  05/18/2012 0512    LDLCALC  38  05/18/2012 0512    Ct Angio Head W/cm &/or Wo Cm  11/24/2013 1. Unchanged appearance of extensive, predominantly right-sided subarachnoid hemorrhage as well as 2.7 cm hematoma in the region of the right insula. Moderate surrounding edema with 5 mm of unchanged midline shift. 2. No right MCA aneurysm is identified, however there is severe attenuation of the right MCA and its branch vessels which may reflect local mass effect and vasospasm secondary to surrounding hemorrhage, and  this could obscure an underlying right MCA aneurysm. Narrowing of the ACAs and right ICA terminus also may reflect vasospasm. Catheter angiography may be helpful for further evaluation. 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  Dg Chest 1 View  11/21/2013 Improved aeration of the left lung base with mild basilar atelectasis remaining  11/20/2013 Slight increase in left basilar opacity most consistent with atelectasis.  11/17/2013 No significant change in mild opacity in the lateral left lung base.  11/22/2013 Endotracheal tube and nasogastric tube in appropriate position. Stable mild opacity in lateral left lung base.  Ct Head Wo Contrast  11/25/2013 Hemorrhagic right MCA infarct is stable. 6 mm midline shift unchanged. No new findings. 11/23/2013 1. Unchanged intracranial hemorrhage as above. 2. Unchanged edema in the right temporal, frontal, and parietal lobes, consistent with right MCA infarct. Similar degree of leftward midline shift. 11/21/2013 1. No significant interval change in intraparenchymal, subarachnoid, and subdural hemorrhage as compared to 11/20/13. Associated vasogenic edema is similar with stable 4-5 mm of right-to-left midline shift. No hydrocephalus. 2. No new hemorrhage.  11/20/2013 1. No change in the intracranial hemorrhage since the most recent prior study. Right frontal parenchymal hematoma is stable in size as is the degree of mass  effect. Subarachnoid hemorrhage and areas of subdural hemorrhage are stable. No new intracranial hemorrhage. 2. Mass effect and midline shift ir stable. 3. No hydrocephalus.  11/17/2013 Similar extensive right greater than left subarachnoid blood, with a trace possible posterior falcine subdural hematoma. Evolving 2.7 x 2.4 cm right insular hematoma with similar 5 mm right to left midline shift. No hydrocephalus.  TCD  11/15/2013 Limited study due to poor windows and neck strap limiting evaluation. Low normal mean flow velocities in identified vessels of anterior circulation. Globally elevated pulsatility indices suggest increase intracranial presure or diffuse intracranial atherosclerosis.  EEG LTM 11/27/13 Clinical interpretation: This 24 hours of intensive EEG monitoring with simultaneous video monitoring did not record any clinical or subclinical seizures. Background activities were marked by background activities slowing non- reactive to external stimuli, suggestive of a severe encephalopathy of nonspecific etiologies including toxic metabolic pharmacologic multifocal degenerative etiologies. Right hemisphere background slowing was more prominent and attenuated , lacking runs of faster frequencies. These Findings are suggestive of an additional neuronal dysfunction across the right hemisphere. There is no epileptiform discharges clinical or subclinical seizures present.  Clinical correlation is advised.  PHYSICAL EXAM  Filed Vitals:   12/06/13 1800 12/06/13 1900 12/06/13 1954 12/06/13 2000  BP: 142/60 153/68  137/58  Pulse: 89 116  89  Temp:   98.4 F (36.9 C)   TempSrc:   Oral   Resp: 30 33  26  Height:      Weight:      SpO2: 100% 100%  100%   Physical Exam ;  [name] intubated. Afebrile. Head is nontraumatic. Neck is supple without bruit. Cardiac exam no murmur or gallop. Lungs are coarse rhonchi bilaterally. Distal pulses are well felt.   Neurologic Examination:   Intubated and not on sedation. Unresponsive to voice, not following commands. Minimal response to sternal rub on the right side. Perrl. Eyes conjugate, no gaze deviation,+oculocephalic reflex, +cough on suction. no abnormal movements noted,   withdrawal to painful stimuli, on right and not in LUE and mildly in LLE  ASSESSMENT/PLAN  Carla Hoover is a 63 y.o. female with history of CHF, depression, elevated cholesterol, COPD, MI, and CAD on aspirin and plavix  s/p fall where she hit her head. Presenting to [facility] 12/12/2013 with left sided weakness, facial droop and headache. Intubated there and transferred to [facility]. Imaging confirms a right SAH, R IPH, with right MCA territory edema and hypodensity, concerning for right MCA stroke due to vasospasm. Developed partial seizure episodes, increased depakote and load with vimpat. Hyponatremia with likely CNS salt wasting, on 3% saline. Intubated due to airway protection. Na up to 126 today from 119 yesterday.  Hemorrhage: R insular IPH with R perisylvian SAH & R SDH likely from R MCA aneurysm not seen on angio, now with new neurological deficits likely related to vasospasm with vasogenic edema and mass effect  aspirin 81 mg orally every day and clopidogrel 75 mg orally every day prior to admission, held now secondary to IPH Vasospasm confirmed on angiogram and treated with verapamil IA.  Concerning for right MCA stroke due to vasospasm Repeat CT stable lovenox for VTE prophylaxis  NPO  Bedrest  Resultant Left hemiplegia I had a long telephone discussion with the patient's son on 12/03/13 about her prognosis and the likely need for tracheostomy, PEG tube placement to facilitate her nursing care but she is likely to have significant neurological disability and need 24 hour nursing care for a long period of time. I also discussed DO NOT RESUSCITATE status with the patient's son would like more time to think about this and would like to pursue  aggressive medical care for now Plan to meet with family today at 3 pm Will place palliative care consult  Respiratory failure - intubated on no sedation - vent per CCM  Partial seizure - left UE shaking episodes, none noted overnight - depakote increased to 799m tid - increase vimpat maintenance dose to 1032mbid - d/c LTM EEG  Hyponatremia - likely salt wasting - 3% saline stopped by NSG.  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 Currently on Zocor 402mAlcohol withdraw - on thiamine supplement - ativan d/c'ed due to interfere with mental status check  Other Stroke Risk Factors  Advanced age  Cigarette smoker, advised to stop smoking  ETOH use, etiology of fall. Coronary artery disease - MI, angioplasty 2013 Ischemic cardiomyopathy  Other Pertinent History  AAA, infrarenal  R hepatic lobe lesion  This patient is critically ill and at significant risk of neurological worsening, death and care requires constant monitoring of vital signs, hemodynamics,respiratory and cardiac monitoring,review of multiple databases, neurological assessment, discussion with family, other specialists and medical decision making of high complexity.I have made any additions or clarifications directly to the above note.  I spent 30 minutes of neurocritical care time  in the care of  this patient.   PraAntony ContrasD Stroke Neurology 12/06/2013 10:32 PM   To contact Stroke Continuity provider, please refer to [email or web address], contact General Neurology

## 2013-12-07 ENCOUNTER — Inpatient Hospital Stay (HOSPITAL_COMMUNITY): Payer: Medicare Other

## 2013-12-07 LAB — BASIC METABOLIC PANEL
ANION GAP: 14 (ref 5–15)
BUN: 34 mg/dL — ABNORMAL HIGH (ref 6–23)
CHLORIDE: 112 meq/L (ref 96–112)
CO2: 16 mEq/L — ABNORMAL LOW (ref 19–32)
CREATININE: 0.61 mg/dL (ref 0.50–1.10)
Calcium: 7.1 mg/dL — ABNORMAL LOW (ref 8.4–10.5)
GFR calc Af Amer: >90 mL/min (ref >90)
GFR calc non Af Amer: >90 mL/min (ref >90)
Glucose, Bld: 157 mg/dL — ABNORMAL HIGH (ref 70–99)
Potassium: 4.8 mEq/L (ref 3.7–5.3)
Sodium: 142 mEq/L (ref 137–147)

## 2013-12-07 LAB — CBC
HEMATOCRIT: 21.8 % — AB (ref 36.0–46.0)
Hemoglobin: 7.3 g/dL — ABNORMAL LOW (ref 12.0–15.0)
MCH: 33.3 pg (ref 26.0–34.0)
MCHC: 33.5 g/dL (ref 30.0–36.0)
MCV: 99.5 fL (ref 78.0–100.0)
Platelets: 449 10*3/uL — ABNORMAL HIGH (ref 150–400)
RBC: 2.19 MIL/uL — ABNORMAL LOW (ref 3.87–5.11)
RDW: 14.3 % (ref 11.5–15.5)
WBC: 13 10*3/uL — AB (ref 4.0–10.5)

## 2013-12-07 LAB — CULTURE, BLOOD (ROUTINE X 2)
Culture: NO GROWTH
Culture: NO GROWTH

## 2013-12-07 LAB — BLOOD GAS, ARTERIAL
ACID-BASE DEFICIT: 7.6 mmol/L — AB (ref 0.0–2.0)
Bicarbonate: 16.2 mEq/L — ABNORMAL LOW (ref 20.0–24.0)
Drawn by: 31101
FIO2: 0.3 %
O2 SAT: 99 %
PEEP: 5 cmH2O
PO2 ART: 160 mmHg — AB (ref 80.0–100.0)
Patient temperature: 98.6
RATE: 12 resp/min
TCO2: 17 mmol/L (ref 0–100)
VT: 420 mL
pCO2 arterial: 26.4 mmHg — ABNORMAL LOW (ref 35.0–45.0)
pH, Arterial: 7.405 (ref 7.350–7.450)

## 2013-12-07 LAB — MAGNESIUM: Magnesium: 1.1 mg/dL — ABNORMAL LOW (ref 1.5–2.5)

## 2013-12-07 LAB — GLUCOSE, CAPILLARY
GLUCOSE-CAPILLARY: 119 mg/dL — AB (ref 70–99)
GLUCOSE-CAPILLARY: 135 mg/dL — AB (ref 70–99)
GLUCOSE-CAPILLARY: 142 mg/dL — AB (ref 70–99)

## 2013-12-07 LAB — PHOSPHORUS: PHOSPHORUS: 3.4 mg/dL (ref 2.3–4.6)

## 2013-12-07 MED ORDER — MORPHINE SULFATE 10 MG/ML IJ SOLN
10.0000 mg/h | INTRAMUSCULAR | Status: DC
  Administered 2013-12-07: 10 mg/h via INTRAVENOUS
  Administered 2013-12-07: 4 mg/h via INTRAVENOUS
  Administered 2013-12-08: 10 mg/h via INTRAVENOUS
  Filled 2013-12-07 (×4): qty 10

## 2013-12-07 MED ORDER — MORPHINE BOLUS VIA INFUSION
5.0000 mg | INTRAVENOUS | Status: DC | PRN
  Administered 2013-12-07 (×3): 10 mg via INTRAVENOUS
  Filled 2013-12-07: qty 20

## 2013-12-07 NOTE — Progress Notes (Signed)
STROKE TEAM PROGRESS NOTE   HISTORY  63 y.o. 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 She has surrounding cytotoxic edema and midline shift and is employed left MCA aneurysm as well.    SUBJECTIVE (INTERVAL HISTORY)  Remains unresponsive.  Niece at bedside . Plan for withdrawal of care today. OBJECTIVE  Filed Vitals:   12/07/13 0800 12/07/13 0809 12/07/13 0900 12/07/13 1000  BP: 161/71 161/71 151/58 181/61  Pulse: 105 111 109 114  Temp:      TempSrc:      Resp: 31 30 32 32  Height:      Weight:      SpO2: 100% 100% 100% 100%    Recent Labs  Lab  11/23/2013 1900  12/12/2013 0245  11/20/13 0550  11/21/13 0500   NA  132*  128*  131*  131*   K  3.6*  3.7  3.2*  2.7*   CL  93*  92*  96  95*   CO2  23  20  22  22    GLUCOSE  142*  148*  109*  124*   BUN  10  11  10  9    CREATININE  0.99  0.98  0.82  0.72   CALCIUM  8.5  8.0*  7.2*  7.0*   MG  1.0*  --  --  --     Recent Labs  Lab  11/24/2013 1900  11/20/13 0550   AST  74*  49*   ALT  37*  24   ALKPHOS  122*  86   BILITOT  1.2  0.9   PROT  6.8  5.8*   ALBUMIN  3.3*  2.6*     Recent Labs  Lab  12/02/2013 2345  12/09/2013 0245  11/20/13 0550  11/21/13 0500   WBC  --  7.1  8.0  7.8   NEUTROABS  --  --  6.2  --   HGB  --  11.1*  9.6*  9.9*   HCT  --  32.0*  28.0*  28.6*   MCV  --  100.3*  99.6  99.3   PLT  205  186  248  215     Recent Labs   12/07/2013 1900   LABPROT  12.5   INR  0.93      Component  Value  Date/Time    CHOL  118  05/18/2012 0512    TRIG  90  12/10/2013 1900    HDL  66  05/18/2012 0512    CHOLHDL  1.8  05/18/2012 0512    VLDL  14  05/18/2012 0512    LDLCALC  38  05/18/2012 0512    Ct Angio Head W/cm &/or Wo Cm  11/19/2013 1. Unchanged appearance of extensive, predominantly right-sided subarachnoid hemorrhage as well  as 2.7 cm hematoma in the region of the right insula. Moderate surrounding edema with 5 mm of unchanged midline shift. 2. No right MCA aneurysm is identified, however there is severe attenuation of the right MCA and its branch vessels which may reflect local mass effect and vasospasm secondary to surrounding hemorrhage, and this could obscure an underlying right MCA aneurysm. Narrowing of the ACAs and right ICA terminus also may reflect vasospasm. Catheter angiography may be helpful for further evaluation. 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  4. Occluded right vertebral artery.  Dg Chest 1 View  11/21/2013 Improved aeration of the left lung base with mild basilar atelectasis remaining  11/20/2013 Slight increase in left basilar opacity most consistent with atelectasis.  11/18/2013 No significant change in mild opacity in the lateral left lung base.  11/29/2013 Endotracheal tube and nasogastric tube in appropriate position. Stable mild opacity in lateral left lung base.  Ct Head Wo Contrast  11/25/2013 Hemorrhagic right MCA infarct is stable. 6 mm midline shift unchanged. No new findings. 11/23/2013 1. Unchanged intracranial hemorrhage as above. 2. Unchanged edema in the right temporal, frontal, and parietal lobes, consistent with right MCA infarct. Similar degree of leftward midline shift. 11/21/2013 1. No significant interval change in intraparenchymal, subarachnoid, and subdural hemorrhage as compared to 11/20/13. Associated vasogenic edema is similar with stable 4-5 mm of right-to-left midline shift. No hydrocephalus. 2. No new hemorrhage.  11/20/2013 1. No change in the intracranial hemorrhage since the most recent prior study. Right frontal parenchymal hematoma is stable in size as is the degree of mass effect. Subarachnoid hemorrhage and areas of subdural hemorrhage are stable. No new intracranial hemorrhage. 2. Mass effect and midline shift ir stable. 3. No hydrocephalus.  12/07/2013 Similar extensive right  greater than left subarachnoid blood, with a trace possible posterior falcine subdural hematoma. Evolving 2.7 x 2.4 cm right insular hematoma with similar 5 mm right to left midline shift. No hydrocephalus.  TCD  11/18/2013 Limited study due to poor windows and neck strap limiting evaluation. Low normal mean flow velocities in identified vessels of anterior circulation. Globally elevated pulsatility indices suggest increase intracranial presure or diffuse intracranial atherosclerosis.  EEG LTM 11/27/13 Clinical interpretation: This 24 hours of intensive EEG monitoring with simultaneous video monitoring did not record any clinical or subclinical seizures. Background activities were marked by background activities slowing non- reactive to external stimuli, suggestive of a severe encephalopathy of nonspecific etiologies including toxic metabolic pharmacologic multifocal degenerative etiologies. Right hemisphere background slowing was more prominent and attenuated , lacking runs of faster frequencies. These Findings are suggestive of an additional neuronal dysfunction across the right hemisphere. There is no epileptiform discharges clinical or subclinical seizures present.  Clinical correlation is advised.  PHYSICAL EXAM  Filed Vitals:   12/07/13 0800 12/07/13 0809 12/07/13 0900 12/07/13 1000  BP: 161/71 161/71 151/58 181/61  Pulse: 105 111 109 114  Temp:      TempSrc:      Resp: 31 30 32 32  Height:      Weight:      SpO2: 100% 100% 100% 100%   Physical Exam ;  [name] intubated. Afebrile. Head is nontraumatic. Neck is supple without bruit. Cardiac exam no murmur or gallop. Lungs are coarse rhonchi bilaterally. Distal pulses are well felt.   Neurologic Examination:  Intubated and not on sedation. Unresponsive to voice, not following commands. Minimal response to sternal rub on the right side. Perrl. Eyes conjugate, no gaze deviation,+oculocephalic reflex, +cough on suction. no  abnormal movements noted,   withdrawal to painful stimuli, on right and not in LUE and mildly in LLE  ASSESSMENT/PLAN  Ms. JOYELLE SIEDLECKI is a 63 y.o. female with history of CHF, depression, elevated cholesterol, COPD, MI, and CAD on aspirin and plavix s/p fall where she hit her head. Presenting to [facility] 11/21/2013 with left sided weakness, facial droop and headache. Intubated there and transferred to [facility]. Imaging confirms a right SAH, R IPH, with right MCA territory edema  and hypodensity, concerning for right MCA stroke due to vasospasm. Developed partial seizure episodes, increased depakote and load with vimpat. Hyponatremia with likely CNS salt wasting, on 3% saline. Intubated due to airway protection. Na up to 126 today from 119 yesterday.  Hemorrhage: R insular IPH with R perisylvian SAH & R SDH likely from R MCA aneurysm not seen on angio, now with new neurological deficits likely related to vasospasm with vasogenic edema and mass effect  aspirin 81 mg orally every day and clopidogrel 75 mg orally every day prior to admission, held now secondary to IPH Vasospasm confirmed on angiogram and treated with verapamil IA.  Concerning for right MCA stroke due to vasospasm Repeat CT stable lovenox for VTE prophylaxis  NPO  Bedrest  Resultant Left hemiplegia I had a long telephone discussion with the patient's son on 12/03/13 about her prognosis and the likely need for tracheostomy, PEG tube placement to facilitate her nursing care but she is likely to have significant neurological disability and need 24 hour nursing care for a long period of time. I also discussed DO NOT RESUSCITATE status with the patient's son would like more time to think about this and would like to pursue aggressive medical care for now Plan to meet with family today at 3 pm Will place palliative care consult  Respiratory failure - intubated on no sedation - vent per CCM  Partial seizure - left UE shaking  episodes, none noted overnight - depakote increased to 750mg  tid - increase vimpat maintenance dose to 100mg  bid - d/c LTM EEG  Hyponatremia - likely salt wasting - 3% saline stopped by NSG.  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 Coronary artery disease - MI, angioplasty 2013 Ischemic cardiomyopathy  Other Pertinent History  AAA, infrarenal  R hepatic lobe lesion   Plan withdrawal of care this am as per CCM Antony Contras, MD Stroke Neurology 12/07/2013 1:56 PM   To contact Stroke Continuity provider, please refer to [email or web address]  After hours, contact General Neurology

## 2013-12-07 NOTE — Progress Notes (Signed)
12/07/13 1100  Clinical Encounter Type  Visited With Patient and family together  Visit Type Follow-up;Spiritual support;Patient actively dying  Referral From [place] Prayer;Emotional;Grief support  Stress Factors  Patient Stress Factors None identified  Family Stress Factors Loss   Chaplain met with patient's family for about an hour this morning. Chaplain was asked by another chaplain to follow-up with the family, as many major medical decisions were made by the family today regarding the patient's care. Chaplain was asked to pray with the patient and the family circled around in prayer as well. Most of the family present was understanding of the patient's situation and all were actively grieving in their own ways. The patient's sister expressed stress and anxiety regarding the patient's status before God. More specifically, patient's sister was worried about the patient's eternal place of rest and whether or not she had been forgiven. Patient's sister articulated a recent time where her and the patient shared a moment of confessing their sins before God. Chaplain affirmed the sister's concerns and also affirmed that that moment of confessing they had was valuable to God. Chaplain affirmed that the patient would no longer be in pain soon. Both Chaplain and family had to leave the room so the nurses could do some preparations. Chaplain plans to follow up with the family after the patient is taken off the machines that are helping her breathe. Gar Ponto, Chaplain 11:15 AM

## 2013-12-07 NOTE — Clinical Social Work Note (Signed)
Clinical Social Worker received referral for comfort care support for family who is withdrawing support from patient.  CSW has spoken with RN and Chaplin regarding patient family.  Patient family has already built rapport/relationship with Chaplin who will continue to follow patient family through withdraw process.  RN to notify CSW if further needs arise following patient death.  CSW remains available for outside support to family and staff as needed.  Jesse Ezeriah Luty, LCSW [phone] 

## 2013-12-07 NOTE — Progress Notes (Signed)
UR completed.  Laren Orama, RN BSN MHA CCM Trauma/Neuro ICU Case Manager [phone]  

## 2013-12-07 NOTE — Progress Notes (Signed)
Subjective: Patient reports (vent support)  Objective: Vital signs in last 24 hours: Temp:  [97.8 F (36.6 C)-98.6 F (37 C)] 97.8 F (36.6 C) (09/25 0400) Pulse Rate:  [84-116] 84 (09/25 0700) Resp:  [23-36] 24 (09/25 0700) BP: (113-184)/(52-86) 130/59 mmHg (09/25 0700) SpO2:  [100 %] 100 % (09/25 0700) FiO2 (%):  [30 %] 30 % (09/25 0600) Weight:  [76.9 kg (169 lb 8.5 oz)] 76.9 kg (169 lb 8.5 oz) (09/25 0417)  Intake/Output from previous day: 09/24 0701 - 09/25 0700 In: 4237.5 [I.V.:1930; NG/GT:1140; IV Piggyback:517.5] Out: 2051 [Urine:2050; Stool:1] Intake/Output this shift:    Unresponsive, without change.  Son has reportedly relinquished decision-making to neice.  Planned withdrawal of support today.  Lab Results:  Recent Labs  12/06/13 0615 12/07/13 0100  WBC 14.5* 13.0*  HGB 8.2* 7.3*  HCT 24.0* 21.8*  PLT 409* 449*   BMET  Recent Labs  12/06/13 0615 12/07/13 0100  NA 141 142  K 3.4* 4.8  CL 110 112  CO2 16* 16*  GLUCOSE 127* 157*  BUN 35* 34*  CREATININE 0.67 0.61  CALCIUM 8.6 7.1*    Studies/Results: Dg Chest Port 1 View  12/06/2013   CLINICAL DATA:  Hypoxia  EXAM: PORTABLE CHEST - 1 VIEW  COMPARISON:  December 05, 2013  FINDINGS: Central catheter tip is 4.5 cm above the carina. Central catheter tip is at the cavoatrial junction. Feeding tube tip is in the stomach. No pneumothorax. There is a small left effusion. Elsewhere lungs are clear. Heart size and pulmonary vascularity are normal. Aorta is somewhat tortuous but stable.  IMPRESSION: Tube and catheter positions as described without pneumothorax. Small left effusion. Elsewhere lungs clear. Aortic tortuosity stable.   Electronically Signed   By: Lowella Grip M.D.   On: 12/06/2013 07:13    Assessment/Plan:   LOS: 19 days  Current plan to withdraw support this morning d/t poor prognosis without evidence of improvement.   Verdis Prime 12/07/2013, 7:43 AM

## 2013-12-07 NOTE — Progress Notes (Signed)
Transfer note:  Arrival Method: Bed from [facility]  Mental Orientation: Sleeping Telemetry:  Assessment:  Skin: Intact dry IV: PICC Pain: No pain stated or observed Tubes: Foley cath Family present

## 2013-12-07 NOTE — Procedures (Signed)
Extubation Procedure Note  Patient Details:   Name: Carla Hoover DOB: 1950-05-19 MRN: [id number]   Airway Documentation:     Evaluation  O2 sats: stable throughout Complications: No apparent complications Patient did tolerate procedure well. Bilateral Breath Sounds: Clear;Diminished Suctioning: Oral;Airway No  Pt extubated to [place] per Dr Nelda Marseille at 11:05. Pt placed on 1L [place]. No Complications. Slight stridor and snoring noted. RN aware.    Jesse Sans 12/07/2013, 11:29 AM

## 2013-12-07 NOTE — Progress Notes (Signed)
PULMONARY / CRITICAL CARE MEDICINE   Name: Carla Hoover MRN: [id number] DOB: Jul 13, 1950    ADMISSION DATE:  11/23/2013 CONSULTATION DATE:  11/18/2013  REFERRING MD :  Erline Levine  CHIEF COMPLAINT:  Weakness  INITIAL PRESENTATION:  63 yo female fell and hit her head one day prior to admit.  Presented to [place] with Lt sided weakness and facial drop.  CT head showed ICH with SAH.  She required intubation prior to transfer to [facility].  Reintubated 9/13 for vasospasm & worsening mental status. PCCM consulted for vent/medical management. Develop  STUDIES:  9/06 CT head ([place]) 9/07 Cerebral arteriogram >> 7 mm Lt MCA Aneurysm, vasospasm of Rt MCA 9/09 CT head >> no change in ICH, SAH, SDH 9/11 CT head >> unchanged ICH or MCA infarct changes 9/13 head CT- 6 mm shift, no change in ICH  SIGNIFICANT EVENTS: 9/06 Transfer from [place] to [facility] 9/11 More lethargic, Lt side weaker >> start [facility] therapy  9/13 ETT placed, hyponatemia  SUBJECTIVE:  No events overnight, unresponsive  VITAL SIGNS: Temp:  [97.8 F (36.6 C)-98.6 F (37 C)] 98.2 F (36.8 C) (09/25 0753) Pulse Rate:  [84-116] 109 (09/25 0900) Resp:  [23-36] 32 (09/25 0900) BP: (113-184)/(52-78) 151/58 mmHg (09/25 0900) SpO2:  [100 %] 100 % (09/25 0900) FiO2 (%):  [30 %] 30 % (09/25 0809) Weight:  [76.9 kg (169 lb 8.5 oz)] 76.9 kg (169 lb 8.5 oz) (09/25 0417)  INTAKE / OUTPUT:  Intake/Output Summary (Last 24 hours) at 12/07/13 1026 Last data filed at 12/07/13 0800  Gross per 24 hour  Intake 4287.5 ml  Output   1751 ml  Net 2536.5 ml   PHYSICAL EXAMINATION:  Gen: acutely ill, intubated, RASS-5 HEENT NG tube PULM:clear BL CV: RRR, no mgr AB: BS+, soft, nontender Ext: warm no edema Neuro:moves RUE/LE to  pain, left hemiparesis,pupils dilated reactive BL, lt plantar upgoing,does not follow commands  LABS:  CBC  Recent Labs Lab 12/05/13 0500 12/06/13 0615 12/07/13 0100  WBC 12.6* 14.5* 13.0*   HGB 7.9* 8.2* 7.3*  HCT 23.2* 24.0* 21.8*  PLT 295 409* 449*   Coag's No results found for this basename: APTT, INR,  in the last 168 hours BMET  Recent Labs Lab 12/05/13 2050 12/06/13 0615 12/07/13 0100  NA 139 141 142  K 3.5* 3.4* 4.8  CL 108 110 112  CO2 15* 16* 16*  BUN 34* 35* 34*  CREATININE 0.68 0.67 0.61  GLUCOSE 157* 127* 157*   Electrolytes  Recent Labs Lab 12/05/13 0500 12/05/13 2050 12/06/13 0615 12/07/13 0100  CALCIUM 8.3* 8.4 8.6 7.1*  MG 1.6  --  1.8 1.1*  PHOS 4.4  --  4.2 3.4    ABG  Recent Labs Lab 12/05/13 0350 12/06/13 0500 12/07/13 0500  PHART 7.388 7.365 7.405  PCO2ART 25.9* 28.4* 26.4*  PO2ART 128.0* 125.0* 160.0*   Liver Enzymes No results found for this basename: AST, ALT, ALKPHOS, BILITOT, ALBUMIN,  in the last 168 hours Imaging Dg Chest Port 1 View  12/06/2013   CLINICAL DATA:  Hypoxia  EXAM: PORTABLE CHEST - 1 VIEW  COMPARISON:  December 05, 2013  FINDINGS: Central catheter tip is 4.5 cm above the carina. Central catheter tip is at the cavoatrial junction. Feeding tube tip is in the stomach. No pneumothorax. There is a small left effusion. Elsewhere lungs are clear. Heart size and pulmonary vascularity are normal. Aorta is somewhat tortuous but stable.  IMPRESSION: Tube and catheter positions as  described without pneumothorax. Small left effusion. Elsewhere lungs clear. Aortic tortuosity stable.   Electronically Signed   By: Lowella Grip M.D.   On: 12/06/2013 07:13   ASSESSMENT / PLAN: NEUROLOGIC A:   ICH with SAH with concern for vasospasm/MCA stroke  Hx of anxiety, depression, insomnia  Hx of ETOH abuse Seizure likely, cEEG did not show P:   - Morphine drip for comfort.  PULMONARY OETT 9/06 >> 9/09, 9/13 >> A: Acute respiratory failure in setting of ICH Bibasilar Atelectasis with mild hypoxemia Hx of COPD, tobacco abuse P:   - Terminal extubation today.  CARDIOVASCULAR aline 9/11 >> CVL 9/11 >> A:  Hx of  CAD, hyperlipidemia, HTN, chronic systolic heart failure, AAA. P:  - D/C anti-HTN medications.  RENAL A:   Hyponatremia Decline in Na with associated seizures  R/o siadh (as limited progress and na drop on nacl), doubt CSW Hypokaleia/hypomag P:   - KVO IVF and d/c blood draws.  GASTROINTESTINAL A:   Protein calorie malnutrition Dysphagia Hx of IBS Diarrhea, c diff neg  P:   - D/C TF.  HEMATOLOGIC A:   Anemia of critical illness. P:  - D/C further blood draws.  INFECTIOUS A:   Asp likely fever & rising WC ? Line sepsis 9/19 vanc >> 9/20 aztreonam >> P:   - D/C abx.  ENDOCRINE A:   Hyperglycemia   P:   - D/C CBGs.  TODAY'S SUMMARY: Spoke with patient's son and entire family, after discussion decision was made to make patient a full NCB and proceed with comfort care.  Morphine to be started then terminal extubation today.  PCCM will sign off, please call back if needed.  Care during the described time interval was provided by me and/or other providers on the critical care team.  I have reviewed this patient's available data, including medical history, events of note, physical examination and test results as part of my evaluation  CC time x 67m  Rush Farmer, M.D. [facility] Pulmonary/Critical Care Medicine. Pager: [phone]. After hours pager: [phone].

## 2013-12-07 NOTE — Progress Notes (Signed)
12/07/13 1100  Clinical Encounter Type  Visited With Patient and family together  Visit Type Follow-up;Spiritual support;Patient actively dying  Referral From [place] Prayer;Emotional;Grief support  Stress Factors  Patient Stress Factors None identified  Family Stress Factors Loss   Chaplain met with patient's family again. Family is continuing to grieve silently. Patient's niece said she would notify Chaplain if anything changed or if the family felt they needed a Chaplain. Gar Ponto, Chaplain 11:58 AM

## 2013-12-08 MED ORDER — LORAZEPAM 2 MG/ML IJ SOLN: 1.0000 mg | INTRAMUSCULAR | Status: DC | PRN

## 2013-12-08 MED ORDER — ACETAMINOPHEN 650 MG RE SUPP: 650.0000 mg | RECTAL | Status: DC | PRN

## 2013-12-08 MED ORDER — SODIUM CHLORIDE 0.9 % IV SOLN
1.0000 mg/h | INTRAVENOUS | Status: DC
  Administered 2013-12-08: 1 mg/h via INTRAVENOUS
  Filled 2013-12-08: qty 10

## 2013-12-08 MED ORDER — MORPHINE SULFATE 2 MG/ML IJ SOLN: 1.0000 mg | INTRAMUSCULAR | Status: DC | PRN

## 2013-12-08 MED ORDER — MORPHINE SULFATE 2 MG/ML IJ SOLN: 2.0000 mg | INTRAMUSCULAR | Status: DC | PRN

## 2013-12-08 MED ORDER — SCOPOLAMINE 1 MG/3DAYS TD PT72
1.0000 | MEDICATED_PATCH | TRANSDERMAL | Status: DC
  Administered 2013-12-08: 1.5 mg via TRANSDERMAL
  Filled 2013-12-08: qty 1

## 2013-12-08 MED ORDER — SODIUM CHLORIDE 0.9 % IJ SOLN: 10.0000 mL | INTRAMUSCULAR | Status: DC | PRN

## 2013-12-13 NOTE — Progress Notes (Signed)
  This NP attempted to contact Carla Hoover her son as discussed at noon today without success.  I updated Dr Leonie Man and at his recommendation discussed with Carla Hoover (chapalin) with Ethics Committee the current situation.     Reported to unit nursing that I had not been in contact with the family.  Nursing reports that medical team had been in contact with patient's son Carla Hoover and that he had voiced desire for second opinion  from [place] but on further consideration understanding the logistics,  has made decision to transition medical decision making to his cousin Carla Hoover.  I recommended that that transition of medical decision making be documented, and I notified Carla Hoover to the fact that no further Ethics support was being requested.  Primary team managing all aspects of care.  Wadie Lessen NP  Palliative Medicine Team Team Phone # [phone] Pager [phone]

## 2013-12-13 NOTE — Progress Notes (Signed)
Patient ID: Carla Hoover, female   DOB: April 22, 1950, 63 y.o.   MRN: [id number] Patient was seen earlier today. Unresponsive to deep pain. Breathing approximately 16-18 times per minute with good pulse.  This afternoon patient's son requested a phone call and I contacted him to discuss the situation explained comfort measures and the fact that so she was removed from the ventilator is uncertain how long patient will go on breathing further bone. The expected that she would not survive more than 30 minutes after she was extubated. I reassured the patient's son that she is not experiencing any significant pain nor she being poisoned with morphine unnecessarily other than for control of pain.

## 2013-12-13 NOTE — Progress Notes (Signed)
Postmortem Note:  Incident: Nurse called to the room by Nurse Tech Aldona Bar Diouf) to recheck patient.  Assessment: On assessment patient had no respirations or heart sounds. Assessed patient pulse, which resulted in being pulseless. Extremities were cold to touch and mottled.   Confirmation: Sima Matas RN confirmed these findings as well.   Time of Death: 07-02-39  [place] [facility]: Notified. Not a donor candidate. Referral Number: [id number] Leonor Liv)  Family: Niece Edwin Dada was notified at 01-Jul-2128. Collect funeral home information from her as well.  Morgue: Placed in the morgue.  Chaplin: Not notified. No family at beside  Patient Placement: Notified.  Medical Examiner: Notified. Wynona Canes will examine the body. No autopsy needed he stated.  Attending Physician: MD Hilma Favors notified at 07-01-53. On call Neuro MD notified 07-02-2103 (1st page) then again July 02, 2218 (2nd page). MD Kristeen Miss returned paged.   Medications: Wasted 70cc IV dilaudid with Sima Matas, RN in the sink  [employer], RN-BC [facility] [place] Number: [phone]

## 2013-12-13 NOTE — Discharge Summary (Signed)
Physician Discharge Summary  Patient ID: Carla Hoover MRN: [id number] DOB/AGE: 08/18/1950 63 y.o.  Admit date: 11/17/2013 Discharge date: January 02, 2014  Admission Diagnoses: Right frontal temporal parietal intracranial hemorrhage. Seizure disorder  Discharge Diagnoses: Right frontal temporal parietal intracranial hemorrhage, seizure disorder Active Problems:   ICH (intracerebral hemorrhage)   Seizures   Palliative care encounter   Altered mental status   Discharged Condition: Death  Hospital Course: Patient was admitted and severely obtunded state having had a large intracerebral hemorrhage with evidence of subarachnoid extension. She was worked up with a CT scan and CT angiography which revealed no evidence of an aneurysm. The patient's condition remained poor clinically and after 15 days and much discussion with the family it was decided that aggressive resuscitation should not further be pursued. She was made a no CPR on the 24th and she expired peacefully on the 26th.  Consults: pulmonary/intensive care neurology  Significant Diagnostic Studies: angiography: Cerebral angiography  Treatments: Supportive medical care in ICU  Discharge Exam: Blood pressure 92/40, pulse 115, temperature 96.1 F (35.6 C), temperature source Oral, resp. rate 22, height 5\' 4"  (1.626 m), weight 75.751 kg (167 lb), SpO2 82.00%. Patient expired  Disposition: Expired     Medication List    ASK your doctor about these medications       ADVAIR DISKUS 100-50 MCG/DOSE Aepb  Generic drug:  Fluticasone-Salmeterol  Inhale 1 puff into the lungs every 12 (twelve) hours.     amLODipine 5 MG tablet  Commonly known as:  NORVASC  Take 5 mg by mouth daily.     aspirin 81 MG tablet  Take 81 mg by mouth daily.     clopidogrel 75 MG tablet  Commonly known as:  PLAVIX  Take 1 tablet (75 mg total) by mouth daily.     FLUoxetine 20 MG capsule  Commonly known as:  PROZAC  Take 20 mg by mouth daily.     furosemide 20 MG tablet  Commonly known as:  LASIX  Take 20 mg by mouth daily.     isosorbide mononitrate 60 MG 24 hr tablet  Commonly known as:  IMDUR  Take 1 tablet (60 mg total) by mouth daily.     LORazepam 1 MG tablet  Commonly known as:  ATIVAN  Take 1 tablet (1 mg total) by mouth every 6 (six) hours as needed for anxiety.     metoprolol 50 MG tablet  Commonly known as:  LOPRESSOR  Take 50 mg by mouth 2 (two) times daily.     nitroGLYCERIN 0.4 MG SL tablet  Commonly known as:  NITROSTAT  Place 0.4 mg under the tongue every 5 (five) minutes as needed for chest pain. For chest pain     pantoprazole 40 MG tablet  Commonly known as:  PROTONIX  Take 1 tablet (40 mg total) by mouth daily before breakfast.     polyethylene glycol packet  Commonly known as:  MIRALAX / GLYCOLAX  Take 17 g by mouth daily.     pravastatin 80 MG tablet  Commonly known as:  PRAVACHOL  Take 80 mg by mouth daily.         SignedEarleen Newport 2014-01-02, 10:31 PM

## 2013-12-13 NOTE — Progress Notes (Signed)
STROKE TEAM PROGRESS NOTE   HISTORY  63 y.o. 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 She has surrounding cytotoxic edema and midline shift and is employed left MCA aneurysm as well.    SUBJECTIVE (INTERVAL HISTORY)  Remains unresponsive.  Niece and cousin at bedside. Son contacted on their request by phone. Very length discuss by Dr Merlene Laughter with family bout poor prognosis and likely functional outcome. Son very concern about no feeds and very reluctant to continue morphine drip. He wants to hold it for a while. He wants more time to make further long term decision. Son with a lot of ambivalence.     OBJECTIVE  Filed Vitals:   12/07/13 0809 12/07/13 0900 12/07/13 1000 12/07/13 1817  BP: 161/71 151/58 181/61 101/52  Pulse: 111 109 114 104  Temp:    98.1 F (36.7 C)  TempSrc:    Oral  Resp: 30 32 32 22  Height:      Weight:      SpO2: 100% 100% 100% 92%    Recent Labs  Lab  12/01/2013 1900  11/21/2013 0245  11/20/13 0550  11/21/13 0500   NA  132*  128*  131*  131*   K  3.6*  3.7  3.2*  2.7*   CL  93*  92*  96  95*   CO2  23  20  22  22    GLUCOSE  142*  148*  109*  124*   BUN  10  11  10  9    CREATININE  0.99  0.98  0.82  0.72   CALCIUM  8.5  8.0*  7.2*  7.0*   MG  1.0*  --  --  --     Recent Labs  Lab  11/23/2013 1900  11/20/13 0550   AST  74*  49*   ALT  37*  24   ALKPHOS  122*  86   BILITOT  1.2  0.9   PROT  6.8  5.8*   ALBUMIN  3.3*  2.6*     Recent Labs  Lab  11/14/2013 2345  11/26/2013 0245  11/20/13 0550  11/21/13 0500   WBC  --  7.1  8.0  7.8   NEUTROABS  --  --  6.2  --   HGB  --  11.1*  9.6*  9.9*   HCT  --  32.0*  28.0*  28.6*   MCV  --  100.3*  99.6  99.3   PLT  205  186  248  215     Recent Labs   11/17/2013 1900   LABPROT  12.5   INR  0.93       Component  Value  Date/Time    CHOL  118  05/18/2012 0512    TRIG  90  11/27/2013 1900    HDL  66  05/18/2012 0512    CHOLHDL  1.8  05/18/2012 0512    VLDL  14  05/18/2012 0512    LDLCALC  38  05/18/2012 0512    Ct Angio Head W/cm &/or Wo Cm  12/10/2013 1. Unchanged appearance of extensive, predominantly right-sided subarachnoid hemorrhage as well as 2.7 cm hematoma in the region of the right insula. Moderate surrounding edema with 5 mm of unchanged midline shift. 2. No right MCA aneurysm is identified,  however there is severe attenuation of the right MCA and its branch vessels which may reflect local mass effect and vasospasm secondary to surrounding hemorrhage, and this could obscure an underlying right MCA aneurysm. Narrowing of the ACAs and right ICA terminus also may reflect vasospasm. Catheter angiography may be helpful for further evaluation. 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  Dg Chest 1 View  11/21/2013 Improved aeration of the left lung base with mild basilar atelectasis remaining  11/20/2013 Slight increase in left basilar opacity most consistent with atelectasis.  12/11/2013 No significant change in mild opacity in the lateral left lung base.  12/03/2013 Endotracheal tube and nasogastric tube in appropriate position. Stable mild opacity in lateral left lung base.  Ct Head Wo Contrast  11/25/2013 Hemorrhagic right MCA infarct is stable. 6 mm midline shift unchanged. No new findings. 11/23/2013 1. Unchanged intracranial hemorrhage as above. 2. Unchanged edema in the right temporal, frontal, and parietal lobes, consistent with right MCA infarct. Similar degree of leftward midline shift. 11/21/2013 1. No significant interval change in intraparenchymal, subarachnoid, and subdural hemorrhage as compared to 11/20/13. Associated vasogenic edema is similar with stable 4-5 mm of right-to-left midline shift. No hydrocephalus. 2. No new hemorrhage.  11/20/2013 1. No change in the  intracranial hemorrhage since the most recent prior study. Right frontal parenchymal hematoma is stable in size as is the degree of mass effect. Subarachnoid hemorrhage and areas of subdural hemorrhage are stable. No new intracranial hemorrhage. 2. Mass effect and midline shift ir stable. 3. No hydrocephalus.  12/07/2013 Similar extensive right greater than left subarachnoid blood, with a trace possible posterior falcine subdural hematoma. Evolving 2.7 x 2.4 cm right insular hematoma with similar 5 mm right to left midline shift. No hydrocephalus.  TCD  11/26/2013 Limited study due to poor windows and neck strap limiting evaluation. Low normal mean flow velocities in identified vessels of anterior circulation. Globally elevated pulsatility indices suggest increase intracranial presure or diffuse intracranial atherosclerosis.  EEG LTM 11/27/13 Clinical interpretation: This 24 hours of intensive EEG monitoring with simultaneous video monitoring did not record any clinical or subclinical seizures. Background activities were marked by background activities slowing non- reactive to external stimuli, suggestive of a severe encephalopathy of nonspecific etiologies including toxic metabolic pharmacologic multifocal degenerative etiologies. Right hemisphere background slowing was more prominent and attenuated , lacking runs of faster frequencies. These Findings are suggestive of an additional neuronal dysfunction across the right hemisphere. There is no epileptiform discharges clinical or subclinical seizures present.  Clinical correlation is advised.  PHYSICAL EXAM  Filed Vitals:   12/07/13 0809 12/07/13 0900 12/07/13 1000 12/07/13 1817  BP: 161/71 151/58 181/61 101/52  Pulse: 111 109 114 104  Temp:    98.1 F (36.7 C)  TempSrc:    Oral  Resp: 30 32 32 22  Height:      Weight:      SpO2: 100% 100% 100% 92%   Physical Exam ;  [name] intubated. Afebrile. Head is nontraumatic. Neck is supple  without bruit. Cardiac exam no murmur or gallop. Lungs are coarse rhonchi bilaterally. Distal pulses are well felt.   Neurologic Examination:  On continuous morphine drip. No eye opening to deep pain. OD 4.49mm reactive; OS 58mm reactive; oculocephalic reflexes intact. Corneal intact both sides. No movement of limbs to deep pain.   HEAD CT from 9-13 reviewed in person: Large R MCA infarction w hemorrhagic transformation.    ASSESSMENT/PLAN  Carla Hoover is a 63 y.o. female with history of CHF, depression, elevated cholesterol, COPD, MI, and CAD on aspirin and plavix s/p fall where she hit her head. Presenting to [facility] 12/07/2013 with left sided weakness, facial droop and headache. Intubated there and transferred to [facility]. Imaging confirms a right SAH, R IPH, with right MCA territory edema and hypodensity, concerning for right MCA stroke due to vasospasm. Developed partial seizure episodes, increased depakote and load with vimpat. Hyponatremia with likely CNS salt wasting, on 3% saline. Intubated due to airway protection. Na up to 126 today from 119 yesterday.  Hemorrhage: R insular IPH with R perisylvian SAH & R SDH likely from R MCA aneurysm not seen on angio, now with new neurological deficits likely related to vasospasm with vasogenic edema and mass effect.  aspirin 81 mg orally every day and clopidogrel 75 mg orally every day prior to admission, held now secondary to IPH Vasospasm confirmed on angiogram and treated with verapamil IA.  Concerning for right MCA stroke due to vasospasm Repeat CT stable lovenox for VTE prophylaxis  NPO  Bedrest  Resultant Left hemiplegia ----  Note from 12-03-13 (((I had a long telephone discussion with the patient's son on 12/03/13 about her prognosis and the likely need for tracheostomy, PEG tube placement to facilitate her nursing care but she is likely to have significant neurological disability and need 24 hour nursing care for a long period of  time. I also discussed DO NOT RESUSCITATE status with the patient's son would like more time to think about this and would like to pursue aggressive medical care for now)))) Palliative care now following patient. Family members deciding on level of care.  Will hold continuous morphine for now per son's wishes.  Respiratory failure - extubated on sedation   Partial seizure - Observe for now. - d/c LTM EEG  Hyponatremia - likely salt wasting - 3% saline stopped by NSG.  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 Coronary artery disease - MI, angioplasty 2013 Ischemic cardiomyopathy  Other Pertinent History  AAA, infrarenal  R hepatic lobe lesion    Carla Bussing PA-C [facility] Pager [phone] 01-02-14, 2:08 PM  The patient was seen and examined by me; notes, chart and tests reviewed and discussed with midlevel provider, other providers,  and family.      To contact Stroke Continuity provider, please refer to [email or web address]  After hours, contact General Neurology

## 2013-12-13 NOTE — Progress Notes (Signed)
Palliative Medicine Team Progress Note  Responded to request by RN to re-address goals of care. Found patient with tachypnea and increased upper airway secretions -irregular paradoxical breathing pattern. Family is exhausted- they are upset that patient "did not die shortly after extubation" "we were told she may live 30 minutes"- because she has stabilized in a place of actively dying which commonly happens in brain injury at EOL they are second guessing their decision to withdrawal mechanical ventilation. According to nursing and from review of the record son has requested that we hold her morphine, and he is saying that it is inhumane to let her starve to death-he is asking about  inserting a feeding tube and resuming IV fluids and nutrition as well as othe medical interventions to prolong her life "in case there is a miracle". I met with patient's niece Bridgette and patient's cousin -both actively involved in decisions- patient's son has gone home to rest - is overwhelmed and second guessing- he is struggling with survivor guilt. He refused a meeting with me earlier today because he said "palliative isn't what he needs- he told his cousin that he needs a doctor that can answer his questions and not just try to end his mother's life. I made a courtesy visit to further explain how our team could continue to help- per Bridgette-the patient's niece- they were pleased with prior contact with palliative- but the patient's son was not wanting comfort.   In my experience families who have unrealistic requests or anxiety during the EOL process are usually not really asking for unreasonable things or for their loved one to suffer-but instead they usually have an unmet need may be something emotional or spiritual and often have gotten conflicting information from providers- an need the wisdom of their doctors to say that she is dying- there is no chance of recovery and reassurance that we and they are doing  everything that is medically reasonable, compassionate and courageous in the face of an unexpected event like stroke or impending death. There are clearly unfinished issues with their relationships which makes this very hard- her son will need counseling and the assistance of a grief counselor moving forward- ideally we need to get hospice involved if the family is willing and she survives until Monday.  Bridgette and the cousin say they have been told by [facility] the son to restart the morphine is she is in distress- so I have restarted a low dose of Morphine, a Scop patch and some PRN Ativan in case she has a seizure due to her brain injury. They have requested that I contact Dwayne in the [place] to answer his questions.  Recommendations:  1. Continue comfort care. 2. Restart Morphine infusion as the ethical and compassionate thing to do in the face of her current level of suffering and irreversible brain injury- she has also been on chronic high doses of opiates and a withdrawal syndrome may even evoke a seizure. 3. A feeding tube is not a reasonable medical intervention in her current state -paradoxical/unresponsive/unequal pupils/severe edema and may contribute to worsening dyspnea and pain--same goes for IV fluids- she is already congested in her chest. 4. I provided support and reiterated that Danessa is not dying nor will die of starvation- but she will die of a massive stroke and ICH. I also discussed the natural dying process and bodies tendency to release natural endorphins in low volume states. 5. Im not sure when nasal cannula O2 was applied but it probably isn't  helping her comfort - I have discussed with RN and we will leave this on for flow only- it is not to be escalated above 1L.  Will follow closely this weekend. If there are questions regarding medical interventions at EOL please do not hesitate to contact our team. [phone].  Lane Hacker, DO Palliative Medicine [phone]

## 2013-12-13 NOTE — Progress Notes (Signed)
Patient's family has many questions regarding patient's prognosis. Palliative care has not seen patient since 12/05/13. Dr. Hilma Favors notified and stated she would schedule a meeting with family ASAP. Family currently standing at desk and did not answer what time would be most convenient for them. Dr. Hilma Favors to follow up.  Joellen Jersey, RN.

## 2013-12-13 DEATH — deceased

## 2014-02-21 ENCOUNTER — Encounter (HOSPITAL_COMMUNITY): Payer: Self-pay | Admitting: Cardiovascular Disease

## 2016-03-10 IMAGING — XA IR CARO CERE HEAD/NECK UNILAT LEFT (MS)
1 series · 11 of 24 positions shown · IV contrast (IODINE)
Comparison: none

CLINICAL DATA: Right-sided subarachnoid hemorrhage and intra
cerebral hemorrhage.

[Series 300: neuro · 11 of 211 slices shown]
[im 10/211]
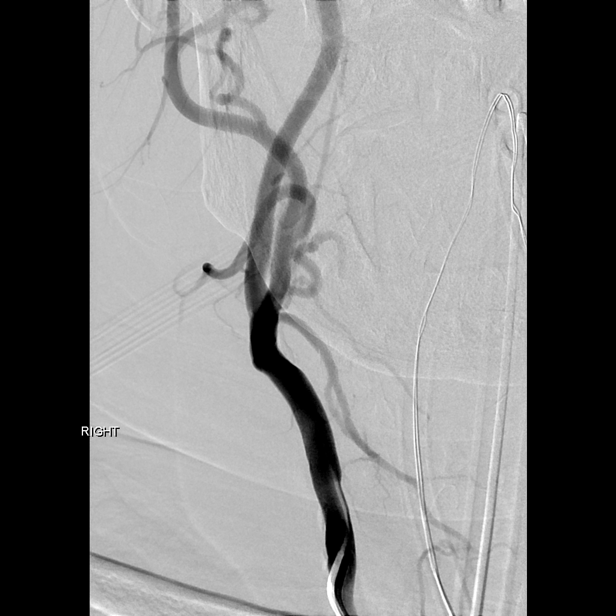
[im 28/211]
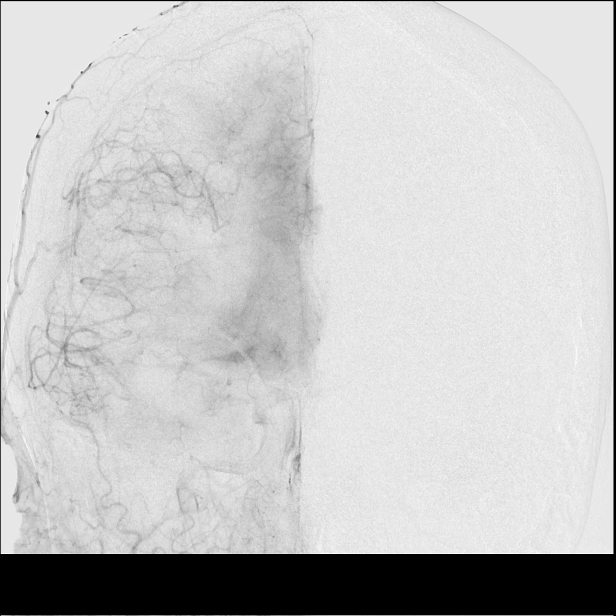
[im 46/211]
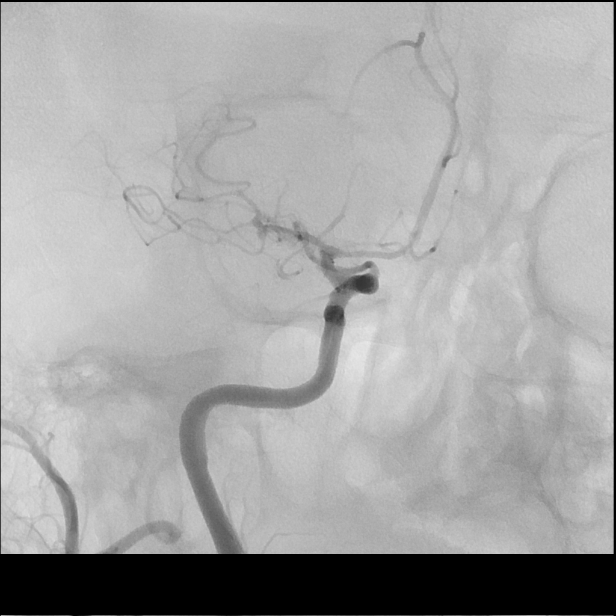
[im 64/211]
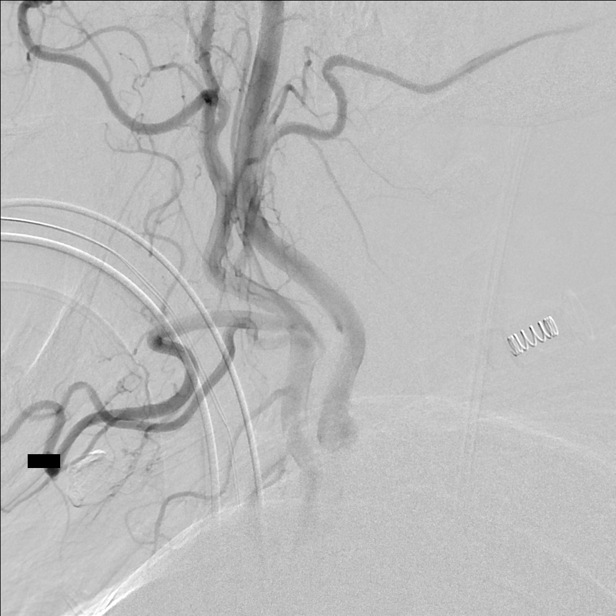
[im 83/211]
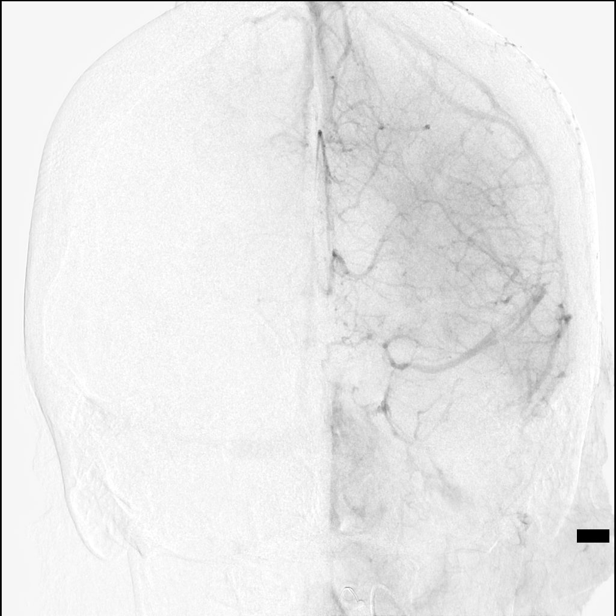
[im 110/211]
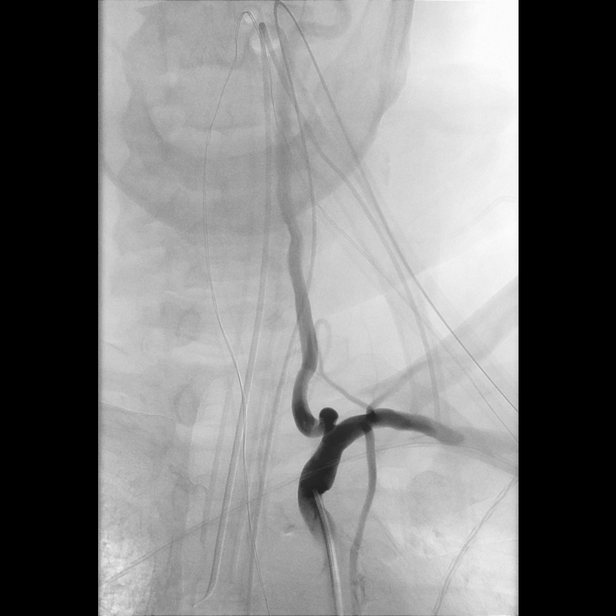
[im 128/211]
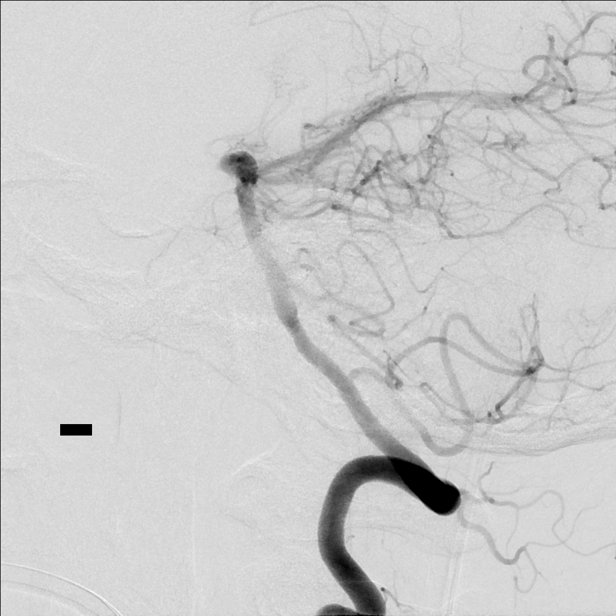
[im 147/211]
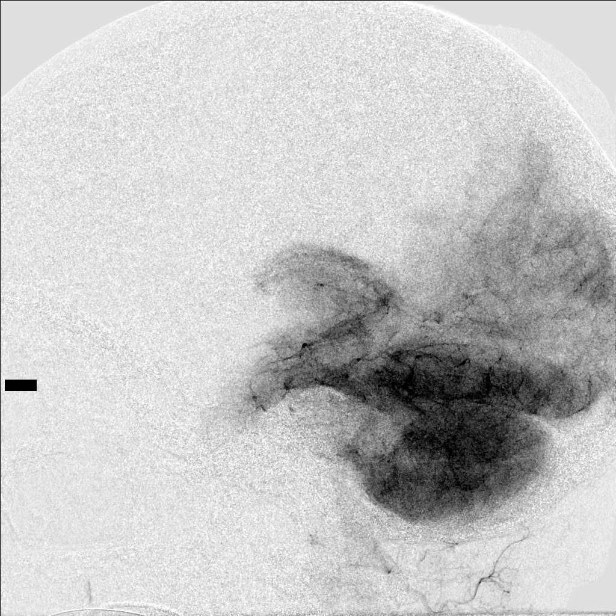
[im 165/211]
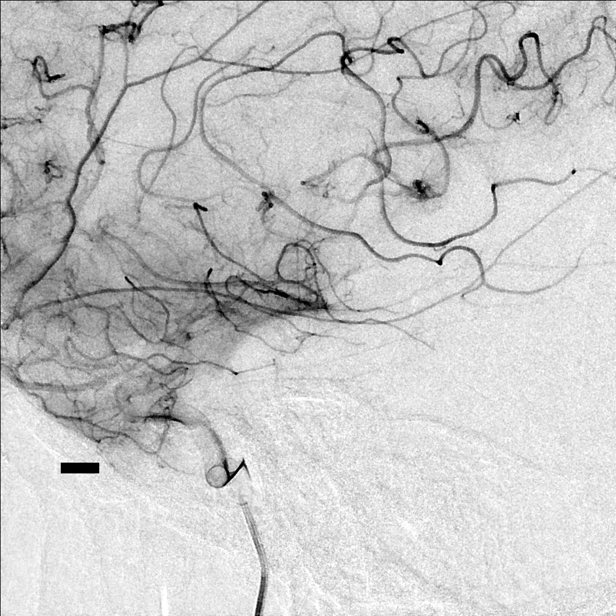
[im 183/211]
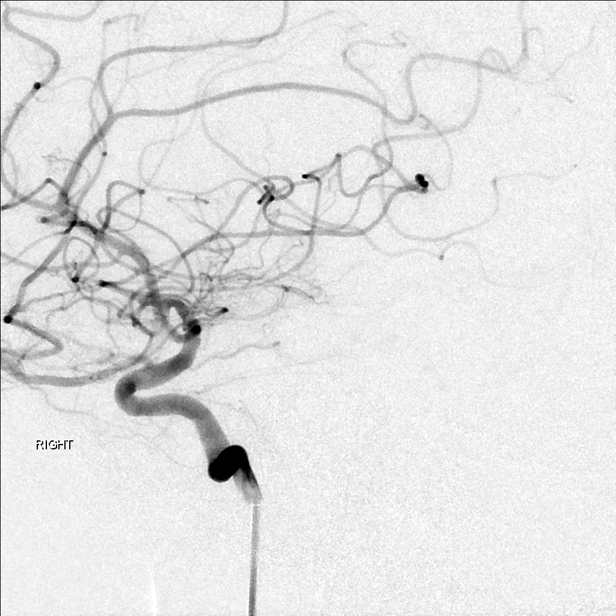
[im 201/211]
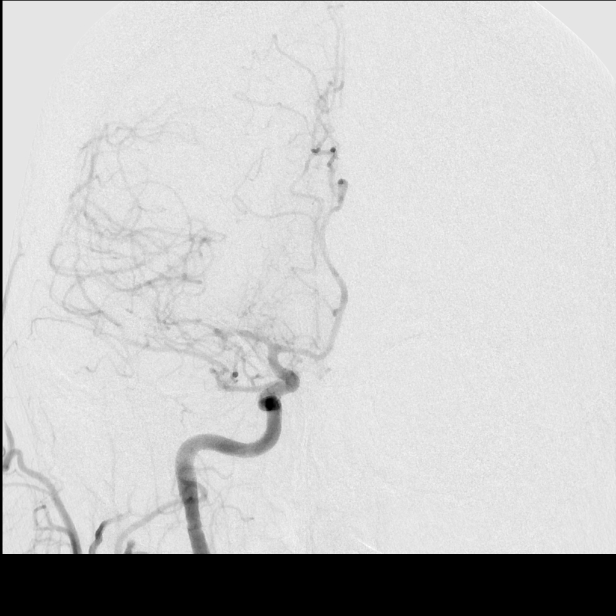

[11 of 24 positions shown; findings below may reference images not displayed]

EXAM:
IR ANGIO INTRA EXTRACRAN SEL INTERNAL CAROTID UNI RIGHT MOD SED
BILATERAL COMMON CAROTID ARTERY AND BILATERAL VERTEBRAL ARTERY
ANGIOGRAMS FOLLOWED BY ENDOVASCULAR TREATMENT OF SEVERE VASOSPASM OF
THE RIGHT MIDDLE CEREBRAL ARTERY BRANCHES USING INTRA-ARTERIAL
VERAPAMIL.

ANESTHESIA/SEDATION:
General.

MEDICATIONS:
As per general anesthesia.

CONTRAST:  120mL OMNIPAQUE IOHEXOL 300 MG/ML  SOLN

PROCEDURE:
Following a full explanation of the procedure along with the
potential associated complications, an informed witnessed consent
was obtained from the patient's sister.

The right groin was prepped and draped in the usual sterile fashion.
Thereafter using modified Seldinger technique, transfemoral access
into the right common femoral artery was obtained without
difficulty. Over a 0.035 inch guidewire, a 5 French Pinnacle sheath
was inserted. Through this, and also over a 0.035 inch guidewire, a
5 French JB1 catheter was advanced to the aortic arch region and
selectively positioned in the right common carotid artery, the right
subclavian artery, the left common carotid artery and the left
vertebral artery.

There were no acute complications. The patient tolerated the
procedure well.

COMPLICATIONS:
None immediate.
FINDINGS: The right subclavian arteriogram demonstrates the origin of the
right vertebral artery to be patent. However, there is significantly
delayed hemodynamic antegrade flow with subsequent complete
occlusion of the right vertebral artery at the level of C2.

The right common carotid arteriogram demonstrates the right external
carotid artery and its major branches to be normal.

The right internal carotid artery at the bulb has a smooth shallow
plaque without associated stenosis.

No ulcerations are seen.

The vessel is, otherwise, seen to opacify normally to the cranial
skull base. The petrous and the cavernous segments are widely
patent. There is mild smooth narrowing of the supraclinoid segment.
The right middle cerebral artery proximally extending to the right
middle cerebral artery M2 M3 branches demonstrate significantly
attenuated caliber throughout the distribution with delayed
antegrade flow more distally compared to the right anterior cerebral
artery distribution.

The right anterior cerebral A1 segment also demonstrates moderate to
severe smooth narrowing. There is mild mass-effect on the right A2
segment probably related to the underlying hematoma. A 3D rotational
arteriogram was then perfor[REDACTED]ed over the right middle
cerebral artery distribution. This failed to elucidate angiographic
evidence of an intracranial aneurysm at this time.

The left common carotid arteriogram demonstrates the left external
carotid artery and its major branches to be normal.

The left internal carotid artery at the bulb demonstrates a smooth
atherosclerotic plaque along the lateral posterior wall with an
associated narrowing of approximately 10 to 20% by the NASCET
criteria.

No acute ulcerations are seen. The vessel is, otherwise, seen to
opacify normally to the cranial skull base. The petrous, the
cavernous and the supraclinoid segments are widely patent.

The left middle cerebral artery and the left anterior cerebral
artery opacify normally into the capillary and venous phases.

There is cross-filling via the anterior communicating artery of the
right anterior cerebral artery A2 segment.

Again demonstrated is a mild right to left midline shift of the A2
segments of both anterior cerebral arteries as described earlier.

Arising in the left MCA bifurcation region is a bilobed aneurysm
projecting anteriorly. This measures approximately 7 mm x 6 mm, with
a small superiorly projecting daughter aneurysm. The left vertebral
artery origin is normal.

The vessel opacifies normally to the cranial skull base. The left
vertebrobasilar junction and the left posterior inferior cerebellar
artery opacify normally. The basilar artery, the posterior cerebral
arteries, the superior cerebellar arteries and the anterior inferior
cerebellar arteries opacify normally into the capillary and venous
phases.

Small bulbous configuration at the basilar artery apex probably
represents confluence of the vessels arising from this region.

Anastomosis of the left anterior inferior cerebellar artery with the
left posterior-inferior cerebellar artery complex is seen. There is
a stump of a right vertebrobasilar junction with no antegrade
clearance consistent with a right vertebral artery occlusion as
described earlier.

ENDOVASCULAR TREATMENT OF SEVERE VASOSPASM OF THE RIGHT MIDDLE
CEREBRAL ARTERY AND THE RIGHT ANTERIOR CEREBRAL ARTERY:

The angiographic findings were discussed with the referring
neurosurgeon.

It was decided to improve perfusion into the right cerebral
hemisphere to avoid a potential catastrophic ischemic infarct.
However, consideration of the fact that a possible aneurysm in the
right middle cerebral artery distribution had not been entirely
excluded just because it had not been angiographically demonstrated
this time.

After discussion with the family it was elected to proceed with
treatment of the right middle and the right anterior cerebral artery
distribution vasospasm using intra-arterial verapamil.

Using biplane roadmap technique and constant fluoroscopic guidance,
over a 0.035 inch Roadrunner guidewire, the 5 French JB1 catheter
was advanced to the distal cervical segment of the right internal
carotid artery. The guidewire was removed. Good aspiration was
obtained from the hub of the 5 French diagnostic catheter. A control
arteriogram performed showed no change in the intracranial
circulation.

At this time approximately 11 mL of verapamil was then infused
slowly via a pump over approximately 30 minutes. Throughout the
procedure, the patient's blood pressure and heart rate remained
stable.

At the end of this, a control arteriogram performed through the 5
French guide catheter in the right internal carotid artery
demonstrated a modestly improved caliber and flow through the right
middle and the right anterior cerebral artery distribution. No
abnormal filling defects were seen.

As far as noted was the release of the conical configuration at the
origin of the inferior division and also the superior division of
the right middle cerebral artery. An intracranial angioplasty was
not performed in view of the unprotected nature of the etiology of
the intracerebral and the subarachnoid hemorrhage.

The diagnostic catheter was then retrieved into the abdominal aorta
and then removed. The 5 French Pinnacle sheath was then successfully
retrieved with the application of an external closure device.
IMPRESSION: Severe vasospasm of the right middle cerebral artery distribution
and moderate severe of the right anterior cerebral artery A1 segment
with treatment and infusion intra-arterially of 11 mg of verapamil.

Unruptured left middle cerebral artery bifurcation aneurysm
measuring 7 mm x 6 mm with a small daughter aneurysm.

Occluded right vertebral artery at the level of C2.

## 2016-03-10 IMAGING — CR DG CHEST 1V PORT
1 series · 1 of 1 positions shown · non-contrast
Comparison: 11/18/2013

CLINICAL DATA: Acute respiratory failure. On ventilator.

EXAM:
PORTABLE CHEST - 1 VIEW

[AP]
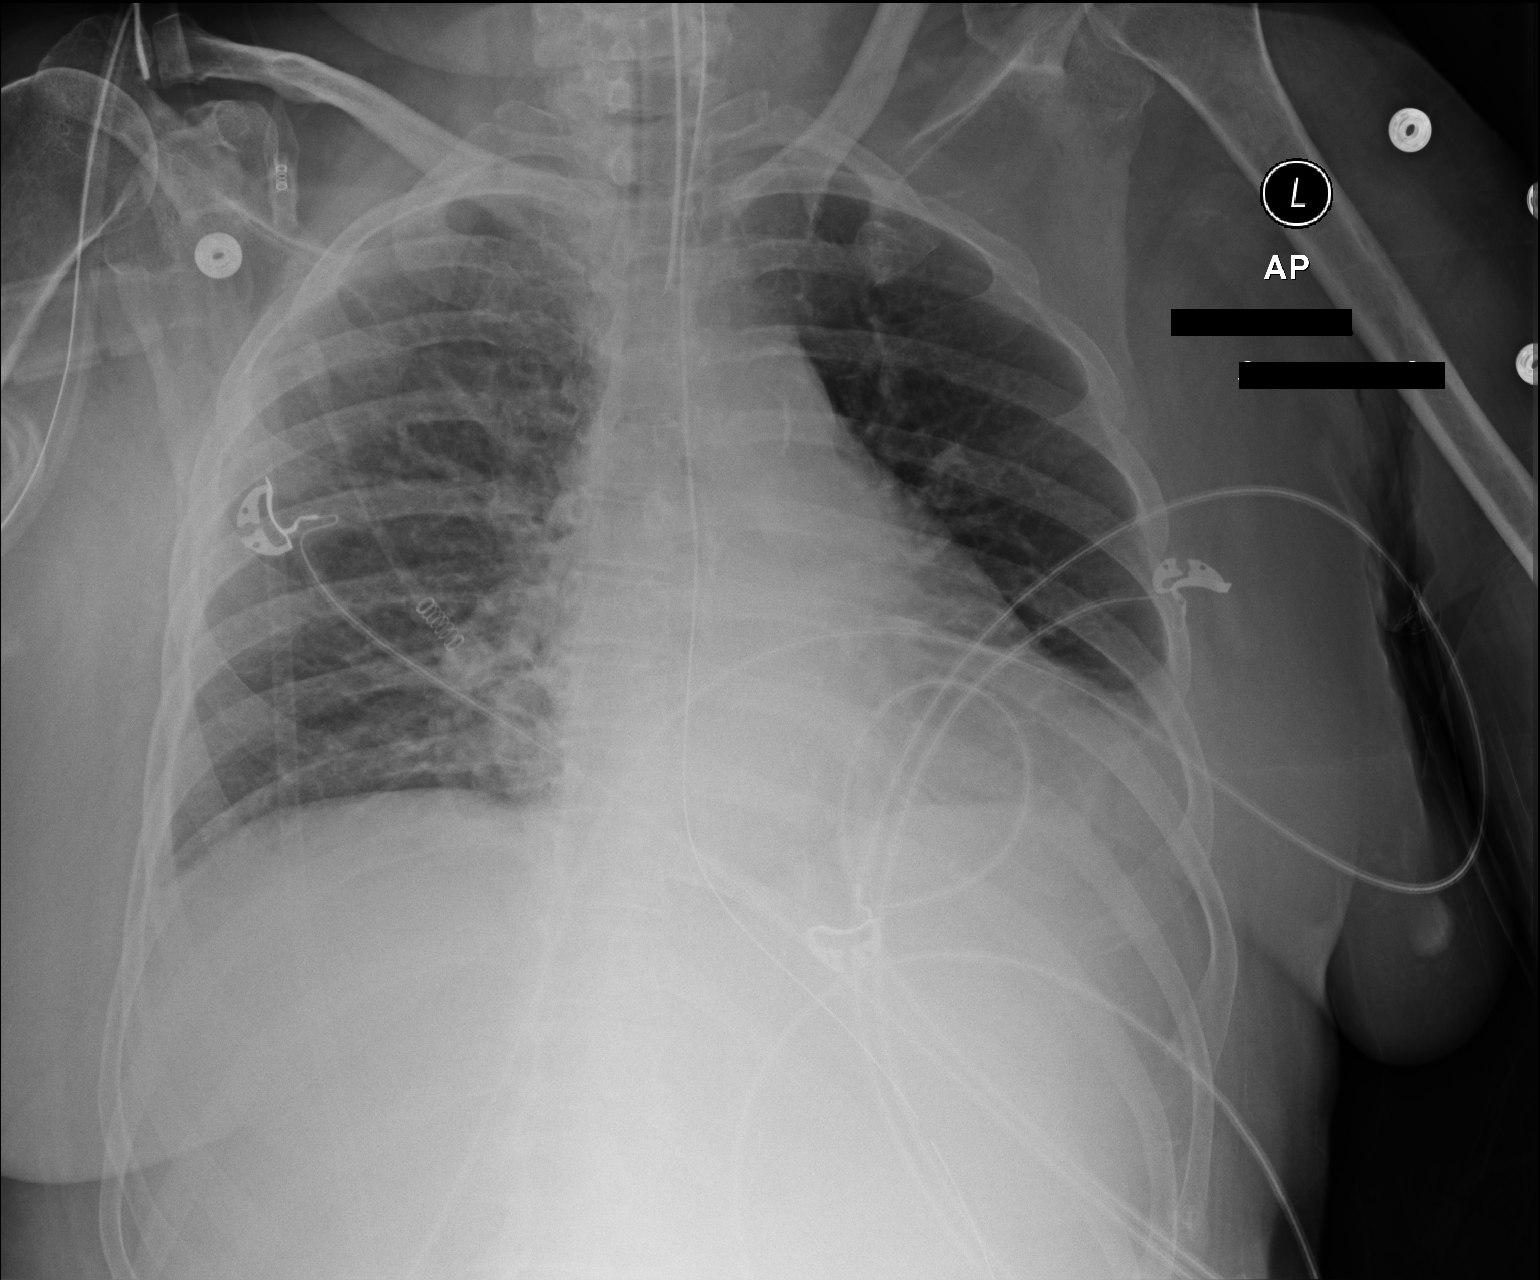

[1 of 1 positions shown; findings below may reference images not displayed]

FINDINGS: Endotracheal tube and nasogastric tube remain in appropriate
position. Mild opacity in lateral left lung base shows no
significant change. Lungs are otherwise clear. Heart size is within
normal limits. No evidence of pneumothorax.
IMPRESSION: No significant change in mild opacity in the lateral left lung base.

## 2016-03-12 IMAGING — CR DG CHEST 1V PORT
1 series · 1 of 1 positions shown · non-contrast
Comparison: Portable chest x-ray of [REDACTED]

CLINICAL DATA: Atelectasis, followup

EXAM:
PORTABLE CHEST - 1 VIEW

[AP]
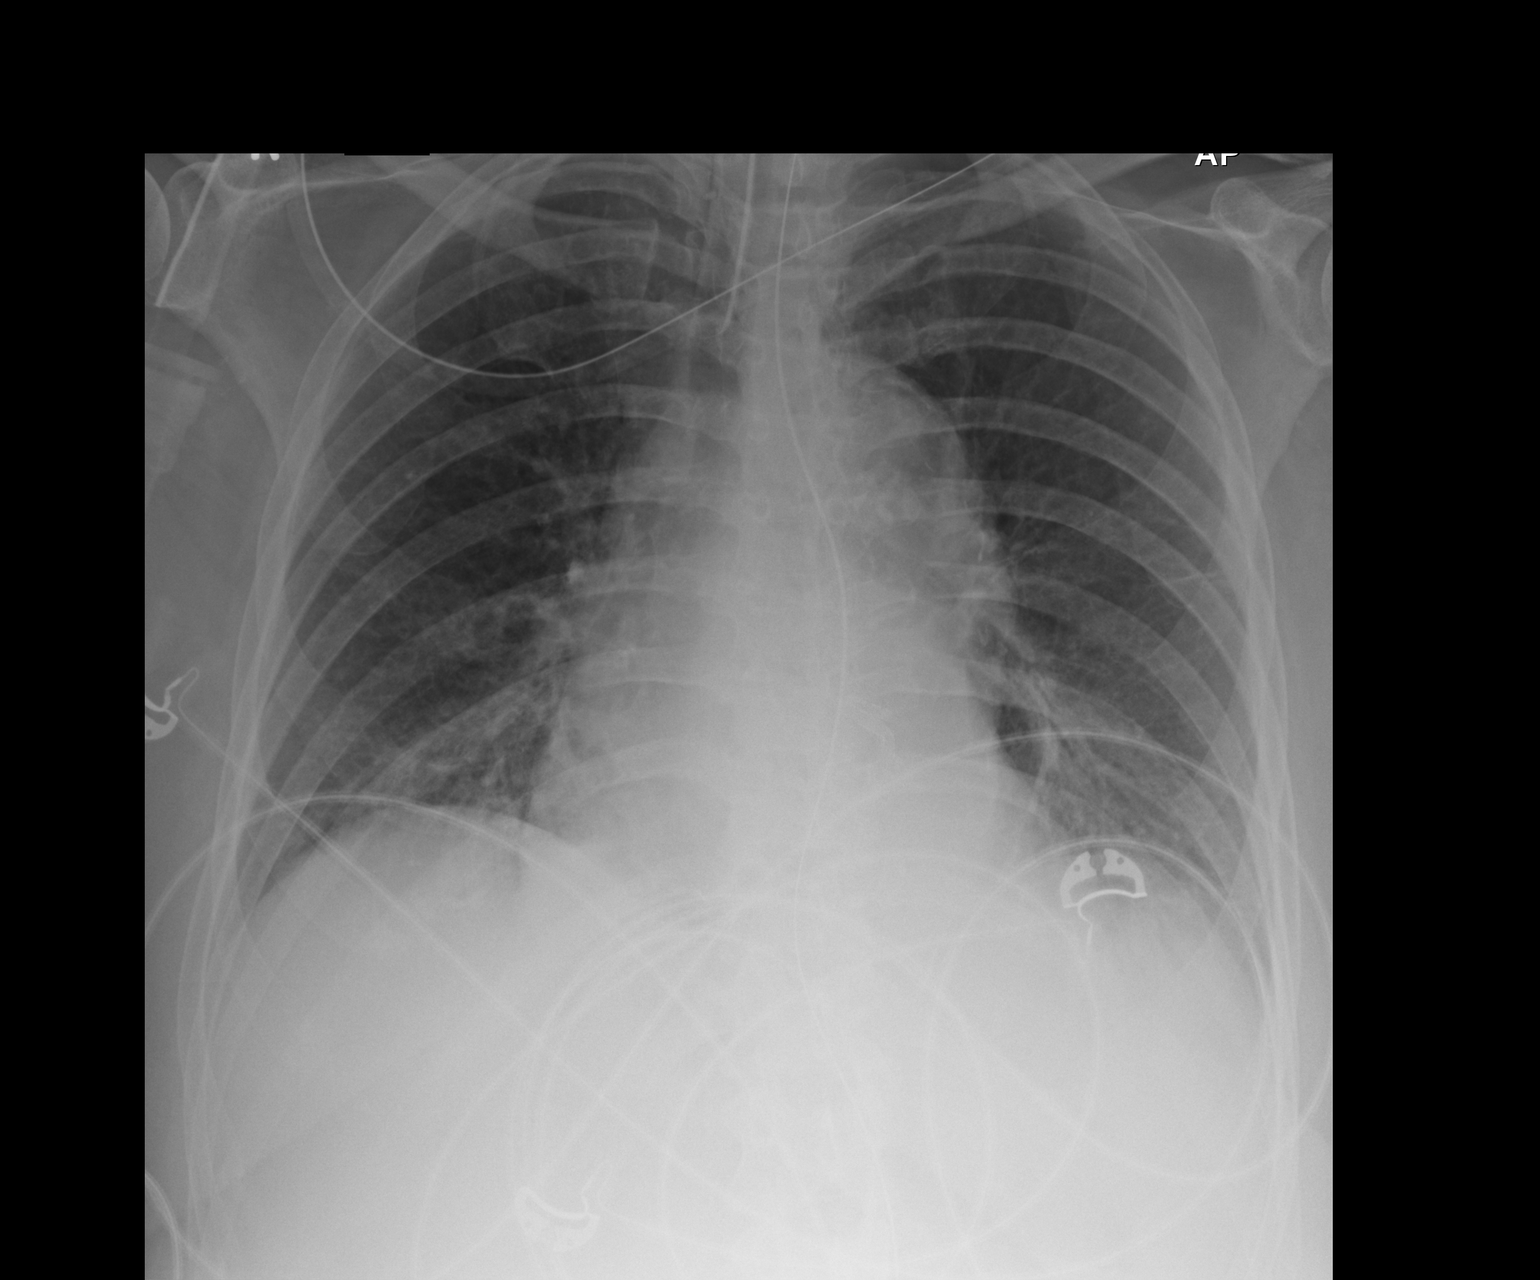

[1 of 1 positions shown; findings below may reference images not displayed]

FINDINGS: Aeration of the left lung base has improved slightly with mild
basilar atelectasis remaining. The tip of the endotracheal tube is
approximately 4.3 cm above the carina. NG tube extends below the
hemidiaphragm. Heart size is stable.
IMPRESSION: Improved aeration of the left lung base with mild basilar
atelectasis remaining
